# Patient Record
Sex: Female | Born: 1975 | Race: Black or African American | Hispanic: No | Marital: Single | Smoking: Former smoker
Health system: Southern US, Community
[De-identification: ages and names within clinical notes are randomized; demographics above are authoritative.]

## PROBLEM LIST (undated history)

## (undated) DIAGNOSIS — E785 Hyperlipidemia, unspecified: Secondary | ICD-10-CM

## (undated) DIAGNOSIS — E119 Type 2 diabetes mellitus without complications: Secondary | ICD-10-CM

## (undated) HISTORY — DX: Type 2 diabetes mellitus without complications: E11.9

## (undated) HISTORY — DX: Hyperlipidemia, unspecified: E78.5

## (undated) HISTORY — PX: KNEE ARTHROCENTESIS: SHX991

---

## 2001-07-25 ENCOUNTER — Other Ambulatory Visit: Admission: RE | Admit: 2001-07-25 | Discharge: 2001-07-25 | Payer: Self-pay | Admitting: Obstetrics & Gynecology

## 2002-08-18 ENCOUNTER — Other Ambulatory Visit: Admission: RE | Admit: 2002-08-18 | Discharge: 2002-08-18 | Payer: Self-pay | Admitting: Obstetrics & Gynecology

## 2002-08-25 ENCOUNTER — Ambulatory Visit (HOSPITAL_COMMUNITY): Admission: RE | Admit: 2002-08-25 | Discharge: 2002-08-25 | Payer: Self-pay | Admitting: Obstetrics & Gynecology

## 2002-08-25 ENCOUNTER — Encounter (INDEPENDENT_AMBULATORY_CARE_PROVIDER_SITE_OTHER): Payer: Self-pay | Admitting: *Deleted

## 2002-10-18 ENCOUNTER — Other Ambulatory Visit: Admission: RE | Admit: 2002-10-18 | Discharge: 2002-10-18 | Payer: Self-pay | Admitting: Obstetrics & Gynecology

## 2003-12-17 ENCOUNTER — Ambulatory Visit: Payer: Self-pay | Admitting: Obstetrics and Gynecology

## 2003-12-17 ENCOUNTER — Inpatient Hospital Stay (HOSPITAL_COMMUNITY): Admission: AD | Admit: 2003-12-17 | Discharge: 2003-12-20 | Payer: Self-pay | Admitting: Obstetrics and Gynecology

## 2004-01-03 ENCOUNTER — Ambulatory Visit: Payer: Self-pay | Admitting: Obstetrics and Gynecology

## 2005-06-14 ENCOUNTER — Emergency Department (HOSPITAL_COMMUNITY): Admission: EM | Admit: 2005-06-14 | Discharge: 2005-06-14 | Payer: Self-pay | Admitting: Emergency Medicine

## 2005-06-17 ENCOUNTER — Encounter: Admission: RE | Admit: 2005-06-17 | Discharge: 2005-06-17 | Payer: Self-pay | Admitting: Sports Medicine

## 2005-10-14 ENCOUNTER — Emergency Department (HOSPITAL_COMMUNITY): Admission: EM | Admit: 2005-10-14 | Discharge: 2005-10-14 | Payer: Self-pay | Admitting: Emergency Medicine

## 2008-03-14 ENCOUNTER — Inpatient Hospital Stay (HOSPITAL_COMMUNITY): Admission: AD | Admit: 2008-03-14 | Discharge: 2008-03-16 | Payer: Self-pay | Admitting: Obstetrics and Gynecology

## 2008-03-23 ENCOUNTER — Ambulatory Visit: Payer: Self-pay | Admitting: Obstetrics and Gynecology

## 2008-03-28 ENCOUNTER — Ambulatory Visit (HOSPITAL_COMMUNITY): Admission: RE | Admit: 2008-03-28 | Discharge: 2008-03-28 | Payer: Self-pay | Admitting: Obstetrics & Gynecology

## 2008-04-02 ENCOUNTER — Inpatient Hospital Stay (HOSPITAL_COMMUNITY): Admission: RE | Admit: 2008-04-02 | Discharge: 2008-04-09 | Payer: Self-pay | Admitting: Obstetrics

## 2008-04-06 ENCOUNTER — Encounter (INDEPENDENT_AMBULATORY_CARE_PROVIDER_SITE_OTHER): Payer: Self-pay | Admitting: Obstetrics & Gynecology

## 2010-03-10 ENCOUNTER — Ambulatory Visit: Payer: Self-pay | Admitting: Licensed Clinical Social Worker

## 2010-03-18 ENCOUNTER — Ambulatory Visit: Payer: Self-pay | Admitting: Licensed Clinical Social Worker

## 2010-03-25 ENCOUNTER — Ambulatory Visit: Payer: Self-pay | Admitting: Licensed Clinical Social Worker

## 2010-04-01 ENCOUNTER — Ambulatory Visit: Payer: Self-pay | Admitting: Licensed Clinical Social Worker

## 2010-04-08 ENCOUNTER — Ambulatory Visit: Payer: Self-pay | Admitting: Licensed Clinical Social Worker

## 2010-04-15 ENCOUNTER — Ambulatory Visit
Admission: RE | Admit: 2010-04-15 | Discharge: 2010-04-15 | Payer: Self-pay | Source: Home / Self Care | Attending: Licensed Clinical Social Worker | Admitting: Licensed Clinical Social Worker

## 2010-04-22 ENCOUNTER — Ambulatory Visit
Admission: RE | Admit: 2010-04-22 | Discharge: 2010-04-22 | Payer: Self-pay | Source: Home / Self Care | Attending: Licensed Clinical Social Worker | Admitting: Licensed Clinical Social Worker

## 2010-04-30 ENCOUNTER — Ambulatory Visit
Admission: RE | Admit: 2010-04-30 | Discharge: 2010-04-30 | Payer: Self-pay | Source: Home / Self Care | Attending: Licensed Clinical Social Worker | Admitting: Licensed Clinical Social Worker

## 2010-05-07 ENCOUNTER — Ambulatory Visit
Admission: RE | Admit: 2010-05-07 | Discharge: 2010-05-07 | Payer: Self-pay | Source: Home / Self Care | Attending: Licensed Clinical Social Worker | Admitting: Licensed Clinical Social Worker

## 2010-05-20 ENCOUNTER — Ambulatory Visit: Admit: 2010-05-20 | Payer: Self-pay | Admitting: Licensed Clinical Social Worker

## 2010-05-28 ENCOUNTER — Ambulatory Visit (INDEPENDENT_AMBULATORY_CARE_PROVIDER_SITE_OTHER): Payer: 59 | Admitting: Licensed Clinical Social Worker

## 2010-05-28 DIAGNOSIS — F411 Generalized anxiety disorder: Secondary | ICD-10-CM

## 2010-05-28 DIAGNOSIS — F331 Major depressive disorder, recurrent, moderate: Secondary | ICD-10-CM

## 2010-06-03 ENCOUNTER — Ambulatory Visit: Payer: 59 | Admitting: Licensed Clinical Social Worker

## 2010-06-03 DIAGNOSIS — F331 Major depressive disorder, recurrent, moderate: Secondary | ICD-10-CM

## 2010-06-03 DIAGNOSIS — F411 Generalized anxiety disorder: Secondary | ICD-10-CM

## 2010-06-11 ENCOUNTER — Ambulatory Visit (INDEPENDENT_AMBULATORY_CARE_PROVIDER_SITE_OTHER): Payer: 59 | Admitting: Licensed Clinical Social Worker

## 2010-06-11 DIAGNOSIS — F411 Generalized anxiety disorder: Secondary | ICD-10-CM

## 2010-06-11 DIAGNOSIS — F331 Major depressive disorder, recurrent, moderate: Secondary | ICD-10-CM

## 2010-06-24 ENCOUNTER — Ambulatory Visit (INDEPENDENT_AMBULATORY_CARE_PROVIDER_SITE_OTHER): Payer: 59 | Admitting: Licensed Clinical Social Worker

## 2010-06-24 DIAGNOSIS — F331 Major depressive disorder, recurrent, moderate: Secondary | ICD-10-CM

## 2010-06-24 DIAGNOSIS — F411 Generalized anxiety disorder: Secondary | ICD-10-CM

## 2010-07-08 ENCOUNTER — Ambulatory Visit (INDEPENDENT_AMBULATORY_CARE_PROVIDER_SITE_OTHER): Payer: 59 | Admitting: Licensed Clinical Social Worker

## 2010-07-08 DIAGNOSIS — F411 Generalized anxiety disorder: Secondary | ICD-10-CM

## 2010-07-08 DIAGNOSIS — F331 Major depressive disorder, recurrent, moderate: Secondary | ICD-10-CM

## 2010-07-15 ENCOUNTER — Other Ambulatory Visit: Payer: Self-pay | Admitting: Obstetrics and Gynecology

## 2010-09-02 NOTE — Op Note (Signed)
Angelica Berg, Angelica Berg              ACCOUNT NO.:  [id number]   MEDICAL RECORD NO.:  [id number]          PATIENT TYPE:  INP   LOCATION:  9319                          FACILITY:  [facility]   PHYSICIAN:  [name], M.D.DATE OF BIRTH:  1975/10/06   DATE OF PROCEDURE:  DATE OF DISCHARGE:                               OPERATIVE REPORT   PREOPERATIVE DIAGNOSES:  A 31 weeks and 6 days gestation twin,  diamniotic, dichorionic preterm rupture of membranes, preterm labor,  intrauterine growth restriction baby B.  Presentation breech and  transverse.   POSTOPERATIVE DIAGNOSES:  A 31 weeks and 6 days gestation twin,  diamniotic, dichorionic preterm rupture of membranes, preterm labor,  intrauterine growth restriction baby B.  Presentation breech and  transverse.   PROCEDURE:  Urgent primary low transverse cesarean section.   SURGEON:  [name], MD   ASSISTANT:  Eliberto Ivory. Rosalio Macadamia, MD   ANESTHESIOLOGIST:  Angelica Pou, MD   DESCRIPTION OF PROCEDURE:  Under spinal anesthesia, the patient is in 15  degree left decubitus position.  She is prepped with Betadine on the  abdominal, suprapubic, vulvar and vaginal areas.  The Foley is put in  place in the bladder and the patient is draped as usual.  The level of  anesthesia is verified and is adequate.  We infiltrate the subcutaneous  tissue with Marcaine one-quarter plain 10 mL at the site of the  Pfannenstiel.  We make a Pfannenstiel with the scalpel.  We opened the  adipose tissue with the electrocautery, cutting mode.  We assured  hemostasis with the electrocautery coag mode.  We opened the aponeurosis  transversely with the electrocautery and complete on each side with Mayo  scissors.  We separate the recti muscles from the aponeurosis on the  midline superiorly and inferiorly.  We opened the parietal peritoneum  bluntly with the finger.  We then put the bladder retractor in place.  We opened the visceral peritoneum with  Metzenbaum scissors.  We reclined  the bladder downward, repositioned the bladder retractor.  We have a  good lower uterine segment, therefore we make a low-transverse  hysterotomy with the scalpel, extension on each side with dressing  scissors.  Birth of baby A in breech presentation, it is a baby girl.  The cord is clamped and cut, the baby is suctioned and given to the  neonatal team.  Apgars are 7 and 9.  Birth time 9:36, pH is pending.  The second twin is in transverse presentation.  We feel for the feet and  once they are grasped, the membranes are ruptured.  Birth of baby B in  breech presentation, it is a baby girl born at 9:38.  The cord is  clamped and cut, the baby is suctioned and given to the neonatal team.  Apgars are 5 and 9 and the pH is pending.  We then extract the placenta  manually after taking the cord blood.  We sent the placenta to  Pathology.  Uterine revision is done.  Pitocin is started and the IV  fluids and the uterus contracts well.  It is  normal in size and  appearance.  Both tubes and both ovaries are normal in appearance.  We  closed the hysterotomy in a first locked running suture of Vicryl 0 and  a second layer in a mattress fashion is done with Vicryl 0.  A figure-of-  eight on the right angle is added to complete hemostasis.  Hemostasis is  adequate at all levels on the uterus.  We complete hemostasis with the  electrocautery coag mode on the recti muscles and around the bladder  area.  We irrigated and suctioned the abdominopelvic cavities.  We then  closed the aponeurosis in two half running sutures of Vicryl zero.  We  complete hemostasis on the adipose tissue with the electrocautery.  We  do separate stitches of plain 3-0 on the adipose tissue  and reapproximate the skin with staples.  The counts of sponges and  instrument was complete x2.  A compressive dressing is put on the  incision.  The estimated blood loss was 600 mL.  No complications   occurred and the patient was brought to recovery room in good stable  status.  Note that a dose of Flagyl IV was given after cord clamping.      [name], M.D.  Electronically Signed     ML/MEDQ  D:  04/06/2008  T:  04/07/2008  Job:  [id number]

## 2010-09-05 NOTE — Op Note (Signed)
   NAME:  Angelica Berg, Angelica Berg                        ACCOUNT NO.:  [id number]   MEDICAL RECORD NO.:  [id number]                   PATIENT TYPE:  SPE   LOCATION:  DFTL                                 FACILITY:  [facility]   PHYSICIAN:  [name], M.D.             DATE OF BIRTH:  05/20/1975   DATE OF PROCEDURE:  08/25/2002  DATE OF DISCHARGE:  08/18/2002                                 OPERATIVE REPORT   PREOPERATIVE DIAGNOSES:  1. Intrauterine pregnancy at 6+ weeks.  2. Incomplete spontaneous abortion.   POSTOPERATIVE DIAGNOSES:  1. Intrauterine pregnancy at 6+ weeks.  2. Incomplete spontaneous abortion.   PROCEDURE:  Dilatation and evacuation.   SURGEON:  [name], M.D.   ANESTHESIOLOGIST:  Raul Del, M.D.   DESCRIPTION OF PROCEDURE:  Under general anesthesia with O2 mask, the  patient was in the supine position.  She was prepped with Betadine in the  suprapubic, vulvar and vaginal areas and draped as usual.  The vaginal exam  revealed a retroverted uterus about 7 cm, no adnexal mass.  The cervix is  fingertip and long.  No vaginal bleeding currently.  The speculum was  inserted.  The anterior lip of the cervix was grasped with a tenaculum.  Dilatation was done with Hegar dilators up to #33 easily.  A #9 curet was  used for suction.  Products of conception corresponding to about 7 weeks'  gestation are present and sent to pathology.  The uterus contracts well.  The intrauterine cavity was then curetted with the sharp curet over all  surfaces, this one particularly.  Hemostasis is adequate.  The suction curet  is used to remove any blood clot.  The instruments are all removed.  No  complications occurred, and the patient was brought back to the recovery  room in good status.  Her blood group is B positive.                                               [name], M.D.    ML/MEDQ  D:  08/25/2002  T:  08/27/2002  Job:  [id number]

## 2010-09-05 NOTE — Discharge Summary (Signed)
NAME:  Angelica Berg, Angelica Berg                        ACCOUNT NO.:  [id number]   MEDICAL RECORD NO.:  [id number]                   PATIENT TYPE:  INP   LOCATION:  9309                                 FACILITY:  [facility]   PHYSICIAN:  Phil D. Okey Dupre, M.D.                  DATE OF BIRTH:  [birth year]   DATE OF ADMISSION:  12/17/2003  DATE OF DISCHARGE:  12/20/2003                                 DISCHARGE SUMMARY   ADMISSION DIAGNOSES:  1.  Fever.  2.  Pelvic tenderness.   DISCHARGE DIAGNOSES:  1.  Questionable pelvic inflammatory disease with positive chlamydia and      free fluid on ultrasound.  2.  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   DISCHARGE MEDICATIONS:  1.  Doxycycline 100 mg b.i.d. x10 days.  2.  Acyclovir 400 mg t.i.d. x10 days.  3.  Tylenol 650 mg q.4-6h. p.r.n.   ADMISSION HISTORY:  [name] presented to maternity admissions after being  seen in STD clinic where she was concerned about sexually-transmitted  diseases.  On exam during the STD clinic she was noted to have some  tenderness and was sent to maternity admissions.  On presenting to maternity  admissions she had a fever to 102 with some pelvic tenderness.  Her cervix  was noted to be friable.  Gonorrhea, chlamydia, and HSV cultures were done.  Chlamydia and HSV were both noted to be positive.  The patient remained  febrile on Zosyn for 24 hours.  She was changed from Zosyn to ampicillin,  gentamycin, and clindamycin.  She still continued to spike fevers to 101 for  the first 12 hours.  Valtrex was then added to that regimen after her HSV  became positive and she defervesced.  The last 24 hours prior to discharge  she had one low-grade temperature of 100.7 and remained afebrile following  that.   On day of discharge she had no tenderness and was tolerating p.o. well.   CONDITION ON DISCHARGE:  The patient was discharged to home in stable  condition.   INSTRUCTIONS GIVEN TO PATIENT:  The  patient was told of her above medical  regimen.  She will be set up with the GYN clinic for an appointment prior to  her discharge.   Of note, her white blood cell count was 3.4 on the day prior to discharge.  An HIV was checked which was negative.  She also had hepatitis panels which  are pending on day of discharge and these need to be followed up in the GYN  clinic.     Jon Gills, M.D.                     Phil D. Okey Dupre, M.D.   LC/MEDQ  D:  12/20/2003  T:  12/21/2003  Job:  [id number]   cc:   [facility] Dr. Okey Dupre at [facility]

## 2010-09-05 NOTE — Discharge Summary (Signed)
NAMEELIOT, BENCIVENGA              ACCOUNT NO.:  [id number]   MEDICAL RECORD NO.:  [id number]           PATIENT TYPE:   LOCATION:                                 FACILITY:   PHYSICIAN:  [name], M.D.DATE OF BIRTH:  11-06-1975   DATE OF ADMISSION:  03/27/2008  DATE OF DISCHARGE:  04/09/2008                               DISCHARGE SUMMARY   ADMISSION DIAGNOSES:  A 31 weeks and 6 days twin, diamniotic-dichorionic  with preterm rupture of membranes, preterm labor, and intrauterine  growth restriction on baby B.   DISCHARGE DIAGNOSES:  A 31 weeks and 6 days twin, diamniotic-dichorionic  with preterm rupture of membranes, preterm labor, and intrauterine  growth restriction on baby B.  Presentation was breech and transverse.   PROCEDURE:  Urgent primary low transverse cesarean section.   HOSPITAL COURSE:  The patient had 2 girls.  Apgars were 7 and 9 and 5  and 9.  Estimated blood loss was 600 mL.  No complications occurred  during the surgery.  The patient had a good postop evaluation.  She was  discharged in stable status on April 09, 2008.  On postop day #3, she  was prescribed Tylox p.r.n.  Postop advice were given.  She will follow  up at [facility] in 6 weeks.  Her postop hemoglobin was 11.6.      [name], M.D.  Electronically Signed     ML/MEDQ  D:  05/31/2008  T:  06/01/2008  Job:  [id number]

## 2011-01-19 ENCOUNTER — Emergency Department (HOSPITAL_COMMUNITY): Payer: Self-pay

## 2011-01-19 ENCOUNTER — Emergency Department (HOSPITAL_COMMUNITY)
Admission: EM | Admit: 2011-01-19 | Discharge: 2011-01-19 | Disposition: A | Discharge status: Home / Self Care | Payer: Self-pay | Attending: Emergency Medicine | Admitting: Emergency Medicine

## 2011-01-19 DIAGNOSIS — R229 Localized swelling, mass and lump, unspecified: Secondary | ICD-10-CM | POA: Insufficient documentation

## 2011-01-19 DIAGNOSIS — M25579 Pain in unspecified ankle and joints of unspecified foot: Secondary | ICD-10-CM | POA: Insufficient documentation

## 2011-01-19 DIAGNOSIS — W108XXA Fall (on) (from) other stairs and steps, initial encounter: Secondary | ICD-10-CM | POA: Insufficient documentation

## 2011-01-20 LAB — CREATININE CLEARANCE, URINE, 24 HOUR
Collection Interval-CRCL: 24
Creatinine Clearance: 223 — ABNORMAL HIGH
Creatinine, 24H Ur: 1509

## 2011-01-20 LAB — CBC
HCT: 36.4
HCT: 36.9
Hemoglobin: 12.4
Hemoglobin: 12.6
MCHC: 34.2
MCV: 97
MCV: 97.3
Platelets: 223
RBC: 3.74 — ABNORMAL LOW
RBC: 3.81 — ABNORMAL LOW
RDW: 13.9
WBC: 9.9

## 2011-01-20 LAB — COMPREHENSIVE METABOLIC PANEL
BUN: 3 — ABNORMAL LOW
CO2: 24
Calcium: 9.1
Creatinine, Ser: 0.47
GFR calc non Af Amer: >60
Glucose, Bld: 108 — ABNORMAL HIGH
Total Protein: 6.1

## 2011-01-20 LAB — LACTATE DEHYDROGENASE: LDH: 151

## 2011-01-20 LAB — PROTEIN, URINE, 24 HOUR
Protein, 24H Urine: 195 — ABNORMAL HIGH
Protein, Urine: 15
Urine Total Volume-UPROT: 1300

## 2011-01-20 LAB — GLUCOSE, 1 HOUR GESTATIONAL: Glucose Tolerance, 1 hour: 166

## 2011-01-20 LAB — GLUCOSE, 2 HOUR GESTATIONAL: Glucose Tolerance, 2 hour: 159

## 2011-01-20 LAB — URIC ACID: Uric Acid, Serum: 4.5

## 2011-01-22 LAB — TYPE AND SCREEN
ABO/RH(D): B POS
Antibody Screen: NEGATIVE

## 2011-01-22 LAB — COMPREHENSIVE METABOLIC PANEL
ALT: 16 U/L (ref 0–35)
Albumin: 2.7 g/dL — ABNORMAL LOW (ref 3.5–5.2)
Alkaline Phosphatase: 80 U/L (ref 39–117)
BUN: 3 mg/dL — ABNORMAL LOW (ref 6–23)
CO2: 27 mEq/L (ref 19–32)
Calcium: 8.4 mg/dL (ref 8.4–10.5)
Calcium: 8.8 mg/dL (ref 8.4–10.5)
GFR calc non Af Amer: >60 mL/min (ref >60)
Glucose, Bld: 109 mg/dL — ABNORMAL HIGH (ref 70–99)
Glucose, Bld: 119 mg/dL — ABNORMAL HIGH (ref 70–99)
Potassium: 3.3 mEq/L — ABNORMAL LOW (ref 3.5–5.1)
Potassium: 3.6 mEq/L (ref 3.5–5.1)
Sodium: 131 mEq/L — ABNORMAL LOW (ref 135–145)
Sodium: 133 mEq/L — ABNORMAL LOW (ref 135–145)
Total Protein: 6.3 g/dL (ref 6.0–8.3)

## 2011-01-22 LAB — DIFFERENTIAL
Basophils Relative: 0 % (ref 0–1)
Lymphocytes Relative: 33 % (ref 12–46)
Monocytes Relative: 8 % (ref 3–12)
Neutro Abs: 5.2 10*3/uL (ref 1.7–7.7)
Neutrophils Relative %: 58 % (ref 43–77)

## 2011-01-22 LAB — PROTEIN, URINE, 24 HOUR
Collection Interval-UPROT: 24 hours
Protein, 24H Urine: 193 mg/d — ABNORMAL HIGH (ref 50–100)
Protein, Urine: 14 mg/dL

## 2011-01-22 LAB — CBC
HCT: 37.6 % (ref 36.0–46.0)
Hemoglobin: 11.6 g/dL — ABNORMAL LOW (ref 12.0–15.0)
Hemoglobin: 13 g/dL (ref 12.0–15.0)
MCHC: 34.1 g/dL (ref 30.0–36.0)
MCHC: 34.2 g/dL (ref 30.0–36.0)
MCHC: 34.5 g/dL (ref 30.0–36.0)
Platelets: 192 10*3/uL (ref 150–400)
RBC: 3.47 MIL/uL — ABNORMAL LOW (ref 3.87–5.11)
RBC: 4.05 MIL/uL (ref 3.87–5.11)
RDW: 13.7 % (ref 11.5–15.5)
WBC: 8.9 10*3/uL (ref 4.0–10.5)

## 2011-01-22 LAB — ABO/RH: ABO/RH(D): B POS

## 2011-01-22 LAB — CREATININE CLEARANCE, URINE, 24 HOUR
Creatinine, 24H Ur: 1561 mg/d (ref 700–1800)
Urine Total Volume-CRCL: 1375 mL

## 2011-01-22 LAB — URIC ACID: Uric Acid, Serum: 5.4 mg/dL (ref 2.4–7.0)

## 2011-10-12 ENCOUNTER — Emergency Department (INDEPENDENT_AMBULATORY_CARE_PROVIDER_SITE_OTHER)
Admission: EM | Admit: 2011-10-12 | Discharge: 2011-10-12 | Disposition: A | Discharge status: Home / Self Care | Payer: Self-pay | Source: Home / Self Care | Attending: Emergency Medicine | Admitting: Emergency Medicine

## 2011-10-12 ENCOUNTER — Encounter (HOSPITAL_COMMUNITY): Payer: Self-pay

## 2011-10-12 DIAGNOSIS — N760 Acute vaginitis: Secondary | ICD-10-CM

## 2011-10-12 DIAGNOSIS — R5383 Other fatigue: Secondary | ICD-10-CM

## 2011-10-12 DIAGNOSIS — N730 Acute parametritis and pelvic cellulitis: Secondary | ICD-10-CM

## 2011-10-12 DIAGNOSIS — B9689 Other specified bacterial agents as the cause of diseases classified elsewhere: Secondary | ICD-10-CM

## 2011-10-12 DIAGNOSIS — M778 Other enthesopathies, not elsewhere classified: Secondary | ICD-10-CM

## 2011-10-12 DIAGNOSIS — A499 Bacterial infection, unspecified: Secondary | ICD-10-CM

## 2011-10-12 LAB — POCT I-STAT, CHEM 8
Calcium, Ion: 1.2 mmol/L (ref 1.12–1.32)
Glucose, Bld: 111 mg/dL — ABNORMAL HIGH (ref 70–99)
HCT: 47 % — ABNORMAL HIGH (ref 36.0–46.0)
Hemoglobin: 16 g/dL — ABNORMAL HIGH (ref 12.0–15.0)

## 2011-10-12 LAB — POCT URINALYSIS DIP (DEVICE)
Leukocytes, UA: NEGATIVE
Nitrite: NEGATIVE
Protein, ur: NEGATIVE mg/dL
pH: 7 (ref 5.0–8.0)

## 2011-10-12 MED ORDER — AZITHROMYCIN 250 MG PO TABS
1000.0000 mg | ORAL_TABLET | Freq: Once | ORAL | Status: AC
  Administered 2011-10-12: 1000 mg via ORAL

## 2011-10-12 MED ORDER — LIDOCAINE HCL (PF) 1 % IJ SOLN
INTRAMUSCULAR | Status: AC
  Filled 2011-10-12: qty 5

## 2011-10-12 MED ORDER — CEFTRIAXONE SODIUM 250 MG IJ SOLR
INTRAMUSCULAR | Status: AC
  Filled 2011-10-12: qty 250

## 2011-10-12 MED ORDER — MELOXICAM 15 MG PO TABS: 15.0000 mg | ORAL_TABLET | Freq: Every day | ORAL | Status: AC

## 2011-10-12 MED ORDER — AZITHROMYCIN 250 MG PO TABS
ORAL_TABLET | ORAL | Status: AC
  Filled 2011-10-12: qty 4

## 2011-10-12 MED ORDER — METRONIDAZOLE 500 MG PO TABS: 500.0000 mg | ORAL_TABLET | Freq: Three times a day (TID) | ORAL | Status: AC

## 2011-10-12 MED ORDER — CEFTRIAXONE SODIUM 250 MG IJ SOLR
250.0000 mg | Freq: Once | INTRAMUSCULAR | Status: AC
  Administered 2011-10-12: 250 mg via INTRAMUSCULAR

## 2011-10-12 NOTE — ED Notes (Signed)
States she had intercourse after ending menses, then menses started again; c/o she feels bad, having lower abdominal area pain; febrile; c/o pain in right shoulder

## 2011-10-12 NOTE — ED Provider Notes (Signed)
Chief Complaint  Patient presents with  . Abdominal Pain    History of Present Illness:    The patient is a 36 year old female with a two-week history of bilateral lower abdominal pain. This would come and go and was rated 5/10 in intensity. Nothing particular made it worse it was relieved by Aleve. The patient notes anorexia and nausea but no vomiting, fever, or chills. She has had some discharge the past couple days which is clear to yellow little bit of blood. She has some odor. She has a history of bacterial vaginosis and herpes simplex virus. She notes her menses have been irregular. Her last menstrual period began Saturday a week ago and lasted 5 days. It was heavy with clotting and cramping she is sexually active and uses condoms occasionally but not consistently. The past 2 weeks she's felt tired and rundown and hasn't had much energy. She also has noted a knot on her right shoulder with radiation of pain into her upper back and into her right breast.  Review of Systems:  Other than noted above, the patient denies any of the following symptoms: Constitutional:  No fever, chills, fatigue, weight loss or anorexia. Lungs:  No cough or shortness of breath. Heart:  No chest pain, palpitations, syncope or edema.  No cardiac history. Abdomen:  No nausea, vomiting, hematememesis, melena, diarrhea, or hematochezia. GU:  No dysuria, frequency, urgency, or hematuria. Gyn:  No vaginal discharge, itching, abnormal bleeding, dyspareunia, or pelvic pain. Skin:  No rash or itching.  PMFSH:  Past medical history, family history, social history, meds, and allergies were reviewed along with nurse's notes.  No prior abdominal surgeries, past history of GI problems, STDs or GYN problems.  No history of aspirin or NSAID use.  No excessive alcohol intake.  Physical Exam:   Vital signs:  BP 135/85  Pulse 78  Temp 99.2 F (37.3 C) (Oral)  Resp 16  SpO2 98% Gen:  Alert, oriented, in no distress. Lungs:   Breath sounds clear and equal bilaterally.  No wheezes, rales or rhonchi. Heart:  Regular rhythm.  No gallops or murmers.   Abdomen:  Soft, flat, nontender and nondistended. She has mild tenderness to palpation in both lower quadrants without guarding or rebound. No organomegaly or mass. Bowel sounds are normally active. Pelvic:  Normal external genitalia, vaginal and cervical mucosa are normal. There is a small amount of whitish discharge. She has moderate cervical motion tenderness. Uterus is midposition and normal in size and shape. She has moderate uterine tenderness. She is moderate bilateral adnexal tenderness without any masses. Skin:  Clear, warm and dry.  No rash.  Labs:   Results for orders placed during the hospital encounter of 10/12/11  WET PREP, GENITAL      Component Value Range   Yeast Wet Prep HPF POC NONE SEEN  NONE SEEN   Trich, Wet Prep NONE SEEN  NONE SEEN   Clue Cells Wet Prep HPF POC MODERATE (*) NONE SEEN   WBC, Wet Prep HPF POC FEW (*) NONE SEEN  POCT URINALYSIS DIP (DEVICE)      Component Value Range   Glucose, UA NEGATIVE  NEGATIVE mg/dL   Bilirubin Urine SMALL (*) NEGATIVE   Ketones, ur NEGATIVE  NEGATIVE mg/dL   Specific Gravity, Urine 1.020  1.005 - 1.030   Hgb urine dipstick NEGATIVE  NEGATIVE   pH 7.0  5.0 - 8.0   Protein, ur NEGATIVE  NEGATIVE mg/dL   Urobilinogen, UA 0.2  0.0 -  1.0 mg/dL   Nitrite NEGATIVE  NEGATIVE   Leukocytes, UA NEGATIVE  NEGATIVE  POCT PREGNANCY, URINE      Component Value Range   Preg Test, Ur NEGATIVE  NEGATIVE  POCT I-STAT, CHEM 8      Component Value Range   Sodium 142  135 - 145 mEq/L   Potassium 3.8  3.5 - 5.1 mEq/L   Chloride 100  96 - 112 mEq/L   BUN 5 (*) 6 - 23 mg/dL   Creatinine, Ser 8.11  0.50 - 1.10 mg/dL   Glucose, Bld 914 (*) 70 - 99 mg/dL   Calcium, Ion 7.82  1.12 - 1.32 mmol/L   TCO2 28  0 - 100 mmol/L   Hemoglobin 16.0 (*) 12.0 - 15.0 g/dL   HCT 95.6 (*) 21.3 - 08.6 %    Other Labs Obtained at Urgent  Care Center:  GC and Chlamydia DNA probes were obtained.  Results are pending at this time and we will call about any positive results.  Course in Urgent Care Center:   She was given Rocephin 250 mg IM and azithromycin 1000 mg by mouth and tolerated these well without any immediate side effects.  Assessment:  The primary encounter diagnosis was PID (acute pelvic inflammatory disease). Diagnoses of Fatigue, Bacterial vaginosis, and Tendonitis of shoulder were also pertinent to this visit.  Plan:   1.  The following meds were prescribed:   New Prescriptions   MELOXICAM (MOBIC) 15 MG TABLET    Take 1 tablet (15 mg total) by mouth daily.   METRONIDAZOLE (FLAGYL) 500 MG TABLET    Take 1 tablet (500 mg total) by mouth 3 (three) times daily.   2.  The patient was instructed in symptomatic care and handouts were given. 3.  The patient was told to return if becoming worse in any way, if no better in 3 or 4 days, and given some red flag symptoms that would indicate earlier return.  Follow up:  The patient was told to follow up with Dr.Varnado in one week.     Reuben Likes, MD 10/12/11 [phone]

## 2011-10-12 NOTE — Discharge Instructions (Signed)
Pelvic Inflammatory Disease  Pelvic Inflammatory Disease (PID) is an infection in some or all of your female organs. This includes the womb (uterus), ovaries, fallopian tubes and tissues in the pelvis. PID is a common cause of sudden onset (acute) lower abdominal (pelvic) pain. PID can be treated, but it is a serious infection. It may take weeks before you are completely well. In some cases, hospitalization is needed for surgery or to administer medications to kill germs (antibiotics) through your veins (intravenously).  CAUSES    It may be caused by germs that are spread during sexual contact.   PID can also occur following:   The birth of a baby.   A miscarriage.   An abortion.   Major surgery of the pelvis.   Use of an IUD.   Sexual assault.  SYMPTOMS    Abdominal or pelvic pain.   Fever.   Chills.   Abnormal vaginal discharge.  DIAGNOSIS   Your caregiver will choose some of these methods to make a diagnosis:   A physical exam and history.   Blood tests.   Cultures of the vagina and cervix.   X-rays or ultrasound.   A procedure to look inside the pelvis (laparoscopy).  TREATMENT    Use of antibiotics by mouth or intravenously.   Treatment of sexual partners when the infection is an sexually transmitted disease (STD).   Hospitalization and surgery may be needed.  RISKS AND COMPLICATIONS    PID can cause women to become unable to have children (sterile) if left untreated or if partially treated. That is why it is important to finish all medications given to you.   Sterility or future tubal (ectopic) pregnancies can occur in fully treated individuals. This is why it is so important to follow your prescribed treatment.   It can cause longstanding (chronic) pelvic pain after frequent infections.   Painful intercourse.   Pelvic abscesses.   In rare cases, surgery or a hysterectomy may be needed.   If this is a sexually transmitted infection (STI), you are also at risk for any other STD  including AIDSor human papillomavirus (HPV).  HOME CARE INSTRUCTIONS    Finish all medication as prescribed. Incomplete treatment will put you at risk for sterility and tubal pregnancy.   Only take over-the-counter or prescription medicines for pain, discomfort, or fever as directed by your caregiver.   Do not have sex until treatment is completed or as directed by your caregiver. If PID is confirmed, your recent sexual contacts will need treatment.   Keep your follow-up appointments.  SEEK MEDICAL CARE IF:    You have increased or abnormal vaginal discharge.   You need prescription medication for your pain.   Your partner has an STD.   You are vomiting.   You cannot take your medications.  SEEK IMMEDIATE MEDICAL CARE IF:    You have a fever.   You develop increased abdominal or pelvic pain.   You develop chills.   You have pain when you urinate.   You are not better after 72 hours following treatment.  Document Released: 04/06/2005 Document Revised: 03/26/2011 Document Reviewed: 12/18/2006  [employer] Patient Information 2012 [employer].

## 2011-10-13 LAB — URINE CULTURE
Colony Count: 65000
Culture  Setup Time: 201306241333

## 2011-10-13 LAB — GC/CHLAMYDIA PROBE AMP, GENITAL
Chlamydia, DNA Probe: NEGATIVE
GC Probe Amp, Genital: NEGATIVE

## 2011-10-13 NOTE — ED Notes (Signed)
GC/Chlamydia neg., Wet prep: Mod. clue cells, few WBC's, Urine culture: 65,000 colonies multiple bacterial types none predominant.   Pt. adequately treated with Flagyl. Vassie Moselle 10/13/2011

## 2018-08-09 ENCOUNTER — Other Ambulatory Visit: Payer: Self-pay

## 2018-08-11 ENCOUNTER — Encounter: Payer: Self-pay | Admitting: Obstetrics & Gynecology

## 2018-08-11 ENCOUNTER — Other Ambulatory Visit: Payer: Self-pay

## 2018-08-11 ENCOUNTER — Ambulatory Visit (INDEPENDENT_AMBULATORY_CARE_PROVIDER_SITE_OTHER): Payer: PRIVATE HEALTH INSURANCE | Admitting: Obstetrics & Gynecology

## 2018-08-11 VITALS — BP 140/90 | Ht 69.5 in | Wt 256.0 lb

## 2018-08-11 DIAGNOSIS — N611 Abscess of the breast and nipple: Secondary | ICD-10-CM

## 2018-08-11 DIAGNOSIS — Z01419 Encounter for gynecological examination (general) (routine) without abnormal findings: Secondary | ICD-10-CM

## 2018-08-11 DIAGNOSIS — E6609 Other obesity due to excess calories: Secondary | ICD-10-CM | POA: Diagnosis not present

## 2018-08-11 DIAGNOSIS — L292 Pruritus vulvae: Secondary | ICD-10-CM

## 2018-08-11 DIAGNOSIS — Z1151 Encounter for screening for human papillomavirus (HPV): Secondary | ICD-10-CM | POA: Diagnosis not present

## 2018-08-11 DIAGNOSIS — N911 Secondary amenorrhea: Secondary | ICD-10-CM | POA: Diagnosis not present

## 2018-08-11 DIAGNOSIS — Z6837 Body mass index (BMI) 37.0-37.9, adult: Secondary | ICD-10-CM

## 2018-08-11 MED ORDER — FLUCONAZOLE 150 MG PO TABS: 150.0000 mg | ORAL_TABLET | Freq: Every day | ORAL | 3 refills | Status: AC

## 2018-08-11 MED ORDER — CEPHALEXIN 500 MG PO CAPS: 500.0000 mg | ORAL_CAPSULE | Freq: Three times a day (TID) | ORAL | 0 refills | Status: AC

## 2018-08-11 NOTE — Progress Notes (Signed)
Angelica Berg November 09, 1975 [id number]   History:    43 y.o. G2P1A1L2  Single.  Twin girls 43 yo.  RP:  New (>3 yrs) patient presenting for annual gyn exam   HPI: Amenorrhea x 8 months with hot flushes.  No pelvic pain.  Abstinent x >1 year.  Urine/BMs normal.  Left breast sore lump with redness x a few weeks.  Vulvar itching.  BMI 37.26.  Not exercising regularly.  Starting a low Calorie/Carb diet.  Fasting health labs here today.  Past medical history,surgical history, family history and social history were all reviewed and documented in the EPIC chart.  Gynecologic History No LMP recorded. (Menstrual status: Irregular Periods). Contraception: abstinence Last Pap: 06/2010 Negative Last mammogram: Never Bone Density: Never Colonoscopy: Never  Obstetric History OB History  Gravida Para Term Preterm AB Living  _0 SAB TAB Ectopic Multiple Live Births  1            # Outcome Date GA Lbr Len/2nd Weight Sex Delivery Anes PTL Lv  2 SAB           1 Para              ROS: A ROS was performed and pertinent positives and negatives are included in the history.  GENERAL: No fevers or chills. HEENT: No change in vision, no earache, sore throat or sinus congestion. NECK: No pain or stiffness. CARDIOVASCULAR: No chest pain or pressure. No palpitations. PULMONARY: No shortness of breath, cough or wheeze. GASTROINTESTINAL: No abdominal pain, nausea, vomiting or diarrhea, melena or bright red blood per rectum. GENITOURINARY: No urinary frequency, urgency, hesitancy or dysuria. MUSCULOSKELETAL: No joint or muscle pain, no back pain, no recent trauma. DERMATOLOGIC: No rash, no itching, no lesions. ENDOCRINE: No polyuria, polydipsia, no heat or cold intolerance. No recent change in weight. HEMATOLOGICAL: No anemia or easy bruising or bleeding. NEUROLOGIC: No headache, seizures, numbness, tingling or weakness. PSYCHIATRIC: No depression, no loss of interest in normal activity or change in  sleep pattern.     Exam:   BP 140/90    Ht 5' 9.5" (1.765 m)    Wt 256 lb (116.1 kg)    BMI 37.26 kg/m   Body mass index is 37.26 kg/m.  General appearance : Well developed well nourished female. No acute distress HEENT: Eyes: no retinal hemorrhage or exudates,  Neck supple, trachea midline, no carotid bruits, no thyroidmegaly Lungs: Clear to auscultation, no rhonchi or wheezes, or rib retractions  Heart: Regular rate and rhythm, no murmurs or gallops Breast:Examined in sitting and supine position.  Right breast with no palpable masses or tenderness,  no skin retraction, no nipple inversion, no nipple discharge, no skin discoloration, no axillary or supraclavicular lymphadenopathy.  Left breast: Large mass/abscess measuring about 4 x 5 cm at 7:00 with fluctuation and erythema of the skin.  After verbal consent, Betadine prep with local anesthesia using lidocaine 2%.  A 20 gauge needle with a syringe was used to partially drain the abscess.  After evacuating fluid and pus with the syringe, pressure was applied to expel more fluid and pus, but given that it was pretty tender, we stopped.  A dressing was applied. Abdomen: no palpable masses or tenderness, no rebound or guarding Extremities: no edema or skin discoloration or tenderness  Pelvic: Vulva: Normal             Vagina: No gross lesions or discharge  Cervix: No  gross lesions or discharge.  Pap/HPV HR done  Uterus  AV, normal size, shape and consistency, non-tender and mobile  Adnexa  Without masses or tenderness  Anus: Normal   Assessment/Plan:  43 y.o. female for annual exam   1. Encounter for routine gynecological examination with Papanicolaou smear of cervix Gynecologic exam with left breast abscess and yeast vaginitis/vulvitis.  Pap test with high-risk HPV done.  Breast exam with large left breast abscess.  Fasting health labs here today. - CBC - Comp Met (CMET) - TSH - VITAMIN D 25 Hydroxy (Vit-D Deficiency, Fractures) -  Lipid panel  2. Secondary amenorrhea Amenorrhea for 8 months associated with hot flushes.  Probable menopause.  Saline done today.  Even if menopause is diagnosed, patient does not want to start on hormone replacement therapy after counseling.  Recommended vitamin D supplements and calcium intake of 1200 to 1500 mg daily, as well as regular weight bearing physical activities.  If FSH is low and patient is not in menopause, will need to follow-up with a pelvic ultrasound to assess the endometrial lining.  May give a progestin for withdrawal bleeding prior to the pelvic ultrasound. - FSH  3. Left breast abscess Large left breast abscess at 7:00.  Drainage through a large needle done after Betadine prep and local anesthesia.  Dressing applied.  Patient started on Keflex 500 mg 3 times a day for 10 days.  We will proceed with a left diagnostic mammogram and ultrasound followed with drainage again as needed at that time.  Referred to general surgeon per evolution and findings.  4. Vulvar itching Clinical yeast vaginitis and vulvitis.  Will treat with fluconazole 150 mg/tab. 1 tablet daily for 3 days.  Usage reviewed and prescription sent to pharmacy.  Refills prescribed.  5. Class 2 obesity due to excess calories without serious comorbidity with body mass index (BMI) of 37.0 to 37.9 in adult Recommend a lower calorie/carb diet such as [employer].  Intermittent fasting discussed.  Aerobic physical activities 5 times a week and weightlifting every 2 days recommended.  Other orders - fluconazole (DIFLUCAN) 150 MG tablet; Take 1 tablet (150 mg total) by mouth daily for 3 days. - cephALEXin (KEFLEX) 500 MG capsule; Take 1 capsule (500 mg total) by mouth 3 (three) times daily for 10 days.  Counseling on above issues and coordination of care more than 50% for 20 minutes.  Princess Bruins MD, 9:56 AM 08/11/2018

## 2018-08-11 NOTE — Patient Instructions (Signed)
1. Encounter for routine gynecological examination with Papanicolaou smear of cervix Gynecologic exam with left breast abscess and yeast vaginitis/vulvitis.  Pap test with high-risk HPV done.  Breast exam with large left breast abscess.  Fasting health labs here today. - CBC - Comp Met (CMET) - TSH - VITAMIN D 25 Hydroxy (Vit-D Deficiency, Fractures) - Lipid panel  2. Secondary amenorrhea Amenorrhea for 8 months associated with hot flushes.  Probable menopause.  Lyle done today.  Even if menopause is diagnosed, patient does not want to start on hormone replacement therapy after counseling.  Recommended vitamin D supplements and calcium intake of 1200 to 1500 mg daily, as well as regular weight bearing physical activities.  If FSH is low and patient is not in menopause, will need to follow-up with a pelvic ultrasound to assess the endometrial lining.  May give a progestin for withdrawal bleeding prior to the pelvic ultrasound. - FSH  3. Left breast abscess Large left breast abscess at 7:00.  Drainage through a large needle done after Betadine prep and local anesthesia.  Dressing applied.  Patient started on Keflex 500 mg 3 times a day for 10 days.  We will proceed with a left diagnostic mammogram and ultrasound followed with drainage again as needed at that time.  Referred to general surgeon per evolution and findings.  4. Vulvar itching Clinical yeast vaginitis and vulvitis.  Will treat with fluconazole 150 mg/tab. 1 tablet daily for 3 days.  Usage reviewed and prescription sent to pharmacy.  Refills prescribed.  5. Class 2 obesity due to excess calories without serious comorbidity with body mass index (BMI) of 37.0 to 37.9 in adult Recommend a lower calorie/carb diet such as [employer].  Intermittent fasting discussed.  Aerobic physical activities 5 times a week and weightlifting every 2 days recommended.  Other orders - fluconazole (DIFLUCAN) 150 MG tablet; Take 1 tablet (150 mg total)  by mouth daily for 3 days. - cephALEXin (KEFLEX) 500 MG capsule; Take 1 capsule (500 mg total) by mouth 3 (three) times daily for 10 days.  Regla, it was a pleasure seeing you today!  I will inform you of your results as soon as they are available.

## 2018-08-12 ENCOUNTER — Telehealth: Payer: Self-pay | Admitting: *Deleted

## 2018-08-12 DIAGNOSIS — E559 Vitamin D deficiency, unspecified: Secondary | ICD-10-CM

## 2018-08-12 DIAGNOSIS — R7309 Other abnormal glucose: Secondary | ICD-10-CM

## 2018-08-12 DIAGNOSIS — N611 Abscess of the breast and nipple: Secondary | ICD-10-CM

## 2018-08-12 DIAGNOSIS — E782 Mixed hyperlipidemia: Secondary | ICD-10-CM

## 2018-08-12 LAB — PAP, TP IMAGING W/ HPV RNA, RFLX HPV TYPE 16,18/45: HPV DNA High Risk: NOT DETECTED

## 2018-08-12 MED ORDER — VITAMIN D (ERGOCALCIFEROL) 1.25 MG (50000 UNIT) PO CAPS: ORAL_CAPSULE | ORAL | 0 refills | Status: DC

## 2018-08-12 NOTE — Telephone Encounter (Signed)
Patient informed with the below note, both referral placed at [facility] and endo. They will call to schedule.

## 2018-08-12 NOTE — Telephone Encounter (Signed)
-----   Message from [name], MD sent at 08/11/2018 10:32 AM EDT ----- Regarding: Left Dx mammo/US and Right Screening Mammo Left breast abscess at 7 O'Clock.  Partially drained.

## 2018-08-12 NOTE — Telephone Encounter (Signed)
Patient scheduled at the breast center on 09/06/18 @ 8:40am patient aware,

## 2018-08-12 NOTE — Telephone Encounter (Signed)
-----   Message from [name], MD sent at 08/12/2018  8:53 AM EDT ----- Glucose very high. Add HBA1C.  Urgent referral to Endocrinology.  Low sugar diet.  Lipid profile very abnormal, high risk of cardiovascular disease.  Urgent referral to Internal medicine to treat.  Low Cholesterol diet.  Mild increase in AST, ALT, repeat CMP with Internal medicine.  Vit D very low, Vit D protocole 50 000 IU weekly x 12.  FSH in menopausal range.  CBC no significant abnormality, TSH normal.

## 2018-08-15 LAB — CBC
HCT: 45.1 % — ABNORMAL HIGH (ref 35.0–45.0)
Hemoglobin: 15.4 g/dL (ref 11.7–15.5)
MCH: 30.1 pg (ref 27.0–33.0)
MCHC: 34.1 g/dL (ref 32.0–36.0)
MCV: 88.3 fL (ref 80.0–100.0)
MPV: 11.8 fL (ref 7.5–12.5)
Platelets: 263 10*3/uL (ref 140–400)
RBC: 5.11 10*6/uL — ABNORMAL HIGH (ref 3.80–5.10)
RDW: 11.9 % (ref 11.0–15.0)
WBC: 5.4 10*3/uL (ref 3.8–10.8)

## 2018-08-15 LAB — COMPREHENSIVE METABOLIC PANEL
AG Ratio: 1.1 (calc) (ref 1.0–2.5)
ALT: 40 U/L — ABNORMAL HIGH (ref 6–29)
AST: 33 U/L — ABNORMAL HIGH (ref 10–30)
Albumin: 4.1 g/dL (ref 3.6–5.1)
Alkaline phosphatase (APISO): 77 U/L (ref 31–125)
BUN: 10 mg/dL (ref 7–25)
CO2: 28 mmol/L (ref 20–32)
Calcium: 9.4 mg/dL (ref 8.6–10.2)
Chloride: 99 mmol/L (ref 98–110)
Creat: 0.69 mg/dL (ref 0.50–1.10)
Globulin: 3.9 g/dL (calc) — ABNORMAL HIGH (ref 1.9–3.7)
Glucose, Bld: 327 mg/dL — ABNORMAL HIGH (ref 65–99)
Potassium: 4.2 mmol/L (ref 3.5–5.3)
Sodium: 137 mmol/L (ref 135–146)
Total Bilirubin: 0.9 mg/dL (ref 0.2–1.2)
Total Protein: 8 g/dL (ref 6.1–8.1)

## 2018-08-15 LAB — FOLLICLE STIMULATING HORMONE: FSH: 34.9 m[IU]/mL

## 2018-08-15 LAB — TEST AUTHORIZATION

## 2018-08-15 LAB — LIPID PANEL
Cholesterol: 268 mg/dL — ABNORMAL HIGH (ref <200)
HDL: 52 mg/dL (ref >=50)
LDL Cholesterol (Calc): 181 mg/dL (calc) — ABNORMAL HIGH
Non-HDL Cholesterol (Calc): 216 mg/dL (calc) — ABNORMAL HIGH (ref <130)
Total CHOL/HDL Ratio: 5.2 (calc) — ABNORMAL HIGH (ref <5.0)
Triglycerides: 195 mg/dL — ABNORMAL HIGH (ref <150)

## 2018-08-15 LAB — TSH: TSH: 1.15 mIU/L

## 2018-08-15 LAB — VITAMIN D 25 HYDROXY (VIT D DEFICIENCY, FRACTURES): Vit D, 25-Hydroxy: 14 ng/mL — ABNORMAL LOW (ref 30–100)

## 2018-08-15 LAB — HEMOGLOBIN A1C W/OUT EAG: Hgb A1c MFr Bld: 12.2 % of total Hgb — ABNORMAL HIGH (ref <5.7)

## 2018-08-17 ENCOUNTER — Ambulatory Visit: Payer: Self-pay | Admitting: Internal Medicine

## 2018-08-17 ENCOUNTER — Other Ambulatory Visit: Payer: Self-pay

## 2018-08-18 NOTE — Telephone Encounter (Signed)
Patient seen PCP on 08/17/18 Dr. Ardyth Harps  I called [facility] and they have referral, they are working on referral states they will call her to schedule.

## 2018-08-24 ENCOUNTER — Other Ambulatory Visit: Payer: Self-pay

## 2018-08-25 ENCOUNTER — Encounter: Payer: Self-pay | Admitting: Obstetrics & Gynecology

## 2018-08-25 ENCOUNTER — Ambulatory Visit (INDEPENDENT_AMBULATORY_CARE_PROVIDER_SITE_OTHER): Payer: PRIVATE HEALTH INSURANCE | Admitting: Obstetrics & Gynecology

## 2018-08-25 VITALS — BP 124/78

## 2018-08-25 DIAGNOSIS — N611 Abscess of the breast and nipple: Secondary | ICD-10-CM

## 2018-08-25 DIAGNOSIS — B372 Candidiasis of skin and nail: Secondary | ICD-10-CM | POA: Diagnosis not present

## 2018-08-25 DIAGNOSIS — R7989 Other specified abnormal findings of blood chemistry: Secondary | ICD-10-CM | POA: Diagnosis not present

## 2018-08-25 DIAGNOSIS — E119 Type 2 diabetes mellitus without complications: Secondary | ICD-10-CM | POA: Diagnosis not present

## 2018-08-25 MED ORDER — NYSTATIN 100000 UNIT/GM EX POWD: Freq: Two times a day (BID) | CUTANEOUS | 3 refills | Status: DC

## 2018-08-25 MED ORDER — FLUCONAZOLE 150 MG PO TABS: 150.0000 mg | ORAL_TABLET | Freq: Every day | ORAL | 2 refills | Status: AC

## 2018-08-25 NOTE — Progress Notes (Signed)
    Angelica Berg [birth year] [id number]        43 y.o.  G2P0011   RP: F/U breast abscess drained on 08/11/2018 and yeast of skin  HPI: Left breast abscess finished draining x I+D on 08/11/2018.  No breast pain.  No redness.  No fever.  Improved redness and itching at panniculus and vulva, but not completely resolved.  Annual gyn exam 08/11/2018:  HBA1C 12.2, Glucose 327.  Vit D 14.   OB History  Gravida Para Term Preterm AB Living  2 1     1 1   SAB TAB Ectopic Multiple Live Births  1            # Outcome Date GA Lbr Len/2nd Weight Sex Delivery Anes PTL Lv  2 SAB           1 Para             Past medical history,surgical history, problem list, medications, allergies, family history and social history were all reviewed and documented in the EPIC chart.   Directed ROS with pertinent positives and negatives documented in the history of present illness/assessment and plan.  Exam:  Vitals:   08/25/18 1240  BP: 124/78   General appearance:  Normal  Breasts:  Resolved left breast abscess.  Skin in process of closing, no sign of infection.  Abdomen: Improved erythema at panniculus, but still present  Gynecologic exam: Vulva:  Mild erythema   Annual gyn exam 08/11/2018:  HBA1C 12.2, Glucose 327.  Vit D 14.    Assessment/Plan:  43 y.o. G2P0011   1. Left breast abscess Left breast abscess resolved after I&D on August 11, 2018.  Skin closing without sign of infection.  Wound care reviewed with patient.  Left diagnostic mammogram with ultrasound scheduled, as well as right screening mammogram.  2. Yeast infection of the skin Nystatin powder and fluconazole tablet prescribed.  Usage reviewed and prescriptions sent to pharmacy.  3. New onset type 2 diabetes mellitus (HCC) Urgent referral for management of new onset type 2 diabetes mellitus.  Hemoglobin A1c 12.2 and glucose 327 on August 11, 2018.  Patient reinformed of the results and the importance of being evaluated by a family  physician/endocrinologist urgently was stressed.  Diabetes mellitus diet to be started immediately.  4. Low vitamin D level Recommend vitamin D protocol with 50,000 international unit weekly for 12 weeks.  Repeat vitamin D level after treatment.  If patient prefers not to receive the protocol as above, recommend a supplement of vitamin D at least 2000 international units daily.  Other orders - nystatin (MYCOSTATIN/NYSTOP) powder; Apply topically 2 (two) times daily. - fluconazole (DIFLUCAN) 150 MG tablet; Take 1 tablet (150 mg total) by mouth daily for 3 days.  Counseling on above issues and coordination of care more than 50% for 25 minutes.  [name] MD, 12:43 PM 08/25/2018

## 2018-08-26 ENCOUNTER — Encounter: Payer: Self-pay | Admitting: Obstetrics & Gynecology

## 2018-08-26 NOTE — Patient Instructions (Addendum)
1. Left breast abscess Left breast abscess resolved after I&D on August 11, 2018.  Skin closing without sign of infection.  Wound care reviewed with patient.  Left diagnostic mammogram with ultrasound scheduled, as well as right screening mammogram.  2. Yeast infection of the skin Nystatin powder and fluconazole tablet prescribed.  Usage reviewed and prescriptions sent to pharmacy.  3. New onset type 2 diabetes mellitus (HCC) Urgent referral for management of new onset type 2 diabetes mellitus.  Hemoglobin A1c 12.2 and glucose 327 on August 11, 2018.  Patient reinformed of the results and the importance of being evaluated by a family physician/endocrinologist urgently was stressed.  Diabetes mellitus diet to be started immediately.  4. Low vitamin D level Recommend vitamin D protocol with 50,000 international unit weekly for 12 weeks.  Repeat vitamin D level after treatment.  If patient prefers not to receive the protocol as above, recommend a supplement of vitamin D at least 2000 international units daily.  Other orders - nystatin (MYCOSTATIN/NYSTOP) powder; Apply topically 2 (two) times daily. - fluconazole (DIFLUCAN) 150 MG tablet; Take 1 tablet (150 mg total) by mouth daily for 3 days.  Angelica Berg, it was a pleasure seeing you today!

## 2018-08-30 ENCOUNTER — Other Ambulatory Visit: Payer: Self-pay

## 2018-08-30 ENCOUNTER — Ambulatory Visit (INDEPENDENT_AMBULATORY_CARE_PROVIDER_SITE_OTHER): Payer: Self-pay | Admitting: Internal Medicine

## 2018-08-30 DIAGNOSIS — E1169 Type 2 diabetes mellitus with other specified complication: Secondary | ICD-10-CM | POA: Insufficient documentation

## 2018-08-30 DIAGNOSIS — E119 Type 2 diabetes mellitus without complications: Secondary | ICD-10-CM | POA: Insufficient documentation

## 2018-08-30 DIAGNOSIS — E1121 Type 2 diabetes mellitus with diabetic nephropathy: Secondary | ICD-10-CM | POA: Insufficient documentation

## 2018-08-30 DIAGNOSIS — E785 Hyperlipidemia, unspecified: Secondary | ICD-10-CM

## 2018-08-30 DIAGNOSIS — R7401 Elevation of levels of liver transaminase levels: Secondary | ICD-10-CM | POA: Insufficient documentation

## 2018-08-30 DIAGNOSIS — R740 Nonspecific elevation of levels of transaminase and lactic acid dehydrogenase [LDH]: Secondary | ICD-10-CM

## 2018-08-30 DIAGNOSIS — E559 Vitamin D deficiency, unspecified: Secondary | ICD-10-CM

## 2018-08-30 MED ORDER — ATORVASTATIN CALCIUM 80 MG PO TABS: 80.0000 mg | ORAL_TABLET | Freq: Every day | ORAL | 1 refills | Status: DC

## 2018-08-30 MED ORDER — METFORMIN HCL 1000 MG PO TABS: 1000.0000 mg | ORAL_TABLET | Freq: Two times a day (BID) | ORAL | 1 refills | Status: DC

## 2018-08-30 MED ORDER — GLIPIZIDE 5 MG PO TABS: 5.0000 mg | ORAL_TABLET | Freq: Two times a day (BID) | ORAL | 1 refills | Status: DC

## 2018-08-30 NOTE — Telephone Encounter (Signed)
Patient scheduled on 09/01/18 with [name], Konrad Dolores, MD

## 2018-08-30 NOTE — Progress Notes (Signed)
Virtual Visit via Video Note  I connected with Angelica Berg on 08/30/18 at 11:00 AM EDT by a video enabled telemedicine application and verified that I am speaking with the correct person using two identifiers.  Location patient: home Location provider: work office Persons participating in the virtual visit: patient, provider  I discussed the limitations of evaluation and management by telemedicine and the availability of in person appointments. The patient expressed understanding and agreed to proceed.   HPI: She has been referred by her GYN due to new-onset diabetes. In April she was seen for her annual GYN visit and had multiple abnormalities on labs. She has not seen an MD in years. She has no acute complaints.  A1C was 12.2 with a CBG of 356, LDL 181, AST 33, ALT 40, Vit D 14, normal TSH   ROS: Constitutional: Denies fever, chills, diaphoresis, appetite change and fatigue.  HEENT: Denies photophobia, eye pain, redness, hearing loss, ear pain, congestion, sore throat, rhinorrhea, sneezing, mouth sores, trouble swallowing, neck pain, neck stiffness and tinnitus.   Respiratory: Denies SOB, DOE, cough, chest tightness,  and wheezing.   Cardiovascular: Denies chest pain, palpitations and leg swelling.  Gastrointestinal: Denies nausea, vomiting, abdominal pain, diarrhea, constipation, blood in stool and abdominal distention.  Genitourinary: Denies dysuria, urgency, frequency, hematuria, flank pain and difficulty urinating.  Endocrine: Denies: hot or cold intolerance, sweats, changes in hair or nails, polyuria, polydipsia. Musculoskeletal: Denies myalgias, back pain, joint swelling, arthralgias and gait problem.  Skin: Denies pallor, rash and wound.  Neurological: Denies dizziness, seizures, syncope, weakness, light-headedness, numbness and headaches.  Hematological: Denies adenopathy. Easy bruising, personal or family bleeding history  Psychiatric/Behavioral: Denies suicidal  ideation, mood changes, confusion, nervousness, sleep disturbance and agitation   No past medical history on file.  Past Surgical History:  Procedure Laterality Date  . CESAREAN SECTION    . KNEE ARTHROCENTESIS      Family History  Problem Relation Age of Onset  . Diabetes Mother   . Hypertension Mother   . Cancer Maternal Grandmother        throat  . Heart disease Maternal Grandmother     SOCIAL HX:   reports that she quit smoking about 2 months ago. She has never used smokeless tobacco. She reports previous alcohol use. She reports current drug use. Frequency: 7.00 times per week. Drug: Marijuana.   Current Outpatient Medications:  .  atorvastatin (LIPITOR) 80 MG tablet, Take 1 tablet (80 mg total) by mouth daily., Disp: 90 tablet, Rfl: 1 .  glipiZIDE (GLUCOTROL) 5 MG tablet, Take 1 tablet (5 mg total) by mouth 2 (two) times daily before a meal., Disp: 180 tablet, Rfl: 1 .  metFORMIN (GLUCOPHAGE) 1000 MG tablet, Take 1 tablet (1,000 mg total) by mouth 2 (two) times daily with a meal., Disp: 180 tablet, Rfl: 1 .  nystatin (MYCOSTATIN/NYSTOP) powder, Apply topically 2 (two) times daily., Disp: 15 g, Rfl: 3 .  Vitamin D, Ergocalciferol, (DRISDOL) 1.25 MG (50000 UT) CAPS capsule, Take one tablet by mouth once a week for 12 weeks then recheck vitamin d level at office. (Patient not taking: Reported on 08/25/2018), Disp: 12 capsule, Rfl: 0  EXAM:   VITALS per patient if applicable: none reported  GENERAL: alert, oriented, appears well and in no acute distress  HEENT: atraumatic, conjunttiva clear, no obvious abnormalities on inspection of external nose and ears  NECK: normal movements of the head and neck  LUNGS: on inspection no signs of respiratory  distress, breathing rate appears normal, no obvious gross increased work of breathing, gasping or wheezing  CV: no obvious cyanosis  MS: moves all visible extremities without noticeable abnormality  PSYCH/NEURO: pleasant and  cooperative, no obvious depression or anxiety, speech and thought processing grossly intact  ASSESSMENT AND PLAN:   New onset type 2 diabetes mellitus (HCC)  -Will refer for new diabetic education and nutrition classes. -She has been instructed to check CBGs at least twice a day. -Will start on metformin 1000 mg BID and glipizde 5 mg BID, may need escalation in therapy. May need injectables. -In office visit in 2 months for follow up. She will bring CBG log in to that visit.  Morbid obesity (HCC) -discussed healthy lifestyle and need for weight reduction.  Vitamin D deficiency -Rx for vit D 50000 IU weekly for 12 weeks sent by GYN, she has not yet started.  Hyperlipidemia associated with type 2 diabetes mellitus (HCC)  -Needs high intensity statin for goal LDL reduction >50%. -LDL 181, goal <70. -Start lipitor 80 mg daily. -Recheck cholesterol in 3 months.  Transaminitis -Likely fatty liver. -No ETOH use. -Recheck in 3 months after starting statin and DM meds to follow.     I discussed the assessment and treatment plan with the patient. The patient was provided an opportunity to ask questions and all were answered. The patient agreed with the plan and demonstrated an understanding of the instructions.   The patient was advised to call back or seek an in-person evaluation if the symptoms worsen or if the condition fails to improve as anticipated.    Chaya JanEstela Hernandez Acosta, MD  [facility]

## 2018-08-31 ENCOUNTER — Encounter: Payer: Self-pay | Admitting: Internal Medicine

## 2018-08-31 LAB — HM DIABETES EYE EXAM

## 2018-09-01 ENCOUNTER — Other Ambulatory Visit: Payer: Self-pay

## 2018-09-01 ENCOUNTER — Encounter: Payer: Self-pay | Admitting: Internal Medicine

## 2018-09-01 ENCOUNTER — Ambulatory Visit (INDEPENDENT_AMBULATORY_CARE_PROVIDER_SITE_OTHER): Payer: Self-pay | Admitting: Internal Medicine

## 2018-09-01 DIAGNOSIS — E119 Type 2 diabetes mellitus without complications: Secondary | ICD-10-CM

## 2018-09-01 DIAGNOSIS — E785 Hyperlipidemia, unspecified: Secondary | ICD-10-CM

## 2018-09-01 MED ORDER — GLIPIZIDE 5 MG PO TABS: 5.0000 mg | ORAL_TABLET | Freq: Two times a day (BID) | ORAL | 1 refills | Status: DC

## 2018-09-01 MED ORDER — GLUCOSE BLOOD VI STRP: ORAL_STRIP | 12 refills | Status: DC

## 2018-09-01 MED ORDER — METFORMIN HCL 1000 MG PO TABS: 1000.0000 mg | ORAL_TABLET | Freq: Two times a day (BID) | ORAL | 1 refills | Status: DC

## 2018-09-01 MED ORDER — ONETOUCH VERIO W/DEVICE KIT: 1.0000 | PACK | 0 refills | Status: AC

## 2018-09-01 NOTE — Progress Notes (Signed)
Virtual Visit via Video Note  I connected with Angelica Berg on 09/01/18 at  9:10 AM EDT by a video enabled telemedicine application and verified that I am speaking with the correct person using two identifiers.   I discussed the limitations of evaluation and management by telemedicine and the availability of in person appointments. The patient expressed understanding and agreed to proceed.  -Location of the patient : Home  -Location of the provider : office  -The names of all persons participating in the telemedicine service : Pt and myself     Name: Angelica Berg  MRN/ DOB: [id number], 1976-03-06   Age/ Sex: 43 y.o., female    PCP: Angelica Berg   Reason for Endocrinology Evaluation: Type 2 Diabetes Mellitus     Date of Initial Endocrinology Visit: 09/01/2018     PATIENT IDENTIFIER: Angelica Berg is a 43 y.o. female with unremarkable  past medical history. The patient presented for initial endocrinology clinic visit on 09/01/2018 for consultative assistance with her diabetes management.    HPI: Angelica Berg was    Diagnosed with T2DM in 07/2018 during routine work up Prior Medications tried/Intolerance: Started on Metformin and Glipizide  Currently checking blood sugars 0 x / day Hypoglycemia episodes : no              Hemoglobin A1c 12.2 % , no prior records Patient required assistance for hypoglycemia: no Patient has required hospitalization within the last 1 year from hyper or hypoglycemia: no  In terms of diet, the patient drinks sugar-sweetened beverages   HOME DIABETES REGIMEN: Metformin - not taking  Glipizide - not taking    Statin: yes ACE-I/ARB: no Prior Diabetic Education: Pending referral through PCP   GLUCOSE LOG: n/a   DIABETIC COMPLICATIONS: Microvascular complications:    Denies: retinopathy, CKD, neuropathy   Last eye exam: Completed 08/2018   Macrovascular complications:    Denies: CAD, PVD, CVA   PAST  HISTORY:  Past Medical History: History reviewed. No pertinent past medical history. Past Surgical History:  Past Surgical History:  Procedure Laterality Date  . CESAREAN SECTION    . KNEE ARTHROCENTESIS        Social History:  reports that she quit smoking about 2 months ago. She has never used smokeless tobacco. She reports previous alcohol use. She reports current drug use. Frequency: 7.00 times per week. Drug: Marijuana. Family History:  Family History  Problem Relation Age of Onset  . Diabetes Mother   . Hypertension Mother   . Cancer Maternal Grandmother        throat  . Heart disease Maternal Grandmother      HOME MEDICATIONS: Allergies as of 09/01/2018   No Known Allergies     Medication List       Accurate as of Sep 01, 2018  9:39 AM. If you have any questions, ask your nurse or doctor.        atorvastatin 80 MG tablet Commonly known as:  LIPITOR Take 1 tablet (80 mg total) by mouth daily.   glipiZIDE 5 MG tablet Commonly known as:  GLUCOTROL Take 1 tablet (5 mg total) by mouth 2 (two) times daily before a meal.   glucose blood test strip Commonly known as:  OneTouch Verio 2x daily Started by:  Angelica Sciara, Berg   metFORMIN 1000 MG tablet Commonly known as:  GLUCOPHAGE Take 1 tablet (1,000 mg total) by mouth 2 (two) times daily with a meal.  nystatin powder Commonly known as:  MYCOSTATIN/NYSTOP Apply topically 2 (two) times daily.   OneTouch Verio w/Device Kit 1 Device by Does not apply route as directed. Started by:  Angelica Sciara, Berg   Vitamin D (Ergocalciferol) 1.25 MG (50000 UT) Caps capsule Commonly known as:  DRISDOL Take one tablet by mouth once a week for 12 weeks then recheck vitamin d level at office.        ALLERGIES: No Known Allergies   REVIEW OF SYSTEMS: A comprehensive ROS was conducted with the patient and is negative except as per HPI and below:  Review of Systems  Constitutional: Negative for fever.   HENT: Negative for congestion and sore throat.   Respiratory: Negative for cough and shortness of breath.   Cardiovascular: Positive for palpitations. Negative for chest pain.  Gastrointestinal: Positive for nausea. Negative for diarrhea.  Genitourinary: Positive for frequency.  Skin: Negative.   Neurological: Negative for tingling and tremors.  Endo/Heme/Allergies: Positive for polydipsia.  Psychiatric/Behavioral: Negative for depression. The patient is not nervous/anxious.         DATA REVIEWED:  Lab Results  Component Value Date   HGBA1C 12.2 (H) 08/11/2018   Lab Results  Component Value Date   LDLCALC 181 (H) 08/11/2018   CREATININE 0.69 08/11/2018    Lab Results  Component Value Date   CHOL 268 (H) 08/11/2018   HDL 52 08/11/2018   LDLCALC 181 (H) 08/11/2018   TRIG 195 (H) 08/11/2018   CHOLHDL 5.2 (H) 08/11/2018        ASSESSMENT / PLAN / RECOMMENDATIONS:   1) Type 2 Diabetes Mellitus, Newly Diagnosed, Without complications - Most recent A1c of 12.2 %. Goal A1c < 7.0 %.    Plan: GENERAL: I have discussed with the patient the pathophysiology of diabetes. We went over the natural progression of the disease. We talked about both insulin resistance and insulin deficiency. We stressed the importance of lifestyle changes including diet and exercise. I explained the complications associated with diabetes including retinopathy, nephropathy, neuropathy as well as increased risk of cardiovascular disease. We went over the benefit seen with glycemic control.    I explained to the patient that diabetic patients are at higher than normal risk for amputations. The patient was informed that diabetes is the number one cause of non-traumatic amputations in [place].  Discussed the importance of avoiding sugar-sweetened beverages and snacks, discussed low carb snack options.   Encouraged glucose checks at home and the importance of these to me, to make proper decisions.   She has  not started any of her meds yet, somehow the metformin was ~ $80 , so she is transferring her prescription to [facility]  Her sister is a Marine scientist in [place] and has been helping her, she is asking about ER metformin, we did disucss the extended release is more tolerable, but she will try the regular release, discussed starting with 1 tab daily with a meal and gradually titrating to BID after 1-2 weeks   Discussed GI side effects with metformin as well as risk of hypoglycemia and weight gain with glipizide.   Encouraged exercise in the form of brisk walking      MEDICATIONS:  Metformin 1000 mg BID with meals   Glipizide 5 mg BID with meals  EDUCATION / INSTRUCTIONS:  BG monitoring instructions: Patient is instructed to check her blood sugars 2 times a day, fasting and bedtime.  Call [facility] if: BG persistently < 70 or > 300. Marland Kitchen  I reviewed the Rule of 15 for the treatment of hypoglycemia in detail with the patient. Literature supplied.   2) Diabetic complications:   Eye: Does not have known diabetic retinopathy.   Neuro/ Feet: Does not have known diabetic peripheral neuropathy.  Renal: Patient does not have known baseline CKD. She is not on an ACEI/ARB at present. Check urine albumin/creatinine ratio yearly starting at time of diagnosis. If albuminuria is positive, treatment is geared toward better glucose, blood pressure control and use of ACE inhibitors or ARBs. Monitor electrolytes and creatinine once to twice yearly.   3) Lipids: Patient has not started Lipitor yet. Her LDL was 181 mg/dL. We discussed cardiovascular benefits with statins     I discussed the assessment and treatment plan with the patient. The patient was provided an opportunity to ask questions and all were answered. The patient agreed with the plan and demonstrated an understanding of the instructions.   The patient was advised to call back or seek an in-person evaluation if the symptoms  worsen or if the condition fails to improve as anticipated.  F/u in 6 weeks    Signed electronically by: Mack Guise, Berg  [facility]  [facility] [address] [place], [place] [address] Phone: [phone] FAX: [phone]   CC: Angelica Berg [place] [address] Phone: [phone] Fax: [phone]   Return to Endocrinology clinic as below: Future Appointments  Date Time Provider [place]  09/06/2018  9:00 AM [facility] DIAG [place] 1 [facility]  09/06/2018  9:10 AM [facility] [place] 1 [facility]

## 2018-09-06 ENCOUNTER — Other Ambulatory Visit: Payer: Self-pay

## 2018-09-06 ENCOUNTER — Ambulatory Visit
Admission: RE | Admit: 2018-09-06 | Discharge: 2018-09-06 | Disposition: A | Discharge status: Home / Self Care | Payer: Self-pay | Source: Ambulatory Visit | Attending: Obstetrics & Gynecology | Admitting: Obstetrics & Gynecology

## 2018-09-06 DIAGNOSIS — N611 Abscess of the breast and nipple: Secondary | ICD-10-CM

## 2018-09-22 ENCOUNTER — Encounter: Payer: Self-pay | Admitting: Internal Medicine

## 2018-10-13 ENCOUNTER — Encounter: Payer: Self-pay | Admitting: Internal Medicine

## 2018-10-13 ENCOUNTER — Ambulatory Visit (INDEPENDENT_AMBULATORY_CARE_PROVIDER_SITE_OTHER): Payer: Self-pay | Admitting: Internal Medicine

## 2018-10-13 ENCOUNTER — Other Ambulatory Visit: Payer: Self-pay

## 2018-10-13 DIAGNOSIS — E119 Type 2 diabetes mellitus without complications: Secondary | ICD-10-CM

## 2018-10-13 NOTE — Progress Notes (Signed)
Virtual Visit via Video Note  I connected with [name] on 10/13/18 at 8:50 AM  by a video enabled telemedicine application and verified that I am speaking with the correct person using two identifiers.   I discussed the limitations of evaluation and management by telemedicine and the availability of in person appointments. The patient expressed understanding and agreed to proceed.   -Location of the patient :Home  -Location of the provider : Office -The names of all persons participating in the telemedicine service : Pt and myself      Name: Angelica Berg  Age/ Sex: 43 y.o., female   MRN/ DOB: [id number], Sep 17, 1975     PCP: [name], [name], MD   Reason for Endocrinology Evaluation: Type 2 Diabetes Mellitus  Initial Endocrine Consultative Visit: 09/01/2018    PATIENT IDENTIFIER: Angelica Berg is a 43 y.o. female with a past medical history of T2DM . The patient has followed with Endocrinology clinic since 09/01/2018 for consultative assistance with management of her diabetes.  DIABETIC HISTORY:  Angelica Berg was diagnosed with T2DM in 07/2018 during routine work up, She was started on Metformin and glipizide. Her hemoglobin A1c at diagnosis was 12.2%   SUBJECTIVE:   During the last visit (09/01/2018): A1c 12.2%. She was not taking metformin and glipizide and was advised to start.  Today (10/13/2018): Angelica Berg is here for a 6- week follow up on diabetes management.  She checks her blood sugars 2 times daily, preprandial to breakfast and bedtime. The patient has not had hypoglycemic episodes since the last clinic visit. Otherwise, the patient has not required any recent emergency interventions for hypoglycemia and has not had recent hospitalizations secondary to hyper or hypoglycemic episodes.    ROS: As per HPI and as detailed below: Review of Systems  Constitutional: Negative for chills and fever.  HENT: Negative for congestion and sore throat.    Respiratory: Negative for cough and shortness of breath.   Cardiovascular: Negative for chest pain and palpitations.  Gastrointestinal: Negative for diarrhea and nausea.      HOME DIABETES REGIMEN:  Metformin 1000 mg , half in the morning and 1 table with supper  Glipizide 5 mg BID     METER DOWNLOAD SUMMARY:                   Fasting      Lunch       Bedtime   10/12/18       166                              185 6/23                                91             148 6/22                                                  122 6/20              164   HISTORY:   Past Medical History: No past medical history on file. Past Surgical History:  Past Surgical History:  Procedure Laterality Date  .  CESAREAN SECTION    . KNEE ARTHROCENTESIS       Social History:  reports that she quit smoking about 4 months ago. She has never used smokeless tobacco. She reports previous alcohol use. She reports current drug use. Frequency: 7.00 times per week. Drug: Marijuana. Family History:  Family History  Problem Relation Age of Onset  . Diabetes Mother   . Hypertension Mother   . Cancer Maternal Grandmother        throat  . Heart disease Maternal Grandmother       HOME MEDICATIONS: Allergies as of 10/13/2018   No Known Allergies     Medication List       Accurate as of October 13, 2018  7:57 AM. If you have any questions, ask your nurse or doctor.        atorvastatin 80 MG tablet Commonly known as: LIPITOR Take 1 tablet (80 mg total) by mouth daily.   glipiZIDE 5 MG tablet Commonly known as: GLUCOTROL Take 1 tablet (5 mg total) by mouth 2 (two) times daily before a meal.   glucose blood test strip Commonly known as: OneTouch Verio 2x daily   metFORMIN 1000 MG tablet Commonly known as: GLUCOPHAGE Take 1 tablet (1,000 mg total) by mouth 2 (two) times daily with a meal.   nystatin powder Commonly known as: MYCOSTATIN/NYSTOP Apply topically 2 (two) times daily.   OneTouch  Verio w/Device Kit 1 Device by Does not apply route as directed.   Vitamin D (Ergocalciferol) 1.25 MG (50000 UT) Caps capsule Commonly known as: DRISDOL Take one tablet by mouth once a week for 12 weeks then recheck vitamin d level at office.                DATA REVIEWED:  Lab Results  Component Value Date   HGBA1C 12.2 (H) 08/11/2018   Lab Results  Component Value Date   LDLCALC 181 (H) 08/11/2018   CREATININE 0.69 08/11/2018     Lab Results  Component Value Date   CHOL 268 (H) 08/11/2018   HDL 52 08/11/2018   LDLCALC 181 (H) 08/11/2018   TRIG 195 (H) 08/11/2018   CHOLHDL 5.2 (H) 08/11/2018         ASSESSMENT / PLAN / RECOMMENDATIONS:   1) Type 2 Diabetes Mellitus, Newly diagnosed , Without complications - Most recent A1c of 12.2 %. Goal A1c < 7.0 %.    Plan:   - Her Bg's have been in the acceptable range, she is not having any side effects - She has made a lot of changes to her lifestyle which I have praised her for.  - She is scheduled to see our CDE soon  - I have advised her to increase metformin dose as below     MEDICATIONS:  Increase Metformin to 1000 mg BID   Continue Glipizide 5 mg BID with meals   EDUCATION / INSTRUCTIONS:  BG monitoring instructions: Patient is instructed to check her blood sugars 2 times a day, fasting and bedtime.  Call [facility] if: BG persistently < 70 or > 300. . I reviewed the Rule of 15 for the treatment of hypoglycemia in detail with the patient. Literature supplied.    F/U in 3 month    Signed electronically by: Mack Guise, MD  [facility] End[facility]  [facility] [address] [place], [place] [address] Phone: [phone] FAX: [phone]   CC: [name], [name], MD [place]  [place] [address] Phone: [phone]  Fax: [phone]  Return to Endocrinology clinic as below: Future Appointments  Date Time  Provider [place]  10/13/2018  8:50 AM Alton Tremblay, [name], MD [facility] None  10/20/2018 10:00 AM Christella Hartigan, RD [place]  11/08/2018  9:00 AM [name], [name], MD [facility]

## 2018-10-20 ENCOUNTER — Encounter: Payer: PRIVATE HEALTH INSURANCE | Attending: Internal Medicine | Admitting: Registered"

## 2018-10-20 ENCOUNTER — Encounter: Payer: Self-pay | Admitting: Registered"

## 2018-10-20 ENCOUNTER — Other Ambulatory Visit: Payer: Self-pay

## 2018-10-20 ENCOUNTER — Telehealth: Payer: Self-pay | Admitting: Internal Medicine

## 2018-10-20 DIAGNOSIS — E119 Type 2 diabetes mellitus without complications: Secondary | ICD-10-CM | POA: Insufficient documentation

## 2018-10-20 NOTE — Progress Notes (Signed)
Diabetes Self-Management Education  Visit Type: First/Initial  Appt. Start Time: 1020 Appt. End Time: 1130  10/20/2018  Angelica Berg, identified by name and date of birth, is a 43 y.o. female with a diagnosis of Diabetes: Type 2.   ASSESSMENT  Height 5\' 10"  (1.778 m), weight 259 lb 14.4 oz (117.9 kg). Body mass index is 37.29 kg/m.   Pt states her weight has been stable, but recently people around her tell her that she's losing weight and she states her shirts are starting to feel looser. Pt states she has been putting in a lot of effort to eat healthy and eats a lot of vegetables, cut back on breads and potatoes. Pt states she has cut back on soda but still likes to drink ginger ale with dinner (not every night) to help keep her stomach settled.  Eating patterns: Has irregular sleep patterns so meal timing varies from day to day. Pt reports she does not like to eat breakfast but does so to take medication. Pt states if she eats a late breakfast may not eat lunch. Pt states she tries to stop eating after 10 pm, but never really stops. Tries to eat something light such as fruit after 10.  Pt has difficult time sleeping. Last night went to bed at 3 am and woke up several times because she knew she had this appoint this morning. Pt states she has lifetime history of poor sleep. Pt states she has frequent headaches.  SMBG: Pt states she checks am/pm. FBG ranges from 146-244, night ranges 136-170 mg/dL.   Medication: Pt was resistant to taking medication, but her sister who is RN also has T2DM and was able to get her BG under control with weight loss weight, medication and lifestyle changes. Pt states her sister coached her into how to titrate metformin dose to address GI symptoms and patient started breaking pill in half, 500 mg am and 1,000 mg at night was tolerable. When patient increased to the prescribed amount of 1000 2x day diarrhea returned as well as nausea.  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 She recently tried the product Thermafit for weight loss, but stopped when started having GI symptoms.  Physical activity: Pt states she has tried heat challenge that is a 6 week program, found on Facebook that includes a meal plan and exercise routine, 3rd time trying it. Pt states she stopped exercising 3 weeks ago d/t  back pain (treating with 800 mg ibuprofen). Pt states she sometimes tries to push through pain, but has lost her motivation to do so.  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 Sister keeping her twins for a couple of weeks, she does this each summer.  Last lab work LDL 181; Vit D 14. Pt states she had not started the Rx vit D and RD explained why it is important.  Diabetes Self-Management Education - 10/20/18 1040      Visit Information   Visit Type  First/Initial      Initial Visit   Diabetes Type  Type 2    Are you currently following a meal plan?  Yes    What type of meal plan do you follow?  trying to eat right wants to get off medication.    Are you taking your medications as prescribed?  No   glipizide, yes, but reduced metformin to 1500 d/t diarrhea.   Date Diagnosed  09/2018      Health Coping   How would you rate your overall health?  Fair      Psychosocial Assessment   Patient Belief/Attitude about Diabetes  Motivated to manage diabetes    How often do you need to have someone help you when you read instructions, pamphlets, or other written materials from your doctor or pharmacy?  1 - Never    What is the last grade level you completed in school?  some college      Complications   Last HgB A1C per patient/outside source  12.2 %    How often do you check your blood sugar?  1-2  times/day    Fasting Blood glucose range (mg/dL)  454-098;119-147;>829130-179;180-200;>200    Have you had a dilated eye exam in the past 12 months?  Yes    Have you had a dental exam in the past 12 months?  No    Are you checking your feet?  No      Dietary Intake   Breakfast  little cup of oatmeal (doesn't like to eat breakfast) OR [place]urkey bacon, eggs, sometimes on toast    Snack (morning)  trail mix with seasame sticks OR special K crispy    Lunch  none OR grilled chicken, roasted broccoli;  1-2 week fish doesn't like smell    Snack (afternoon)  none OR apple juice    Dinner  same as lunch    Snack (evening)  fruit    Beverage(s)  water, 12 oz gingerale with dinner, apple juice, coffee      Exercise   Exercise Type  ADL's    How many days per week to you exercise?  0    How many minutes per day do you exercise?  0    Total minutes per week of exercise  0      Patient Education   Previous Diabetes Education  No    Nutrition management   Role of diet in the treatment of diabetes and the relationship between the three main macronutrients and blood glucose level    Monitoring  Identified appropriate SMBG and/or A1C goals.;Purpose and frequency of SMBG.      Individualized Goals (developed by patient)   Nutrition  General guidelines for healthy choices and portions discussed    Medications  take my medication as prescribed    Monitoring   test my blood glucose as discussed      Outcomes   Expected Outcomes  Demonstrated interest in learning. Expect positive outcomes    Future DMSE  4-6 wks    Program Status  Not Completed       Individualized Plan for Diabetes Self-Management Training:   Learning Objective:  Patient will have a greater understanding of diabetes self-management. Patient education plan is to attend individual and/or group sessions per assessed needs and concerns.  Patient Instructions  Consider taking your vitamin D prescription and after you are done with that start an over the  counter Vitamin d 2,000 u. Consider talking to endocrinologist about adjusting or changing metformin due to GI issues. Consider talking to doctor about back pain. Consider looking into yoga stretches. Aim to eat balanced meals and snacks Consider continuing to cut back on ginger ale  Remember to eat  protein with fruit.   Expected Outcomes:  Demonstrated interest in learning. Expect positive outcomes  Education material provided: ADA - How to Thrive: A Guide for Your Journey with Diabetes, A1C conversion sheet and Carbohydrate counting sheet, Sleep Hygiene, Stress Mgmt tips  If problems or questions, patient to contact team via:  Phone  Future DSME appointment: 4-6 wks

## 2018-10-20 NOTE — Telephone Encounter (Signed)
Called pt and lft vm to return call to discuss medication, metformin

## 2018-10-20 NOTE — Telephone Encounter (Signed)
Patient has question about her Metformin. Stopped by after her nutrition appointment and would like a call back at [phone]

## 2018-10-20 NOTE — Patient Instructions (Addendum)
Consider taking your vitamin D prescription and after you are done with that start an over the counter Vitamin d 2,000 u. Consider talking to endocrinologist about adjusting or changing metformin due to GI issues. Consider talking to doctor about back pain. Consider looking into yoga stretches. Aim to eat balanced meals and snacks Consider continuing to cut back on ginger ale  Remember to eat protein with fruit.

## 2018-11-08 ENCOUNTER — Other Ambulatory Visit: Payer: Self-pay

## 2018-11-08 ENCOUNTER — Ambulatory Visit (INDEPENDENT_AMBULATORY_CARE_PROVIDER_SITE_OTHER): Payer: PRIVATE HEALTH INSURANCE | Admitting: Internal Medicine

## 2018-11-08 DIAGNOSIS — E119 Type 2 diabetes mellitus without complications: Secondary | ICD-10-CM

## 2018-11-08 DIAGNOSIS — R740 Nonspecific elevation of levels of transaminase and lactic acid dehydrogenase [LDH]: Secondary | ICD-10-CM

## 2018-11-08 DIAGNOSIS — R7401 Elevation of levels of liver transaminase levels: Secondary | ICD-10-CM

## 2018-11-08 DIAGNOSIS — E559 Vitamin D deficiency, unspecified: Secondary | ICD-10-CM

## 2018-11-08 DIAGNOSIS — E785 Hyperlipidemia, unspecified: Secondary | ICD-10-CM

## 2018-11-08 DIAGNOSIS — E1169 Type 2 diabetes mellitus with other specified complication: Secondary | ICD-10-CM

## 2018-11-08 NOTE — Progress Notes (Signed)
Virtual Visit via Video Note  I connected with Angelica Berg on 11/08/18 at  9:00 AM EDT by a video enabled telemedicine application and verified that I am speaking with the correct person using two identifiers.  Location patient: home Location provider: work office Persons participating in the virtual visit: patient, provider  I discussed the limitations of evaluation and management by telemedicine and the availability of in person appointments. The patient expressed understanding and agreed to proceed.   HPI: This is a scheduled visit for follow up chronic conditions.  She was recently diagnosed with DM 2 that has not been well controlled. She is supposed to be on metformin 1000 BID but has been doing only half that dose due to stomach upset. CBGs between 129-173 last 3 weeks. A1c was 12.2 in April upon diagnosis.  She also has HLD, recently started on Lipitor 80 mg with goal <70.  Transaminitis thought due to fatty liver.  Vit D def started on high dose Vit D by GYN.  She has no acute complaints today.   ROS: Constitutional: Denies fever, chills, diaphoresis, appetite change and fatigue.  HEENT: Denies photophobia, eye pain, redness, hearing loss, ear pain, congestion, sore throat, rhinorrhea, sneezing, mouth sores, trouble swallowing, neck pain, neck stiffness and tinnitus.   Respiratory: Denies SOB, DOE, cough, chest tightness,  and wheezing.   Cardiovascular: Denies chest pain, palpitations and leg swelling.  Gastrointestinal: Denies nausea, vomiting, abdominal pain, diarrhea, constipation, blood in stool and abdominal distention.  Genitourinary: Denies dysuria, urgency, frequency, hematuria, flank pain and difficulty urinating.  Endocrine: Denies: hot or cold intolerance, sweats, changes in hair or nails, polyuria, polydipsia. Musculoskeletal: Denies myalgias, back pain, joint swelling, arthralgias and gait problem.  Skin: Denies pallor, rash and wound.   Neurological: Denies dizziness, seizures, syncope, weakness, light-headedness, numbness and headaches.  Hematological: Denies adenopathy. Easy bruising, personal or family bleeding history  Psychiatric/Behavioral: Denies suicidal ideation, mood changes, confusion, nervousness, sleep disturbance and agitation   Past Medical History:  Diagnosis Date  . Diabetes mellitus ([place])   . Dyslipidemia     Past Surgical History:  Procedure Laterality Date  . CESAREAN SECTION    . KNEE ARTHROCENTESIS      Family History  Problem Relation Age of Onset  . Diabetes Mother   . Hypertension Mother   . Cancer Maternal Grandmother        throat  . Heart disease Maternal Grandmother     SOCIAL HX:   reports that she quit smoking about 4 months ago. She has never used smokeless tobacco. She reports previous alcohol use. She reports current drug use. Frequency: 7.00 times per week. Drug: Marijuana.   Current Outpatient Medications:  .  atorvastatin (LIPITOR) 80 MG tablet, Take 1 tablet (80 mg total) by mouth daily., Disp: 90 tablet, Rfl: 1 .  Blood Glucose Monitoring Suppl (ONETOUCH VERIO) w/Device KIT, 1 Device by Does not apply route as directed., Disp: 1 kit, Rfl: 0 .  ergocalciferol (VITAMIN D2) 1.25 MG (50000 UT) capsule, Take 50,000 Units by mouth once a week., Disp: , Rfl:  .  glipiZIDE (GLUCOTROL) 5 MG tablet, Take 1 tablet (5 mg total) by mouth 2 (two) times daily before a meal., Disp: 180 tablet, Rfl: 1 .  glucose blood (ONETOUCH VERIO) test strip, 2x daily, Disp: 100 each, Rfl: 12 .  metFORMIN (GLUCOPHAGE) 1000 MG tablet, Take 1 tablet (1,000 mg total) by mouth 2 (two) times daily with a meal. (Patient taking differently: Take  1,000 mg by mouth 2 (two) times daily with a meal. 1/2 in a.m. and 1 tab at night), Disp: 180 tablet, Rfl: 1  EXAM:   VITALS per patient if applicable: none reported  GENERAL: alert, oriented, appears well and in no acute distress  HEENT: atraumatic,  conjunttiva clear, no obvious abnormalities on inspection of external nose and ears, wears corrective lenses  NECK: normal movements of the head and neck  LUNGS: on inspection no signs of respiratory distress, breathing rate appears normal, no obvious gross increased work of breathing, gasping or wheezing  CV: no obvious cyanosis  MS: moves all visible extremities without noticeable abnormality  PSYCH/NEURO: pleasant and cooperative, no obvious depression or anxiety, speech and thought processing grossly intact  ASSESSMENT AND PLAN:   New onset type 2 diabetes mellitus ([place]) -Suspect A1c will be improved given CBGs. -Advised to try an increase metformin to 1000 BID. -Continue glipizide 5 BID. -Next f/u in 3 months in office to check A1c.  Morbid obesity ([place]) -Discussed healthy lifestyle, including increased physical activity and better food choices to promote weight loss.  Vitamin D deficiency -Check levels again once completes 12 weeks of treatment.  Transaminitis -Suspect due to fatty liver. -Recheck a follow up visit.  Hyperlipidemia associated with type 2 diabetes mellitus ([place]) -Goal LDL <70; was 181 at diagnosis in April. -Started on high-intensity statin (Lipitor 80). -Recheck lipids next visit.     I discussed the assessment and treatment plan with the patient. The patient was provided an opportunity to ask questions and all were answered. The patient agreed with the plan and demonstrated an understanding of the instructions.   The patient was advised to call back or seek an in-person evaluation if the symptoms worsen or if the condition fails to improve as anticipated.    Lelon Frohlich, MD  [facility] at [facility]

## 2018-11-24 ENCOUNTER — Ambulatory Visit: Payer: PRIVATE HEALTH INSURANCE | Admitting: Registered"

## 2019-02-15 ENCOUNTER — Ambulatory Visit (INDEPENDENT_AMBULATORY_CARE_PROVIDER_SITE_OTHER): Payer: PRIVATE HEALTH INSURANCE | Admitting: Internal Medicine

## 2019-02-15 ENCOUNTER — Encounter: Payer: Self-pay | Admitting: Internal Medicine

## 2019-02-15 ENCOUNTER — Other Ambulatory Visit: Payer: Self-pay

## 2019-02-15 VITALS — BP 130/80 | HR 81 | Temp 98.0°F | Ht 70.0 in | Wt 257.8 lb

## 2019-02-15 DIAGNOSIS — E119 Type 2 diabetes mellitus without complications: Secondary | ICD-10-CM | POA: Diagnosis not present

## 2019-02-15 DIAGNOSIS — R7401 Elevation of levels of liver transaminase levels: Secondary | ICD-10-CM

## 2019-02-15 DIAGNOSIS — E1169 Type 2 diabetes mellitus with other specified complication: Secondary | ICD-10-CM

## 2019-02-15 DIAGNOSIS — E559 Vitamin D deficiency, unspecified: Secondary | ICD-10-CM | POA: Diagnosis not present

## 2019-02-15 DIAGNOSIS — E785 Hyperlipidemia, unspecified: Secondary | ICD-10-CM

## 2019-02-15 LAB — POCT GLYCOSYLATED HEMOGLOBIN (HGB A1C): Hemoglobin A1C: 6 % — AB (ref 4.0–5.6)

## 2019-02-15 NOTE — Patient Instructions (Signed)
-  Nice seeing you today!!  -Great job in getting your A1c under control.  -Consider receiving your flu vaccine.  -Schedule follow up in 3 months for your physical. Please come in fasting that day.

## 2019-02-15 NOTE — Progress Notes (Signed)
Established Patient Office Visit     CC/Reason for Visit: DM follow up  HPI: Angelica Berg is a 43 y.o. female who is coming in today for the above mentioned reasons. Past Medical History is significant for: Type 2 diabetes diagnosed in April that has not been well controlled.  Her A1c was 12.2 upon diagnosis, also history of hyperlipidemia, transaminitis and vitamin D deficiency.  She has no complaints today, she has worked really hard on diet and exercise, has seen a dietitian in regards to diabetic diet.  She is here for diabetes follow-up.   Past Medical/Surgical History: Past Medical History:  Diagnosis Date  . Diabetes mellitus ([place])   . Dyslipidemia     Past Surgical History:  Procedure Laterality Date  . CESAREAN SECTION    . KNEE ARTHROCENTESIS      Social History:  reports that she quit smoking about 8 months ago. She has never used smokeless tobacco. She reports previous alcohol use. She reports current drug use. Frequency: 7.00 times per week. Drug: Marijuana.  Allergies: No Known Allergies  Family History:  Family History  Problem Relation Age of Onset  . Diabetes Mother   . Hypertension Mother   . Cancer Maternal Grandmother        throat  . Heart disease Maternal Grandmother      Current Outpatient Medications:  .  atorvastatin (LIPITOR) 80 MG tablet, Take 1 tablet (80 mg total) by mouth daily., Disp: 90 tablet, Rfl: 1 .  Blood Glucose Monitoring Suppl (ONETOUCH VERIO) w/Device KIT, 1 Device by Does not apply route as directed., Disp: 1 kit, Rfl: 0 .  glipiZIDE (GLUCOTROL) 5 MG tablet, Take 1 tablet (5 mg total) by mouth 2 (two) times daily before a meal., Disp: 180 tablet, Rfl: 1 .  glucose blood (ONETOUCH VERIO) test strip, 2x daily, Disp: 100 each, Rfl: 12 .  metFORMIN (GLUCOPHAGE) 1000 MG tablet, Take 1 tablet (1,000 mg total) by mouth 2 (two) times daily with a meal. (Patient taking differently: Take 1,000 mg by mouth 2 (two) times daily  with a meal. 1/2 in a.m. and 1 tab at night), Disp: 180 tablet, Rfl: 1 .  ergocalciferol (VITAMIN D2) 1.25 MG (50000 UT) capsule, Take 50,000 Units by mouth once a week., Disp: , Rfl:   Review of Systems:  Constitutional: Denies fever, chills, diaphoresis, appetite change and fatigue.  HEENT: Denies photophobia, eye pain, redness, hearing loss, ear pain, congestion, sore throat, rhinorrhea, sneezing, mouth sores, trouble swallowing, neck pain, neck stiffness and tinnitus.   Respiratory: Denies SOB, DOE, cough, chest tightness,  and wheezing.   Cardiovascular: Denies chest pain, palpitations and leg swelling.  Gastrointestinal: Denies nausea, vomiting, abdominal pain, diarrhea, constipation, blood in stool and abdominal distention.  Genitourinary: Denies dysuria, urgency, frequency, hematuria, flank pain and difficulty urinating.  Endocrine: Denies: hot or cold intolerance, sweats, changes in hair or nails, polyuria, polydipsia. Musculoskeletal: Denies myalgias, back pain, joint swelling, arthralgias and gait problem.  Skin: Denies pallor, rash and wound.  Neurological: Denies dizziness, seizures, syncope, weakness, light-headedness, numbness and headaches.  Hematological: Denies adenopathy. Easy bruising, personal or family bleeding history  Psychiatric/Behavioral: Denies suicidal ideation, mood changes, confusion, nervousness, sleep disturbance and agitation    Physical Exam: Vitals:   02/15/19 1019  BP: 130/80  Pulse: 81  Temp: 98 F (36.7 C)  TempSrc: Temporal  SpO2: 97%  Weight: 257 lb 12.8 oz (116.9 kg)  Height: '5\' 10"'  (1.778 m)  Body mass index is 36.99 kg/m.   Constitutional: NAD, calm, comfortable Eyes: PERRL, lids and conjunctivae  Respiratory: clear to auscultation bilaterally, no wheezing, no crackles. Normal respiratory effort. No accessory muscle use.  Cardiovascular: Regular rate and rhythm, no murmurs / rubs / gallops. No extremity edema. 2+ pedal pulses.    Abdomen: no tenderness, no masses palpated. No hepatosplenomegaly. Bowel sounds positive.  Musculoskeletal: no clubbing / cyanosis. No joint deformity upper and lower extremities. Good ROM, no contractures. Normal muscle tone.  Skin: no rashes, lesions, ulcers. No induration Neurologic: Grossly intact and nonfocal Psychiatric: Normal judgment and insight. Alert and oriented x 3. Normal mood.    Impression and Plan:  New onset type 2 diabetes mellitus ([place])  -She has been congratulated on reducing her A1c from 12.2-6.0. -Continue metformin 1000 mg twice daily and glipizide 5 mg twice daily. -She will again follow-up in 3 months.  Morbid obesity ([place]) -Discussed healthy lifestyle, including increased physical activity and better food choices to promote weight loss.  Vitamin D deficiency -Recheck vitamin D levels when she returns for physical in 3 months.  Transaminitis -Recheck LFTs with physical in 3 months.  Hyperlipidemia associated with type 2 diabetes mellitus ([facility]) -Continue Lipitor, recheck LFTs when she returns for physical.    Patient Instructions  -Nice seeing you today!!  -Great job in getting your A1c under control.  -Consider receiving your flu vaccine.  -Schedule follow up in 3 months for your physical. Please come in fasting that day.     Lelon Frohlich, MD [facility] at [facility]

## 2019-03-26 ENCOUNTER — Other Ambulatory Visit: Payer: Self-pay | Admitting: Internal Medicine

## 2019-03-26 DIAGNOSIS — E1169 Type 2 diabetes mellitus with other specified complication: Secondary | ICD-10-CM

## 2019-04-04 ENCOUNTER — Other Ambulatory Visit: Payer: Self-pay

## 2019-04-04 MED ORDER — ACCU-CHEK GUIDE VI STRP: ORAL_STRIP | 12 refills | Status: DC

## 2019-04-04 MED ORDER — ACCU-CHEK GUIDE W/DEVICE KIT: 1.0000 | PACK | Freq: Every day | 0 refills | Status: AC

## 2019-05-18 ENCOUNTER — Ambulatory Visit (INDEPENDENT_AMBULATORY_CARE_PROVIDER_SITE_OTHER): Payer: Medicaid Other | Admitting: Internal Medicine

## 2019-05-18 ENCOUNTER — Other Ambulatory Visit: Payer: Self-pay

## 2019-05-18 ENCOUNTER — Encounter: Payer: Self-pay | Admitting: Internal Medicine

## 2019-05-18 VITALS — BP 120/88 | HR 86 | Temp 97.8°F | Wt 250.6 lb

## 2019-05-18 DIAGNOSIS — E119 Type 2 diabetes mellitus without complications: Secondary | ICD-10-CM

## 2019-05-18 DIAGNOSIS — E785 Hyperlipidemia, unspecified: Secondary | ICD-10-CM

## 2019-05-18 DIAGNOSIS — E1169 Type 2 diabetes mellitus with other specified complication: Secondary | ICD-10-CM | POA: Diagnosis not present

## 2019-05-18 DIAGNOSIS — R7401 Elevation of levels of liver transaminase levels: Secondary | ICD-10-CM | POA: Diagnosis not present

## 2019-05-18 DIAGNOSIS — M25562 Pain in left knee: Secondary | ICD-10-CM | POA: Diagnosis not present

## 2019-05-18 LAB — POCT GLYCOSYLATED HEMOGLOBIN (HGB A1C): Hemoglobin A1C: 6.3 % — AB (ref 4.0–5.6)

## 2019-05-18 NOTE — Progress Notes (Signed)
Established Patient Office Visit     This visit occurred during the SARS-CoV-2 public health emergency.  Safety protocols were in place, including screening questions prior to the visit, additional usage of staff PPE, and extensive cleaning of exam room while observing appropriate contact time as indicated for disinfecting solutions.    CC/Reason for Visit: 32-monthfollow-up chronic conditions  HPI: Angelica SHADERis a 44y.o. female who is coming in today for the above mentioned reasons. Past Medical History is significant for: Type 2 diabetes diagnosed last April that is now well controlled, hyperlipidemia, transaminitis and obesity.  She had her left knee ACL repair years ago and still in pain and is requesting a referral back to orthopedics.  She would also like a referral to medical weight management clinic as she has been having difficulty losing weight, (our records show that she has lost 8 pounds since her last visit in October) other than that she has no complaints.   Past Medical/Surgical History: Past Medical History:  Diagnosis Date  . Diabetes mellitus ([place]   . Dyslipidemia     Past Surgical History:  Procedure Laterality Date  . CESAREAN SECTION    . KNEE ARTHROCENTESIS      Social History:  reports that she quit smoking about 11 months ago. She has never used smokeless tobacco. She reports previous alcohol use. She reports current drug use. Frequency: 7.00 times per week. Drug: Marijuana.  Allergies: No Known Allergies  Family History:  Family History  Problem Relation Age of Onset  . Diabetes Mother   . Hypertension Mother   . Cancer Maternal Grandmother        throat  . Heart disease Maternal Grandmother      Current Outpatient Medications:  .  atorvastatin (LIPITOR) 80 MG tablet, TAKE ONE TABLET BY MOUTH DAILY, Disp: 90 tablet, Rfl: 0 .  Blood Glucose Monitoring Suppl (ACCU-CHEK GUIDE) w/Device KIT, 1 Device by Does not apply route daily.  E11.9, Disp: 1 kit, Rfl: 0 .  Blood Glucose Monitoring Suppl (ONETOUCH VERIO) w/Device KIT, 1 Device by Does not apply route as directed., Disp: 1 kit, Rfl: 0 .  ergocalciferol (VITAMIN D2) 1.25 MG (50000 UT) capsule, Take 50,000 Units by mouth once a week., Disp: , Rfl:  .  glipiZIDE (GLUCOTROL) 5 MG tablet, Take 1 tablet (5 mg total) by mouth 2 (two) times daily before a meal., Disp: 180 tablet, Rfl: 1 .  glucose blood (ACCU-CHEK GUIDE) test strip, Use as instructed to test blood sugar 2 times daily E11.9, Disp: 100 each, Rfl: 12 .  metFORMIN (GLUCOPHAGE) 1000 MG tablet, Take 1 tablet (1,000 mg total) by mouth 2 (two) times daily with a meal. (Patient taking differently: Take 1,000 mg by mouth 2 (two) times daily with a meal. 1/2 in a.m. and 1 tab at night), Disp: 180 tablet, Rfl: 1  Review of Systems:  Constitutional: Denies fever, chills, diaphoresis, appetite change and fatigue.  HEENT: Denies photophobia, eye pain, redness, hearing loss, ear pain, congestion, sore throat, rhinorrhea, sneezing, mouth sores, trouble swallowing, neck pain, neck stiffness and tinnitus.   Respiratory: Denies SOB, DOE, cough, chest tightness,  and wheezing.   Cardiovascular: Denies chest pain, palpitations and leg swelling.  Gastrointestinal: Denies nausea, vomiting, abdominal pain, diarrhea, constipation, blood in stool and abdominal distention.  Genitourinary: Denies dysuria, urgency, frequency, hematuria, flank pain and difficulty urinating.  Endocrine: Denies: hot or cold intolerance, sweats, changes in hair or nails, polyuria, polydipsia. Musculoskeletal:  Denies myalgias, back pain, joint swelling, arthralgias and gait problem.  Skin: Denies pallor, rash and wound.  Neurological: Denies dizziness, seizures, syncope, weakness, light-headedness, numbness and headaches.  Hematological: Denies adenopathy. Easy bruising, personal or family bleeding history  Psychiatric/Behavioral: Denies suicidal ideation, mood  changes, confusion, nervousness, sleep disturbance and agitation    Physical Exam: Vitals:   05/18/19 1000  BP: 120/88  Pulse: 86  Temp: 97.8 F (36.6 C)  TempSrc: Temporal  SpO2: 97%  Weight: 250 lb 9.6 oz (113.7 kg)    Body mass index is 35.96 kg/m.   Constitutional: NAD, calm, comfortable Eyes: PERRL, lids and conjunctivae normal, wears corrective lenses ENMT: Mucous membranes are moist.  Respiratory: clear to auscultation bilaterally, no wheezing, no crackles. Normal respiratory effort. No accessory muscle use.  Cardiovascular: Regular rate and rhythm, no murmurs / rubs / gallops. No extremity edema.  Neurologic: Grossly intact and nonfocal Psychiatric: Normal judgment and insight. Alert and oriented x 3. Normal mood.    Impression and Plan:  New onset type 2 diabetes mellitus ([place])  -A1c continues to show good control at 6.3. -She decided to decrease Metformin dose to 500 in the morning plus one thousand in the evening, I am okay with this as long as her A1c remains below 7.  Left knee pain, unspecified chronicity  - Plan: AMB referral to orthopedics  Morbid obesity ([place]) -Discussed healthy lifestyle, including increased physical activity and better food choices to promote weight loss.  - Plan: Amb Ref to Medical Weight Management  Hyperlipidemia associated with type 2 diabetes mellitus ([place]) -On statin therapy.  Transaminitis -Check LFTs when she returns for CPE in 3 months.   Patient Instructions  -Nice seeing you today!!  -See you back in 3 months for your physical.       Lelon Frohlich, MD [facility] at [facility]

## 2019-05-18 NOTE — Patient Instructions (Signed)
-  Nice seeing you today!!  -See you back in 3 months for your physical. 

## 2019-05-23 ENCOUNTER — Ambulatory Visit (INDEPENDENT_AMBULATORY_CARE_PROVIDER_SITE_OTHER): Payer: Medicaid Other

## 2019-05-23 ENCOUNTER — Encounter: Payer: Self-pay | Admitting: Orthopaedic Surgery

## 2019-05-23 ENCOUNTER — Other Ambulatory Visit: Payer: Self-pay

## 2019-05-23 ENCOUNTER — Ambulatory Visit (INDEPENDENT_AMBULATORY_CARE_PROVIDER_SITE_OTHER): Payer: Medicaid Other | Admitting: Orthopaedic Surgery

## 2019-05-23 VITALS — Ht 70.5 in | Wt 250.0 lb

## 2019-05-23 DIAGNOSIS — M25562 Pain in left knee: Secondary | ICD-10-CM | POA: Diagnosis not present

## 2019-05-23 DIAGNOSIS — G8929 Other chronic pain: Secondary | ICD-10-CM

## 2019-05-23 NOTE — Progress Notes (Signed)
Office Visit Note   Patient: Angelica Berg           Date of Birth: 1976-04-15           MRN: [id number] Visit Date: 05/23/2019              Requested by: [name], [name], MD [place],  [place] [address] PCP: [name], [name], MD   Assessment & Plan: Visit Diagnoses:  1. Chronic pain of left knee     Plan: Chondromalacia patella left knee.  Prior ACL reconstruction.  By exam and history I think the ACL is intact.  Probably has some early arthritis in the medial compartment, however, I believe her symptoms are related to the patella.  Long discussion regarding diagnosis and treatment options.  She has recently been diagnosed with diabetes with an initial hemoglobin A1c of 12 and she is worked hard to get it to 6.  She like to avoid cortisone.  Has been taking ibuprofen which seems to help.  Also wearing a brace.  Works on her feet during the day.  Would like to try a course of physical therapy and then plan to see her back over the next 4 to 6 weeks if still having a problem.  All questions were answered  Follow-Up Instructions: Return if symptoms worsen or fail to improve.   Orders:  Orders Placed This Encounter  Procedures  . XR KNEE 3 VIEW LEFT   No orders of the defined types were placed in this encounter.     Procedures: No procedures performed   Clinical Data: No additional findings.   Subjective: Chief Complaint  Patient presents with  . Left Knee - Pain  Patient presents today for left knee pain. She has a history of ACL repair years ago. She said that she has pain in her knee that occurs since her ACL injury, but usually just occurs with bad weather. She also has swelling. Now the pain has progressed to the point it hurts every morning. She takes Aleve for pain. She said it occasionally will give way.  Pain seems to be localized along the anterior aspect of her knee.  No recent injury or trauma.  Has difficulty with deep  knee bends and squats  HPI  Review of Systems   Objective: Vital Signs: Ht 5' 10.5" (1.791 m)   Wt 250 lb (113.4 kg)   BMI 35.36 kg/m   Physical Exam Constitutional:      Appearance: She is well-developed.  Eyes:     Pupils: Pupils are equal, round, and reactive to light.  Pulmonary:     Effort: Pulmonary effort is normal.  Skin:    General: Skin is warm and dry.  Neurological:     Mental Status: She is alert and oriented to person, place, and time.  Psychiatric:        Behavior: Behavior normal.     Ortho Exam awake alert and oriented x3.  Comfortable sitting.  Left knee was not hot red warm or swollen.  No effusion.  No medial or lateral joint pain.  No opening with a varus or valgus stress.  Negative anterior drawer sign and negative Lachman's test.  Well-healed incisions from prior ACL surgery.  Does have patellar crepitation and some pain with patella compression  Specialty Comments:  No specialty comments available.  Imaging: XR KNEE 3 VIEW LEFT  Result Date: 05/23/2019 Films of the left knee were obtained in 3  projections standing.  There are metallic interference screws from a prior ACL reconstruction in good position.  A little bit of irregularity along the joint surfaces medial compartment with possibly very slight narrowing compared to the right knee consistent with some early arthritis.  There is a lateral patella tilt and narrowing of the lateral patellofemoral joint.  No ectopic calcification.    PMFS History: Patient Active Problem List   Diagnosis Date Noted  . Pain in left knee 05/23/2019  . New onset type 2 diabetes mellitus ([facility]) 08/30/2018  . Morbid obesity ([facility]) 08/30/2018  . Vitamin D deficiency 08/30/2018  . Hyperlipidemia associated with type 2 diabetes mellitus ([facility]) 08/30/2018  . Transaminitis 08/30/2018   Past Medical History:  Diagnosis Date  . Diabetes mellitus ([facility])   . Dyslipidemia     Family History  Problem Relation Age of Onset   . Diabetes Mother   . Hypertension Mother   . Cancer Maternal Grandmother        throat  . Heart disease Maternal Grandmother     Past Surgical History:  Procedure Laterality Date  . CESAREAN SECTION    . KNEE ARTHROCENTESIS     Social History   Occupational History  . Not on file  Tobacco Use  . Smoking status: Former Smoker    Quit date: 06/12/2018    Years since quitting: 0.9  . Smokeless tobacco: Never Used  Substance and Sexual Activity  . Alcohol use: Not Currently  . Drug use: Yes    Frequency: 7.0 times per week    Types: Marijuana  . Sexual activity: Not Currently

## 2019-05-23 NOTE — Addendum Note (Signed)
Addended by: Wendi Maya on: 05/23/2019 09:44 AM   Modules accepted: Orders

## 2019-06-15 ENCOUNTER — Encounter (HOSPITAL_COMMUNITY): Payer: Self-pay

## 2019-06-15 ENCOUNTER — Other Ambulatory Visit: Payer: Self-pay

## 2019-06-15 ENCOUNTER — Emergency Department (HOSPITAL_COMMUNITY): Payer: No Typology Code available for payment source

## 2019-06-15 ENCOUNTER — Emergency Department (HOSPITAL_COMMUNITY)
Admission: EM | Admit: 2019-06-15 | Discharge: 2019-06-15 | Disposition: A | Discharge status: Home / Self Care | Payer: No Typology Code available for payment source | Attending: Emergency Medicine | Admitting: Emergency Medicine

## 2019-06-15 DIAGNOSIS — E119 Type 2 diabetes mellitus without complications: Secondary | ICD-10-CM | POA: Insufficient documentation

## 2019-06-15 DIAGNOSIS — S46912A Strain of unspecified muscle, fascia and tendon at shoulder and upper arm level, left arm, initial encounter: Secondary | ICD-10-CM | POA: Insufficient documentation

## 2019-06-15 DIAGNOSIS — Y92410 Unspecified street and highway as the place of occurrence of the external cause: Secondary | ICD-10-CM | POA: Insufficient documentation

## 2019-06-15 DIAGNOSIS — Z7984 Long term (current) use of oral hypoglycemic drugs: Secondary | ICD-10-CM | POA: Diagnosis not present

## 2019-06-15 DIAGNOSIS — Y999 Unspecified external cause status: Secondary | ICD-10-CM | POA: Insufficient documentation

## 2019-06-15 DIAGNOSIS — Z87891 Personal history of nicotine dependence: Secondary | ICD-10-CM | POA: Insufficient documentation

## 2019-06-15 DIAGNOSIS — Z79899 Other long term (current) drug therapy: Secondary | ICD-10-CM | POA: Diagnosis not present

## 2019-06-15 DIAGNOSIS — Y93I9 Activity, other involving external motion: Secondary | ICD-10-CM | POA: Insufficient documentation

## 2019-06-15 DIAGNOSIS — M25512 Pain in left shoulder: Secondary | ICD-10-CM | POA: Diagnosis not present

## 2019-06-15 DIAGNOSIS — M1612 Unilateral primary osteoarthritis, left hip: Secondary | ICD-10-CM | POA: Diagnosis not present

## 2019-06-15 DIAGNOSIS — S4992XA Unspecified injury of left shoulder and upper arm, initial encounter: Secondary | ICD-10-CM | POA: Diagnosis present

## 2019-06-15 MED ORDER — ACETAMINOPHEN 325 MG PO TABS: 650.0000 mg | ORAL_TABLET | Freq: Once | ORAL | Status: DC

## 2019-06-15 MED ORDER — IBUPROFEN 200 MG PO TABS
600.0000 mg | ORAL_TABLET | Freq: Once | ORAL | Status: AC
  Administered 2019-06-15: 600 mg via ORAL
  Filled 2019-06-15: qty 3
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
## 2019-06-15 NOTE — Discharge Instructions (Addendum)
Recommend Tylenol, Motrin as needed for pain control.  Recommend follow-up with primary doctor as needed.  If you develop any chest pain, difficulty breathing, neck pain, other new concerning symptom recommend return to ER for further recheck.

## 2019-06-15 NOTE — ED Notes (Signed)
Pt is requesting that Tylenol be changed to Ibuprofen. Dr. Stevie Kern made aware.

## 2019-06-16 ENCOUNTER — Encounter: Payer: Self-pay | Admitting: Adult Health

## 2019-06-16 ENCOUNTER — Telehealth (INDEPENDENT_AMBULATORY_CARE_PROVIDER_SITE_OTHER): Payer: Medicaid Other | Admitting: Adult Health

## 2019-06-16 DIAGNOSIS — Z041 Encounter for examination and observation following transport accident: Secondary | ICD-10-CM | POA: Diagnosis not present

## 2019-06-16 MED ORDER — IBUPROFEN 800 MG PO TABS: 800.0000 mg | ORAL_TABLET | Freq: Three times a day (TID) | ORAL | 0 refills | Status: AC | PRN

## 2019-06-16 MED ORDER — TIZANIDINE HCL 2 MG PO TABS: 2.0000 mg | ORAL_TABLET | Freq: Three times a day (TID) | ORAL | 0 refills | Status: AC | PRN
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
## 2019-07-25 ENCOUNTER — Ambulatory Visit: Payer: PRIVATE HEALTH INSURANCE | Admitting: Registered"

## 2019-08-15 ENCOUNTER — Other Ambulatory Visit: Payer: Self-pay

## 2019-08-16 ENCOUNTER — Ambulatory Visit (INDEPENDENT_AMBULATORY_CARE_PROVIDER_SITE_OTHER): Payer: Medicaid Other | Admitting: Internal Medicine

## 2019-08-16 ENCOUNTER — Other Ambulatory Visit: Payer: Self-pay | Admitting: Internal Medicine

## 2019-08-16 ENCOUNTER — Encounter: Payer: Self-pay | Admitting: Internal Medicine

## 2019-08-16 VITALS — BP 130/90 | HR 80 | Temp 97.5°F | Ht 69.0 in | Wt 248.1 lb

## 2019-08-16 DIAGNOSIS — Z0001 Encounter for general adult medical examination with abnormal findings: Secondary | ICD-10-CM

## 2019-08-16 DIAGNOSIS — E559 Vitamin D deficiency, unspecified: Secondary | ICD-10-CM

## 2019-08-16 DIAGNOSIS — E785 Hyperlipidemia, unspecified: Secondary | ICD-10-CM

## 2019-08-16 DIAGNOSIS — H6122 Impacted cerumen, left ear: Secondary | ICD-10-CM

## 2019-08-16 DIAGNOSIS — R221 Localized swelling, mass and lump, neck: Secondary | ICD-10-CM

## 2019-08-16 DIAGNOSIS — R7401 Elevation of levels of liver transaminase levels: Secondary | ICD-10-CM

## 2019-08-16 DIAGNOSIS — E1169 Type 2 diabetes mellitus with other specified complication: Secondary | ICD-10-CM

## 2019-08-16 DIAGNOSIS — E119 Type 2 diabetes mellitus without complications: Secondary | ICD-10-CM

## 2019-08-16 DIAGNOSIS — Z0000 Encounter for general adult medical examination without abnormal findings: Secondary | ICD-10-CM

## 2019-08-16 LAB — COMPREHENSIVE METABOLIC PANEL
ALT: 18 U/L (ref 0–35)
AST: 16 U/L (ref 0–37)
Albumin: 4.3 g/dL (ref 3.5–5.2)
Alkaline Phosphatase: 58 U/L (ref 39–117)
BUN: 10 mg/dL (ref 6–23)
CO2: 32 mEq/L (ref 19–32)
Calcium: 9.3 mg/dL (ref 8.4–10.5)
Chloride: 100 mEq/L (ref 96–112)
Creatinine, Ser: 0.68 mg/dL (ref 0.40–1.20)
GFR: 114.76 mL/min (ref >60.00)
Glucose, Bld: 200 mg/dL — ABNORMAL HIGH (ref 70–99)
Potassium: 4.1 mEq/L (ref 3.5–5.1)
Sodium: 138 mEq/L (ref 135–145)
Total Bilirubin: 1 mg/dL (ref 0.2–1.2)
Total Protein: 7.4 g/dL (ref 6.0–8.3)

## 2019-08-16 LAB — LIPID PANEL
Cholesterol: 283 mg/dL — ABNORMAL HIGH (ref 0–200)
HDL: 55.4 mg/dL (ref >39.00)
LDL Cholesterol: 188 mg/dL — ABNORMAL HIGH (ref 0–99)
NonHDL: 227.35
Total CHOL/HDL Ratio: 5
Triglycerides: 196 mg/dL — ABNORMAL HIGH (ref 0.0–149.0)
VLDL: 39.2 mg/dL (ref 0.0–40.0)

## 2019-08-16 LAB — CBC WITH DIFFERENTIAL/PLATELET
Basophils Absolute: 0 10*3/uL (ref 0.0–0.1)
Basophils Relative: 0.5 % (ref 0.0–3.0)
Eosinophils Absolute: 0.1 10*3/uL (ref 0.0–0.7)
Eosinophils Relative: 2.1 % (ref 0.0–5.0)
HCT: 40.3 % (ref 36.0–46.0)
Hemoglobin: 13.8 g/dL (ref 12.0–15.0)
Lymphocytes Relative: 55.3 % — ABNORMAL HIGH (ref 12.0–46.0)
Lymphs Abs: 3.6 10*3/uL (ref 0.7–4.0)
MCHC: 34.2 g/dL (ref 30.0–36.0)
MCV: 91.7 fl (ref 78.0–100.0)
Monocytes Absolute: 0.4 10*3/uL (ref 0.1–1.0)
Monocytes Relative: 5.5 % (ref 3.0–12.0)
Neutro Abs: 2.4 10*3/uL (ref 1.4–7.7)
Neutrophils Relative %: 36.6 % — ABNORMAL LOW (ref 43.0–77.0)
Platelets: 267 10*3/uL (ref 150.0–400.0)
RBC: 4.4 Mil/uL (ref 3.87–5.11)
RDW: 12.9 % (ref 11.5–15.5)
WBC: 6.5 10*3/uL (ref 4.0–10.5)

## 2019-08-16 LAB — HEMOGLOBIN A1C: Hgb A1c MFr Bld: 7.9 % — ABNORMAL HIGH (ref 4.6–6.5)

## 2019-08-16 LAB — T3, FREE: T3, Free: 3.3 pg/mL (ref 2.3–4.2)

## 2019-08-16 LAB — TSH: TSH: 1.29 u[IU]/mL (ref 0.35–4.50)

## 2019-08-16 LAB — T4, FREE: Free T4: 0.96 ng/dL (ref 0.60–1.60)

## 2019-08-16 LAB — VITAMIN B12: Vitamin B-12: 309 pg/mL (ref 211–911)

## 2019-08-16 LAB — VITAMIN D 25 HYDROXY (VIT D DEFICIENCY, FRACTURES): VITD: 11.48 ng/mL — ABNORMAL LOW (ref 30.00–100.00)

## 2019-08-16 MED ORDER — ERGOCALCIFEROL 1.25 MG (50000 UT) PO CAPS: 50000.0000 [IU] | ORAL_CAPSULE | ORAL | 0 refills | Status: DC

## 2019-08-16 NOTE — Addendum Note (Signed)
Addended by: Bonnye Fava on: 08/16/2019 09:37 AM   Modules accepted: Orders

## 2019-08-16 NOTE — Patient Instructions (Signed)
-Nice seeing you today!!  -Lab work today; will notify you once results are available.  -Make sure you start back on your medications.  -I would recommend you get your COVID vaccines.  -Schedule a 3 month follow up.   Preventive Care 12-42 Years Old, Female Preventive care refers to visits with your health care provider and lifestyle choices that can promote health and wellness. This includes:  A yearly physical exam. This may also be called an annual well check.  Regular dental visits and eye exams.  Immunizations.  Screening for certain conditions.  Healthy lifestyle choices, such as eating a healthy diet, getting regular exercise, not using drugs or products that contain nicotine and tobacco, and limiting alcohol use. What can I expect for my preventive care visit? Physical exam Your health care provider will check your:  Height and weight. This may be used to calculate body mass index (BMI), which tells if you are at a healthy weight.  Heart rate and blood pressure.  Skin for abnormal spots. Counseling Your health care provider may ask you questions about your:  Alcohol, tobacco, and drug use.  Emotional well-being.  Home and relationship well-being.  Sexual activity.  Eating habits.  Work and work Statistician.  Method of birth control.  Menstrual cycle.  Pregnancy history. What immunizations do I need?  Influenza (flu) vaccine  This is recommended every year. Tetanus, diphtheria, and pertussis (Tdap) vaccine  You may need a Td booster every 10 years. Varicella (chickenpox) vaccine  You may need this if you have not been vaccinated. Zoster (shingles) vaccine  You may need this after age 72. Measles, mumps, and rubella (MMR) vaccine  You may need at least one dose of MMR if you were born in 1957 or later. You may also need a second dose. Pneumococcal conjugate (PCV13) vaccine  You may need this if you have certain conditions and were not  previously vaccinated. Pneumococcal polysaccharide (PPSV23) vaccine  You may need one or two doses if you smoke cigarettes or if you have certain conditions. Meningococcal conjugate (MenACWY) vaccine  You may need this if you have certain conditions. Hepatitis A vaccine  You may need this if you have certain conditions or if you travel or work in places where you may be exposed to hepatitis A. Hepatitis B vaccine  You may need this if you have certain conditions or if you travel or work in places where you may be exposed to hepatitis B. Haemophilus influenzae type b (Hib) vaccine  You may need this if you have certain conditions. Human papillomavirus (HPV) vaccine  If recommended by your health care provider, you may need three doses over 6 months. You may receive vaccines as individual doses or as more than one vaccine together in one shot (combination vaccines). Talk with your health care provider about the risks and benefits of combination vaccines. What tests do I need? Blood tests  Lipid and cholesterol levels. These may be checked every 5 years, or more frequently if you are over 21 years old.  Hepatitis C test.  Hepatitis B test. Screening  Lung cancer screening. You may have this screening every year starting at age 100 if you have a 30-pack-year history of smoking and currently smoke or have quit within the past 15 years.  Colorectal cancer screening. All adults should have this screening starting at age 79 and continuing until age 51. Your health care provider may recommend screening at age 19 if you are at increased risk.  You will have tests every 1-10 years, depending on your results and the type of screening test.  Diabetes screening. This is done by checking your blood sugar (glucose) after you have not eaten for a while (fasting). You may have this done every 1-3 years.  Mammogram. This may be done every 1-2 years. Talk with your health care provider about when you  should start having regular mammograms. This may depend on whether you have a family history of breast cancer.  BRCA-related cancer screening. This may be done if you have a family history of breast, ovarian, tubal, or peritoneal cancers.  Pelvic exam and Pap test. This may be done every 3 years starting at age 21. Starting at age 30, this may be done every 5 years if you have a Pap test in combination with an HPV test. Other tests  Sexually transmitted disease (STD) testing.  Bone density scan. This is done to screen for osteoporosis. You may have this scan if you are at high risk for osteoporosis. Follow these instructions at home: Eating and drinking  Eat a diet that includes fresh fruits and vegetables, whole grains, lean protein, and low-fat dairy.  Take vitamin and mineral supplements as recommended by your health care provider.  Do not drink alcohol if: ? Your health care provider tells you not to drink. ? You are pregnant, may be pregnant, or are planning to become pregnant.  If you drink alcohol: ? Limit how much you have to 0-1 drink a day. ? Be aware of how much alcohol is in your drink. In the [place] one drink equals one 12 oz bottle of beer (355 mL), one 5 oz glass of wine (148 mL), or one 1 oz glass of hard liquor (44 mL). Lifestyle  Take daily care of your teeth and gums.  Stay active. Exercise for at least 30 minutes on 5 or more days each week.  Do not use any products that contain nicotine or tobacco, such as cigarettes, e-cigarettes, and chewing tobacco. If you need help quitting, ask your health care provider.  If you are sexually active, practice safe sex. Use a condom or other form of birth control (contraception) in order to prevent pregnancy and STIs (sexually transmitted infections).  If told by your health care provider, take low-dose aspirin daily starting at age 50. What's next?  Visit your health care provider once a year for a well check  visit.  Ask your health care provider how often you should have your eyes and teeth checked.  Stay up to date on all vaccines. This information is not intended to replace advice given to you by your health care provider. Make sure you discuss any questions you have with your health care provider. Document Revised: 12/16/2017 Document Reviewed: 12/16/2017 Elsevier Patient Education  2020 [employer]  

## 2019-08-16 NOTE — Progress Notes (Signed)
Established Patient Office Visit     This visit occurred during the SARS-CoV-2 public health emergency.  Safety protocols were in place, including screening questions prior to the visit, additional usage of staff PPE, and extensive cleaning of exam room while observing appropriate contact time as indicated for disinfecting solutions.    CC/Reason for Visit: Annual preventive exam and 21-monthfollow-up chronic medical conditions  HPI: Angelica LAFAVEis a 44y.o. female who is coming in today for the above mentioned reasons. Past Medical History is significant for: Type 2 diabetes diagnosed in April 2020, hyperlipidemia, transaminitis, morbid obesity.  She has no complaints today.  She did decide to stop taking all her medications above month ago she felt overwhelmed, she is fasting today, she has routine eye and dental care.  She is getting her eye exam next month.  She has been having issues hearing out of her left ear.  She has lots of questions about the Covid vaccine today.  She had a mammogram in May 2020, she has routine GYN care.   Past Medical/Surgical History: Past Medical History:  Diagnosis Date  . [place]   . Dyslipidemia     Past Surgical History:  Procedure Laterality Date  . CESAREAN SECTION    . KNEE ARTHROCENTESIS      Social History:  reports that she quit smoking about 14 months ago. She has never used smokeless tobacco. She reports previous alcohol use. She reports current drug use. Frequency: 7.00 times per week. Drug: Marijuana.  Allergies: No Known Allergies  Family History:  Family History  Problem Relation Age of Onset  . Diabetes Mother   . Hypertension Mother   . Cancer Maternal Grandmother        throat  . Heart disease Maternal Grandmother      Current Outpatient Medications:  .  atorvastatin (LIPITOR) 80 MG tablet, TAKE ONE TABLET BY MOUTH DAILY (Patient not taking: Reported on 08/16/2019), Disp: 90 tablet, Rfl: 0 .   Blood Glucose Monitoring Suppl (ACCU-CHEK GUIDE) w/Device KIT, 1 Device by Does not apply route daily. E11.9 (Patient not taking: Reported on 08/16/2019), Disp: 1 kit, Rfl: 0 .  Blood Glucose Monitoring Suppl (ONETOUCH VERIO) w/Device KIT, 1 Device by Does not apply route as directed. (Patient not taking: Reported on 08/16/2019), Disp: 1 kit, Rfl: 0 .  ergocalciferol (VITAMIN D2) 1.25 MG (50000 UT) capsule, Take 50,000 Units by mouth once a week., Disp: , Rfl:  .  glipiZIDE (GLUCOTROL) 5 MG tablet, Take 1 tablet (5 mg total) by mouth 2 (two) times daily before a meal. (Patient not taking: Reported on 08/16/2019), Disp: 180 tablet, Rfl: 1 .  glucose blood (ACCU-CHEK GUIDE) test strip, Use as instructed to test blood sugar 2 times daily E11.9 (Patient not taking: Reported on 08/16/2019), Disp: 100 each, Rfl: 12 .  metFORMIN (GLUCOPHAGE) 1000 MG tablet, Take 1 tablet (1,000 mg total) by mouth 2 (two) times daily with a meal. (Patient not taking: Reported on 08/16/2019), Disp: 180 tablet, Rfl: 1  Review of Systems:  Constitutional: Denies fever, chills, diaphoresis, appetite change and fatigue.  HEENT: Denies photophobia, eye pain, redness, hearing loss, ear pain, congestion, sore throat, rhinorrhea, sneezing, mouth sores, trouble swallowing, neck pain, neck stiffness and tinnitus.   Respiratory: Denies SOB, DOE, cough, chest tightness,  and wheezing.   Cardiovascular: Denies chest pain, palpitations and leg swelling.  Gastrointestinal: Denies nausea, vomiting, abdominal pain, diarrhea, constipation, blood in stool and abdominal distention.  Genitourinary: Denies dysuria, urgency, frequency, hematuria, flank pain and difficulty urinating.  Endocrine: Denies: hot or cold intolerance, sweats, changes in hair or nails, polyuria, polydipsia. Musculoskeletal: Denies myalgias, back pain, joint swelling, arthralgias and gait problem.  Skin: Denies pallor, rash and wound.  Neurological: Denies dizziness, seizures,  syncope, weakness, light-headedness, numbness and headaches.  Hematological: Denies adenopathy. Easy bruising, personal or family bleeding history  Psychiatric/Behavioral: Denies suicidal ideation, mood changes, confusion, nervousness, sleep disturbance and agitation    Physical Exam: Vitals:   08/16/19 0854  BP: 130/90  Pulse: 80  Temp: (!) 97.5 F (36.4 C)  TempSrc: Temporal  SpO2: 96%  Weight: 248 lb 1.6 oz (112.5 kg)  Height: _0  (1.753 m)    Body mass index is 36.64 kg/m.   Constitutional: NAD, calm, comfortable, obese Eyes: PERRL, lids and conjunctivae normal, wears corrective lenses ENMT: Mucous membranes are moist.  Tympanic membrane is pearly white, no erythema or bulging on the right, left is obstructed by cerumen Neck: normal, supple, significant neck fullness and presumed thyromegaly Respiratory: clear to auscultation bilaterally, no wheezing, no crackles. Normal respiratory effort. No accessory muscle use.  Cardiovascular: Regular rate and rhythm, no murmurs / rubs / gallops. No extremity edema. 2+ pedal pulses.  Abdomen: no tenderness, no masses palpated. No hepatosplenomegaly. Bowel sounds positive.  Musculoskeletal: no clubbing / cyanosis. No joint deformity upper and lower extremities. Good ROM, no contractures. Normal muscle tone.  Skin: no rashes, lesions, ulcers. No induration Neurologic: CN 2-12 grossly intact. Sensation intact, DTR normal. Strength 5/5 in all 4.  Psychiatric: Normal judgment and insight. Alert and oriented x 3. Normal mood.    Impression and Plan:  Encounter for preventive health examination -I have advised routine eye and dental care. -She is due for Covid, flu and Tdap vaccines.  She will consider Covid vaccine. -Screening labs today. -Healthy lifestyle discussed in detail. -Commence routine colon cancer screening at age 11. -Her mammogram was normal in May 2020. -She sees GYN for cervical cancer screening.  New onset type 2  diabetes mellitus ([place]) -Suspect her A1c will be higher today as she is no longer taking medication. -She will resume use of glipizide and Metformin, check A1c today. -Return in 3 months for follow-up.  Hyperlipidemia associated with type 2 diabetes mellitus ([place])  - Plan: Lipid panel -She will resume atorvastatin.  Morbid obesity ([place]) -Discussed healthy lifestyle, including increased physical activity and better food choices to promote weight loss.  Vitamin D deficiency -Check levels today.  Transaminitis - -check levels today.  Left ear impacted cerumen -Cerumen Desimpaction  After patient consent was obtained, warm water was applied and gentle ear lavage performed on left ear. There were no complications and following the desimpaction the tympanic membranes were visible. Tympanic membranes are intact following the procedure. Auditory canals are normal. The patient reported relief of symptoms after removal of cerumen.   Neck fullness  - Plan: TSH, T3, free, T4, free   Patient Instructions  -Nice seeing you today!!  -Lab work today; will notify you once results are available.  -Make sure you start back on your medications.  -I would recommend you get your COVID vaccines.  -Schedule a 3 month follow up.   Preventive Care 21-53 Years Old, Female Preventive care refers to visits with your health care provider and lifestyle choices that can promote health and wellness. This includes:  A yearly physical exam. This may also be called an annual well check.  Regular dental visits and  eye exams.  Immunizations.  Screening for certain conditions.  Healthy lifestyle choices, such as eating a healthy diet, getting regular exercise, not using drugs or products that contain nicotine and tobacco, and limiting alcohol use. What can I expect for my preventive care visit? Physical exam Your health care provider will check your:  Height and weight. This may be used to calculate  body mass index (BMI), which tells if you are at a healthy weight.  Heart rate and blood pressure.  Skin for abnormal spots. Counseling Your health care provider may ask you questions about your:  Alcohol, tobacco, and drug use.  Emotional well-being.  Home and relationship well-being.  Sexual activity.  Eating habits.  Work and work Statistician.  Method of birth control.  Menstrual cycle.  Pregnancy history. What immunizations do I need?  Influenza (flu) vaccine  This is recommended every year. Tetanus, diphtheria, and pertussis (Tdap) vaccine  You may need a Td booster every 10 years. Varicella (chickenpox) vaccine  You may need this if you have not been vaccinated. Zoster (shingles) vaccine  You may need this after age 34. Measles, mumps, and rubella (MMR) vaccine  You may need at least one dose of MMR if you were born in 1957 or later. You may also need a second dose. Pneumococcal conjugate (PCV13) vaccine  You may need this if you have certain conditions and were not previously vaccinated. Pneumococcal polysaccharide (PPSV23) vaccine  You may need one or two doses if you smoke cigarettes or if you have certain conditions. Meningococcal conjugate (MenACWY) vaccine  You may need this if you have certain conditions. Hepatitis A vaccine  You may need this if you have certain conditions or if you travel or work in places where you may be exposed to hepatitis A. Hepatitis B vaccine  You may need this if you have certain conditions or if you travel or work in places where you may be exposed to hepatitis B. Haemophilus influenzae type b (Hib) vaccine  You may need this if you have certain conditions. Human papillomavirus (HPV) vaccine  If recommended by your health care provider, you may need three doses over 6 months. You may receive vaccines as individual doses or as more than one vaccine together in one shot (combination vaccines). Talk with your health  care provider about the risks and benefits of combination vaccines. What tests do I need? Blood tests  Lipid and cholesterol levels. These may be checked every 5 years, or more frequently if you are over 57 years old.  Hepatitis C test.  Hepatitis B test. Screening  Lung cancer screening. You may have this screening every year starting at age 24 if you have a 30-pack-year history of smoking and currently smoke or have quit within the past 15 years.  Colorectal cancer screening. All adults should have this screening starting at age 27 and continuing until age 48. Your health care provider may recommend screening at age 8 if you are at increased risk. You will have tests every 1-10 years, depending on your results and the type of screening test.  Diabetes screening. This is done by checking your blood sugar (glucose) after you have not eaten for a while (fasting). You may have this done every 1-3 years.  Mammogram. This may be done every 1-2 years. Talk with your health care provider about when you should start having regular mammograms. This may depend on whether you have a family history of breast cancer.  BRCA-related cancer screening. This  may be done if you have a family history of breast, ovarian, tubal, or peritoneal cancers.  Pelvic exam and Pap test. This may be done every 3 years starting at age 56. Starting at age 58, this may be done every 5 years if you have a Pap test in combination with an HPV test. Other tests  Sexually transmitted disease (STD) testing.  Bone density scan. This is done to screen for osteoporosis. You may have this scan if you are at high risk for osteoporosis. Follow these instructions at home: Eating and drinking  Eat a diet that includes fresh fruits and vegetables, whole grains, lean protein, and low-fat dairy.  Take vitamin and mineral supplements as recommended by your health care provider.  Do not drink alcohol if: ? Your health care provider  tells you not to drink. ? You are pregnant, may be pregnant, or are planning to become pregnant.  If you drink alcohol: ? Limit how much you have to 0-1 drink a day. ? Be aware of how much alcohol is in your drink. In the [place] one drink equals one 12 oz bottle of beer (355 mL), one 5 oz glass of wine (148 mL), or one 1 oz glass of hard liquor (44 mL). Lifestyle  Take daily care of your teeth and gums.  Stay active. Exercise for at least 30 minutes on 5 or more days each week.  Do not use any products that contain nicotine or tobacco, such as cigarettes, e-cigarettes, and chewing tobacco. If you need help quitting, ask your health care provider.  If you are sexually active, practice safe sex. Use a condom or other form of birth control (contraception) in order to prevent pregnancy and STIs (sexually transmitted infections).  If told by your health care provider, take low-dose aspirin daily starting at age 1. What's next?  Visit your health care provider once a year for a well check visit.  Ask your health care provider how often you should have your eyes and teeth checked.  Stay up to date on all vaccines. This information is not intended to replace advice given to you by your health care provider. Make sure you discuss any questions you have with your health care provider. Document Revised: 12/16/2017 Document Reviewed: 12/16/2017 Elsevier Patient Education  2020 [place], [place] [facility]

## 2019-08-25 ENCOUNTER — Telehealth: Payer: Self-pay | Admitting: Internal Medicine

## 2019-08-25 DIAGNOSIS — E119 Type 2 diabetes mellitus without complications: Secondary | ICD-10-CM

## 2019-08-25 NOTE — Telephone Encounter (Signed)
Philippa Chester is calling from [facility] needing a referral for the pt that has an appointment on Monday at 9:00, she was advised to let the pt know she may need to call her insurance company as well.

## 2019-08-25 NOTE — Telephone Encounter (Signed)
Referral placed.

## 2019-09-04 ENCOUNTER — Other Ambulatory Visit: Payer: Self-pay | Admitting: Internal Medicine

## 2019-09-04 DIAGNOSIS — E785 Hyperlipidemia, unspecified: Secondary | ICD-10-CM

## 2019-09-04 DIAGNOSIS — E1169 Type 2 diabetes mellitus with other specified complication: Secondary | ICD-10-CM

## 2019-09-04 DIAGNOSIS — E119 Type 2 diabetes mellitus without complications: Secondary | ICD-10-CM

## 2019-10-12 ENCOUNTER — Other Ambulatory Visit: Payer: Self-pay | Admitting: Internal Medicine

## 2019-10-12 ENCOUNTER — Telehealth: Payer: Self-pay | Admitting: Internal Medicine

## 2019-10-12 DIAGNOSIS — E119 Type 2 diabetes mellitus without complications: Secondary | ICD-10-CM

## 2019-10-12 MED ORDER — GLIPIZIDE 5 MG PO TABS: ORAL_TABLET | ORAL | 1 refills | Status: DC

## 2019-10-12 NOTE — Telephone Encounter (Signed)
Pt is calling wanting to know why her glipizide 5 MG was denied and would like to have a call back.

## 2019-10-25 LAB — HM DIABETES EYE EXAM

## 2019-11-07 ENCOUNTER — Encounter: Payer: Self-pay | Admitting: Internal Medicine

## 2019-11-15 ENCOUNTER — Ambulatory Visit (INDEPENDENT_AMBULATORY_CARE_PROVIDER_SITE_OTHER): Payer: Medicaid Other | Admitting: Internal Medicine

## 2019-11-15 ENCOUNTER — Other Ambulatory Visit: Payer: Self-pay

## 2019-11-15 ENCOUNTER — Encounter: Payer: Self-pay | Admitting: Internal Medicine

## 2019-11-15 VITALS — BP 120/84 | HR 88 | Temp 98.7°F | Wt 254.0 lb

## 2019-11-15 DIAGNOSIS — E119 Type 2 diabetes mellitus without complications: Secondary | ICD-10-CM

## 2019-11-15 DIAGNOSIS — E785 Hyperlipidemia, unspecified: Secondary | ICD-10-CM | POA: Diagnosis not present

## 2019-11-15 DIAGNOSIS — M5442 Lumbago with sciatica, left side: Secondary | ICD-10-CM

## 2019-11-15 DIAGNOSIS — E1169 Type 2 diabetes mellitus with other specified complication: Secondary | ICD-10-CM | POA: Diagnosis not present

## 2019-11-15 DIAGNOSIS — E559 Vitamin D deficiency, unspecified: Secondary | ICD-10-CM | POA: Diagnosis not present

## 2019-11-15 DIAGNOSIS — R635 Abnormal weight gain: Secondary | ICD-10-CM | POA: Diagnosis not present

## 2019-11-15 LAB — POCT GLYCOSYLATED HEMOGLOBIN (HGB A1C): Hemoglobin A1C: 6.2 % — AB (ref 4.0–5.6)

## 2019-11-15 LAB — POCT URINE PREGNANCY: Preg Test, Ur: NEGATIVE

## 2019-11-15 LAB — VITAMIN D 25 HYDROXY (VIT D DEFICIENCY, FRACTURES): Vit D, 25-Hydroxy: 18 ng/mL — ABNORMAL LOW (ref 30–100)

## 2019-11-15 MED ORDER — CYCLOBENZAPRINE HCL 5 MG PO TABS: 5.0000 mg | ORAL_TABLET | Freq: Two times a day (BID) | ORAL | 0 refills | Status: DC | PRN

## 2019-11-15 NOTE — Patient Instructions (Signed)
-Nice seeing you today!!  -Lab work today; will notify you once results are available.  -Referral to weight management clinic.  -for your back and hip: icing 2-3 times a day for 15 minutes, ibuprofen as needed and back stretches daily. Also try a muscle relaxer at bedtime. If no better in about 2 weeks, let me know n=and we can refer you to physical therapy. Stop walking for the next 2 weeks.  -Schedule follow up in 3 months.   Sciatica  Sciatica is pain, weakness, tingling, or loss of feeling (numbness) along the sciatic nerve. The sciatic nerve starts in the lower back and goes down the back of each leg. Sciatica usually goes away on its own or with treatment. Sometimes, sciatica may come back (recur). What are the causes? This condition happens when the sciatic nerve is pinched or has pressure put on it. This may be the result of:  A disk in between the bones of the spine bulging out too far (herniated disk).  Changes in the spinal disks that occur with aging.  A condition that affects a muscle in the butt.  Extra bone growth near the sciatic nerve.  A break (fracture) of the area between your hip bones (pelvis).  Pregnancy.  Tumor. This is rare. What increases the risk? You are more likely to develop this condition if you:  Play sports that put pressure or stress on the spine.  Have poor strength and ease of movement (flexibility).  Have had a back injury in the past.  Have had back surgery.  Sit for long periods of time.  Do activities that involve bending or lifting over and over again.  Are very overweight (obese). What are the signs or symptoms? Symptoms can vary from mild to very bad. They may include:  Any of these problems in the lower back, leg, hip, or butt: ? Mild tingling, loss of feeling, or dull aches. ? Burning sensations. ? Sharp pains.  Loss of feeling in the back of the calf or the sole of the foot.  Leg weakness.  Very bad back pain that  makes it hard to move. These symptoms may get worse when you cough, sneeze, or laugh. They may also get worse when you sit or stand for long periods of time. How is this treated? This condition often gets better without any treatment. However, treatment may include:  Changing or cutting back on physical activity when you have pain.  Doing exercises and stretching.  Putting ice or heat on the affected area.  Medicines that help: ? To relieve pain and swelling. ? To relax your muscles.  Shots (injections) of medicines that help to relieve pain, irritation, and swelling.  Surgery. Follow these instructions at home: Medicines  Take over-the-counter and prescription medicines only as told by your doctor.  Ask your doctor if the medicine prescribed to you: ? Requires you to avoid driving or using heavy machinery. ? Can cause trouble pooping (constipation). You may need to take these steps to prevent or treat trouble pooping:  Drink enough fluids to keep your pee (urine) pale yellow.  Take over-the-counter or prescription medicines.  Eat foods that are high in fiber. These include beans, whole grains, and fresh fruits and vegetables.  Limit foods that are high in fat and sugar. These include fried or sweet foods. Managing pain      If told, put ice on the affected area. ? Put ice in a plastic bag. ? Place a towel between  your skin and the bag. ? Leave the ice on for 20 minutes, 2-3 times a day.  If told, put heat on the affected area. Use the heat source that your doctor tells you to use, such as a moist heat pack or a heating pad. ? Place a towel between your skin and the heat source. ? Leave the heat on for 20-30 minutes. ? Remove the heat if your skin turns bright red. This is very important if you are unable to feel pain, heat, or cold. You may have a greater risk of getting burned. Activity   Return to your normal activities as told by your doctor. Ask your doctor  what activities are safe for you.  Avoid activities that make your symptoms worse.  Take short rests during the day. ? When you rest for a long time, do some physical activity or stretching between periods of rest. ? Avoid sitting for a long time without moving. Get up and move around at least one time each hour.  Exercise and stretch regularly, as told by your doctor.  Do not lift anything that is heavier than 10 lb (4.5 kg) while you have symptoms of sciatica. ? Avoid lifting heavy things even when you do not have symptoms. ? Avoid lifting heavy things over and over.  When you lift objects, always lift in a way that is safe for your body. To do this, you should: ? Bend your knees. ? Keep the object close to your body. ? Avoid twisting. General instructions  Stay at a healthy weight.  Wear comfortable shoes that support your feet. Avoid wearing high heels.  Avoid sleeping on a mattress that is too soft or too hard. You might have less pain if you sleep on a mattress that is firm enough to support your back.  Keep all follow-up visits as told by your doctor. This is important. Contact a doctor if:  You have pain that: ? Wakes you up when you are sleeping. ? Gets worse when you lie down. ? Is worse than the pain you have had in the past. ? Lasts longer than 4 weeks.  You lose weight without trying. Get help right away if:  You cannot control when you pee (urinate) or poop (have a bowel movement).  You have weakness in any of these areas and it gets worse: ? Lower back. ? The area between your hip bones. ? Butt. ? Legs.  You have redness or swelling of your back.  You have a burning feeling when you pee. Summary  Sciatica is pain, weakness, tingling, or loss of feeling (numbness) along the sciatic nerve.  This condition happens when the sciatic nerve is pinched or has pressure put on it.  Sciatica can cause pain, tingling, or loss of feeling (numbness) in the  lower back, legs, hips, and butt.  Treatment often includes rest, exercise, medicines, and putting ice or heat on the affected area. This information is not intended to replace advice given to you by your health care provider. Make sure you discuss any questions you have with your health care provider. Document Revised: 04/25/2018 Document Reviewed: 04/25/2018 Elsevier Patient Education  2020 [employer].

## 2019-11-15 NOTE — Progress Notes (Signed)
Established Patient Office Visit     This visit occurred during the SARS-CoV-2 public health emergency.  Safety protocols were in place, including screening questions prior to the visit, additional usage of staff PPE, and extensive cleaning of exam room while observing appropriate contact time as indicated for disinfecting solutions.    CC/Reason for Visit: Follow-up chronic medical conditions discussed left hip, back, thigh pain  HPI: Angelica Berg is a 44 y.o. female who is coming in today for the above mentioned reasons. Past Medical History is significant for: Type 2 diabetes diagnosed in April 2020, hyperlipidemia, transaminitis, morbid obesity, vitamin D deficiency.  She feels like she has been doing better in regards to type 2 diabetes but is concerned about continued weight gain and is interested in weight management referral today.  She is still not convinced about getting her Covid vaccines.  She would like to have her vitamin D levels rechecked today.  For the past couple weeks she has been having significant left lower back, hip, pain spreading down the back and side of her left thigh.  This is interfering with her gait and her work.   Past Medical/Surgical History: Past Medical History:  Diagnosis Date  . Diabetes mellitus ([place])   . Dyslipidemia     Past Surgical History:  Procedure Laterality Date  . CESAREAN SECTION    . KNEE ARTHROCENTESIS      Social History:  reports that she quit smoking about 17 months ago. She has never used smokeless tobacco. She reports previous alcohol use. She reports current drug use. Frequency: 7.00 times per week. Drug: Marijuana.  Allergies: No Known Allergies  Family History:  Family History  Problem Relation Age of Onset  . Diabetes Mother   . Hypertension Mother   . Cancer Maternal Grandmother        throat  . Heart disease Maternal Grandmother      Current Outpatient Medications:  .  atorvastatin (LIPITOR) 80 MG  tablet, TAKE ONE TABLET BY MOUTH DAILY, Disp: 90 tablet, Rfl: 0 .  Blood Glucose Monitoring Suppl (ACCU-CHEK GUIDE) w/Device KIT, 1 Device by Does not apply route daily. E11.9, Disp: 1 kit, Rfl: 0 .  Blood Glucose Monitoring Suppl (ONETOUCH VERIO) w/Device KIT, 1 Device by Does not apply route as directed., Disp: 1 kit, Rfl: 0 .  ergocalciferol (VITAMIN D2) 1.25 MG (50000 UT) capsule, Take 1 capsule (50,000 Units total) by mouth once a week., Disp: 12 capsule, Rfl: 0 .  glipiZIDE (GLUCOTROL) 5 MG tablet, TAKE ONE TABLET BY MOUTH TWICE A DAY BEFORE A MEAL, Disp: 180 tablet, Rfl: 1 .  glucose blood (ACCU-CHEK GUIDE) test strip, Use as instructed to test blood sugar 2 times daily E11.9, Disp: 100 each, Rfl: 12 .  metFORMIN (GLUCOPHAGE) 1000 MG tablet, TAKE ONE TABLET BY MOUTH TWICE A DAY WITH A MEAL, Disp: 60 tablet, Rfl: 0 .  cyclobenzaprine (FLEXERIL) 5 MG tablet, Take 1 tablet (5 mg total) by mouth 3 times/day as needed-between meals & bedtime for muscle spasms., Disp: 30 tablet, Rfl: 0  Review of Systems:  Constitutional: Denies fever, chills, diaphoresis, appetite change and fatigue.  HEENT: Denies photophobia, eye pain, redness, hearing loss, ear pain, congestion, sore throat, rhinorrhea, sneezing, mouth sores, trouble swallowing, neck pain, neck stiffness and tinnitus.   Respiratory: Denies SOB, DOE, cough, chest tightness,  and wheezing.   Cardiovascular: Denies chest pain, palpitations and leg swelling.  Gastrointestinal: Denies nausea, vomiting, abdominal pain, diarrhea, constipation, blood  in stool and abdominal distention.  Genitourinary: Denies dysuria, urgency, frequency, hematuria, flank pain and difficulty urinating.  Endocrine: Denies: hot or cold intolerance, sweats, changes in hair or nails, polyuria, polydipsia. Musculoskeletal: Denies  joint swelling, arthralgias. Skin: Denies pallor, rash and wound.  Neurological: Denies dizziness, seizures, syncope, weakness, light-headedness,  numbness and headaches.  Hematological: Denies adenopathy. Easy bruising, personal or family bleeding history  Psychiatric/Behavioral: Denies suicidal ideation, mood changes, confusion, nervousness, sleep disturbance and agitation    Physical Exam: Vitals:   11/15/19 0840  BP: 120/84  Pulse: 88  Temp: 98.7 F (37.1 C)  TempSrc: Oral  SpO2: 98%  Weight: (!) 254 lb (115.2 kg)    Body mass index is 37.51 kg/m.   Constitutional: NAD, calm, comfortable Eyes: PERRL, lids and conjunctivae normal, wears corrective lenses ENMT: Mucous membranes are moist.  Respiratory: clear to auscultation bilaterally, no wheezing, no crackles. Normal respiratory effort. No accessory muscle use.  Cardiovascular: Regular rate and rhythm, no murmurs / rubs / gallops. No extremity edema.  Neurologic: Grossly intact and nonfocal Psychiatric: Normal judgment and insight. Alert and oriented x 3. Normal mood.    Impression and Plan:  New onset type 2 diabetes mellitus ([place])  -Extremely well controlled with an A1c of 6.2 today, congratulated on this. We will continue to discuss lifestyle modifications. -Check microalbumin today. -She had an eye exam in July that was negative for diabetic retinopathy.  Hyperlipidemia associated with type 2 diabetes mellitus ([place]) -Last LDL was 188 April, she was started on atorvastatin then. -Recheck lipids when she returns fasting.  Morbid obesity ([place]) -Discussed healthy lifestyle, including increased physical activity and better food choices to promote weight loss. -Referral to medical weight management today.  Vitamin D deficiency  - Plan: VITAMIN D 25 Hydroxy (Vit-D Deficiency, Fractures)  Acute left-sided low back pain with left-sided sciatica  -The distribution of her pain corresponds to L5 radiculopathy/sciatica. -We have discussed icing, as needed NSAIDs, back stretches and eventually PT if no resolution. -We have decided to avoid steroids as she has just  gotten her diabetes under control. -I will give her a prescription for muscle relaxers; advised that these may cause drowsiness and to use at bedtime.   Patient Instructions  -Nice seeing you today!!  -Lab work today; will notify you once results are available.  -Referral to weight management clinic.  -for your back and hip: icing 2-3 times a day for 15 minutes, ibuprofen as needed and back stretches daily. Also try a muscle relaxer at bedtime. If no better in about 2 weeks, let me know n=and we can refer you to physical therapy. Stop walking for the next 2 weeks.  -Schedule follow up in 3 months.   Sciatica  Sciatica is pain, weakness, tingling, or loss of feeling (numbness) along the sciatic nerve. The sciatic nerve starts in the lower back and goes down the back of each leg. Sciatica usually goes away on its own or with treatment. Sometimes, sciatica may come back (recur). What are the causes? This condition happens when the sciatic nerve is pinched or has pressure put on it. This may be the result of:  A disk in between the bones of the spine bulging out too far (herniated disk).  Changes in the spinal disks that occur with aging.  A condition that affects a muscle in the butt.  Extra bone growth near the sciatic nerve.  A break (fracture) of the area between your hip bones (pelvis).  Pregnancy.  Tumor.  This is rare. What increases the risk? You are more likely to develop this condition if you:  Play sports that put pressure or stress on the spine.  Have poor strength and ease of movement (flexibility).  Have had a back injury in the past.  Have had back surgery.  Sit for long periods of time.  Do activities that involve bending or lifting over and over again.  Are very overweight (obese). What are the signs or symptoms? Symptoms can vary from mild to very bad. They may include:  Any of these problems in the lower back, leg, hip, or butt: ? Mild tingling,  loss of feeling, or dull aches. ? Burning sensations. ? Sharp pains.  Loss of feeling in the back of the calf or the sole of the foot.  Leg weakness.  Very bad back pain that makes it hard to move. These symptoms may get worse when you cough, sneeze, or laugh. They may also get worse when you sit or stand for long periods of time. How is this treated? This condition often gets better without any treatment. However, treatment may include:  Changing or cutting back on physical activity when you have pain.  Doing exercises and stretching.  Putting ice or heat on the affected area.  Medicines that help: ? To relieve pain and swelling. ? To relax your muscles.  Shots (injections) of medicines that help to relieve pain, irritation, and swelling.  Surgery. Follow these instructions at home: Medicines  Take over-the-counter and prescription medicines only as told by your doctor.  Ask your doctor if the medicine prescribed to you: ? Requires you to avoid driving or using heavy machinery. ? Can cause trouble pooping (constipation). You may need to take these steps to prevent or treat trouble pooping:  Drink enough fluids to keep your pee (urine) pale yellow.  Take over-the-counter or prescription medicines.  Eat foods that are high in fiber. These include beans, whole grains, and fresh fruits and vegetables.  Limit foods that are high in fat and sugar. These include fried or sweet foods. Managing pain      If told, put ice on the affected area. ? Put ice in a plastic bag. ? Place a towel between your skin and the bag. ? Leave the ice on for 20 minutes, 2-3 times a day.  If told, put heat on the affected area. Use the heat source that your doctor tells you to use, such as a moist heat pack or a heating pad. ? Place a towel between your skin and the heat source. ? Leave the heat on for 20-30 minutes. ? Remove the heat if your skin turns bright red. This is very important if  you are unable to feel pain, heat, or cold. You may have a greater risk of getting burned. Activity   Return to your normal activities as told by your doctor. Ask your doctor what activities are safe for you.  Avoid activities that make your symptoms worse.  Take short rests during the day. ? When you rest for a long time, do some physical activity or stretching between periods of rest. ? Avoid sitting for a long time without moving. Get up and move around at least one time each hour.  Exercise and stretch regularly, as told by your doctor.  Do not lift anything that is heavier than 10 lb (4.5 kg) while you have symptoms of sciatica. ? Avoid lifting heavy things even when you do not have symptoms. ?  Avoid lifting heavy things over and over.  When you lift objects, always lift in a way that is safe for your body. To do this, you should: ? Bend your knees. ? Keep the object close to your body. ? Avoid twisting. General instructions  Stay at a healthy weight.  Wear comfortable shoes that support your feet. Avoid wearing high heels.  Avoid sleeping on a mattress that is too soft or too hard. You might have less pain if you sleep on a mattress that is firm enough to support your back.  Keep all follow-up visits as told by your doctor. This is important. Contact a doctor if:  You have pain that: ? Wakes you up when you are sleeping. ? Gets worse when you lie down. ? Is worse than the pain you have had in the past. ? Lasts longer than 4 weeks.  You lose weight without trying. Get help right away if:  You cannot control when you pee (urinate) or poop (have a bowel movement).  You have weakness in any of these areas and it gets worse: ? Lower back. ? The area between your hip bones. ? Butt. ? Legs.  You have redness or swelling of your back.  You have a burning feeling when you pee. Summary  Sciatica is pain, weakness, tingling, or loss of feeling (numbness) along the  sciatic nerve.  This condition happens when the sciatic nerve is pinched or has pressure put on it.  Sciatica can cause pain, tingling, or loss of feeling (numbness) in the lower back, legs, hips, and butt.  Treatment often includes rest, exercise, medicines, and putting ice or heat on the affected area. This information is not intended to replace advice given to you by your health care provider. Make sure you discuss any questions you have with your health care provider. Document Revised: 04/25/2018 Document Reviewed: 04/25/2018 Elsevier Patient Education  2020 [place], [place] [facility]

## 2019-11-16 LAB — MICROALBUMIN / CREATININE URINE RATIO
Creatinine, Urine: 133 mg/dL (ref 20–275)
Microalb Creat Ratio: 53 mcg/mg creat — ABNORMAL HIGH (ref <30)
Microalb, Ur: 7 mg/dL

## 2019-11-17 ENCOUNTER — Other Ambulatory Visit: Payer: Self-pay | Admitting: Internal Medicine

## 2019-11-17 DIAGNOSIS — E559 Vitamin D deficiency, unspecified: Secondary | ICD-10-CM

## 2019-11-17 DIAGNOSIS — E119 Type 2 diabetes mellitus without complications: Secondary | ICD-10-CM

## 2019-11-17 MED ORDER — VITAMIN D (ERGOCALCIFEROL) 1.25 MG (50000 UNIT) PO CAPS: 50000.0000 [IU] | ORAL_CAPSULE | ORAL | 0 refills | Status: AC

## 2019-11-17 MED ORDER — LISINOPRIL 5 MG PO TABS: 5.0000 mg | ORAL_TABLET | Freq: Every day | ORAL | 1 refills | Status: DC

## 2019-11-20 ENCOUNTER — Encounter: Payer: PRIVATE HEALTH INSURANCE | Admitting: Obstetrics & Gynecology

## 2019-12-11 ENCOUNTER — Encounter: Payer: PRIVATE HEALTH INSURANCE | Admitting: Obstetrics & Gynecology

## 2019-12-19 ENCOUNTER — Telehealth: Payer: Self-pay | Admitting: *Deleted

## 2019-12-19 ENCOUNTER — Encounter: Payer: Self-pay | Admitting: Obstetrics & Gynecology

## 2019-12-19 ENCOUNTER — Ambulatory Visit (INDEPENDENT_AMBULATORY_CARE_PROVIDER_SITE_OTHER): Payer: Medicaid Other | Admitting: Obstetrics & Gynecology

## 2019-12-19 ENCOUNTER — Other Ambulatory Visit: Payer: Self-pay

## 2019-12-19 VITALS — BP 130/80 | Ht 69.5 in | Wt 254.0 lb

## 2019-12-19 DIAGNOSIS — Z01419 Encounter for gynecological examination (general) (routine) without abnormal findings: Secondary | ICD-10-CM

## 2019-12-19 DIAGNOSIS — Z6836 Body mass index (BMI) 36.0-36.9, adult: Secondary | ICD-10-CM | POA: Diagnosis not present

## 2019-12-19 DIAGNOSIS — N914 Secondary oligomenorrhea: Secondary | ICD-10-CM

## 2019-12-19 DIAGNOSIS — E119 Type 2 diabetes mellitus without complications: Secondary | ICD-10-CM

## 2019-12-19 NOTE — Progress Notes (Signed)
Angelica Berg December 30, 1975 [id number]   History:    44 y.o.  G2P1A1L2  Single.  Twin girls 42 yo.  RP:  Established patient presenting for annual gyn exam   HPI: Oligomenorrhea.  LMP 11/2019 normal flow.  No pelvic pain.  Abstinent x >2 years.  Urine/BMs normal.  Breasts normal, no recent recurrence of Breast abscess.  BMI 36.97.  Walking regularly. Would [facility].  Health labs with Fam MD.  DM type 2.  Microalbumin/Creat ratio increased at 53 on 11/15/2019.  Feels overwhelmed.  No major depressive Sxs.  Past medical history,surgical history, family history and social history were all reviewed and documented in the EPIC chart.  Gynecologic History No LMP recorded. (Menstrual status: Perimenopausal).  Obstetric History OB History  Gravida Para Term Preterm AB Living  2 1     1 1   SAB TAB Ectopic Multiple Live Births  1            # Outcome Date GA Lbr Len/2nd Weight Sex Delivery Anes PTL Lv  2 SAB           1 Para              ROS: A ROS was performed and pertinent positives and negatives are included in the history.  GENERAL: No fevers or chills. HEENT: No change in vision, no earache, sore throat or sinus congestion. NECK: No pain or stiffness. CARDIOVASCULAR: No chest pain or pressure. No palpitations. PULMONARY: No shortness of breath, cough or wheeze. GASTROINTESTINAL: No abdominal pain, nausea, vomiting or diarrhea, melena or bright red blood per rectum. GENITOURINARY: No urinary frequency, urgency, hesitancy or dysuria. MUSCULOSKELETAL: No joint or muscle pain, no back pain, no recent trauma. DERMATOLOGIC: No rash, no itching, no lesions. ENDOCRINE: No polyuria, polydipsia, no heat or cold intolerance. No recent change in weight. HEMATOLOGICAL: No anemia or easy bruising or bleeding. NEUROLOGIC: No headache, seizures, numbness, tingling or weakness. PSYCHIATRIC: No depression, no loss of interest in normal activity or change in sleep pattern.       Exam:   BP 130/80   Ht 5' 9.5" (1.765 m)   Wt 254 lb (115.2 kg)   BMI 36.97 kg/m   Body mass index is 36.97 kg/m.  General appearance : Well developed well nourished female. No acute distress HEENT: Eyes: no retinal hemorrhage or exudates,  Neck supple, trachea midline, no carotid bruits, no thyroidmegaly Lungs: Clear to auscultation, no rhonchi or wheezes, or rib retractions  Heart: Regular rate and rhythm, no murmurs or gallops Breast:Examined in sitting and supine position were symmetrical in appearance, no palpable masses or tenderness,  no skin retraction, no nipple inversion, no nipple discharge, no skin discoloration, no axillary or supraclavicular lymphadenopathy Abdomen: no palpable masses or tenderness, no rebound or guarding Extremities: no edema or skin discoloration or tenderness  Pelvic: Vulva: Normal             Vagina: No gross lesions or discharge  Cervix: No gross lesions or discharge  Uterus  AV, normal size, shape and consistency, non-tender and mobile  Adnexa  Without masses or tenderness  Anus: Normal   Assessment/Plan:  43 y.o. female for annual exam   1. Well female exam with routine gynecological exam Normal gynecologic exam.  Pap test April 2020 was negative with negative high-risk HPV and patient is abstinent for more than 2 years, therefore no indication to repeat a Pap test this year.  Breast exam  normal.  Will schedule a repeat screening mammogram now.  Health labs with family physician.  Follow-up on kidney function with family physician as patient's microalbumin over creatinine ratio was elevated at 53 July 2021.  Recommend low protein intake with plenty of hydration.  If no improvement at follow-up with her family physician, recommend a referral to a nephrologist.  Patient is followed for diabetes mellitus type 2 and hypercholesterolemia as well.  2. Secondary oligomenorrhea Longstanding secondary oligomenorrhea.  Last menstrual period August  2021 was normal in flow.  Will observe at this time, if develops heavy flow or prolonged bleeding, will control with a progestin treatment.  3. Class 2 severe obesity due to excess calories with serious comorbidity and body mass index (BMI) of 36.0 to 36.9 in adult [facility]) Decision to refer to [facility] for weight loss and nutritional advice in the context of a recent diagnosis of abnormal renal function, diabetes mellitus type 2 and hypercholesterolemia.  Encouraged to continue with fitness activities.  [name] MD, 8:30 AM 12/19/2019

## 2019-12-19 NOTE — Telephone Encounter (Signed)
-----   Message from [name], MD sent at 12/19/2019  8:54 AM EDT ----- Regarding: Refer to [facility] Weight loss with DM type 2 and recent decreased renal function under investigation.

## 2019-12-19 NOTE — Telephone Encounter (Signed)
Referral placed in epic at [facility] they will call to schedule. 

## 2019-12-20 DIAGNOSIS — H5213 Myopia, bilateral: Secondary | ICD-10-CM | POA: Diagnosis not present

## 2019-12-20 NOTE — Telephone Encounter (Signed)
Patient scheduled on 01/29/20

## 2020-01-01 ENCOUNTER — Ambulatory Visit: Payer: Medicaid Other | Admitting: Dietician

## 2020-01-23 ENCOUNTER — Ambulatory Visit (INDEPENDENT_AMBULATORY_CARE_PROVIDER_SITE_OTHER): Payer: Medicaid Other | Admitting: Internal Medicine

## 2020-01-23 ENCOUNTER — Encounter: Payer: Self-pay | Admitting: Internal Medicine

## 2020-01-23 ENCOUNTER — Other Ambulatory Visit: Payer: Self-pay

## 2020-01-23 VITALS — BP 150/100 | HR 93 | Temp 98.5°F | Wt 255.7 lb

## 2020-01-23 DIAGNOSIS — R030 Elevated blood-pressure reading, without diagnosis of hypertension: Secondary | ICD-10-CM | POA: Diagnosis not present

## 2020-01-23 DIAGNOSIS — E119 Type 2 diabetes mellitus without complications: Secondary | ICD-10-CM | POA: Diagnosis not present

## 2020-01-23 DIAGNOSIS — E785 Hyperlipidemia, unspecified: Secondary | ICD-10-CM | POA: Diagnosis not present

## 2020-01-23 DIAGNOSIS — R7401 Elevation of levels of liver transaminase levels: Secondary | ICD-10-CM | POA: Diagnosis not present

## 2020-01-23 DIAGNOSIS — E1129 Type 2 diabetes mellitus with other diabetic kidney complication: Secondary | ICD-10-CM | POA: Diagnosis not present

## 2020-01-23 DIAGNOSIS — R809 Proteinuria, unspecified: Secondary | ICD-10-CM | POA: Diagnosis not present

## 2020-01-23 DIAGNOSIS — E559 Vitamin D deficiency, unspecified: Secondary | ICD-10-CM | POA: Diagnosis not present

## 2020-01-23 DIAGNOSIS — E1169 Type 2 diabetes mellitus with other specified complication: Secondary | ICD-10-CM | POA: Diagnosis not present

## 2020-01-23 MED ORDER — METFORMIN HCL 1000 MG PO TABS: 1000.0000 mg | ORAL_TABLET | Freq: Two times a day (BID) | ORAL | 0 refills | Status: DC

## 2020-01-23 MED ORDER — GLIPIZIDE 5 MG PO TABS: ORAL_TABLET | ORAL | 1 refills | Status: DC

## 2020-01-23 MED ORDER — LISINOPRIL 5 MG PO TABS: 5.0000 mg | ORAL_TABLET | Freq: Every day | ORAL | 1 refills | Status: DC

## 2020-01-23 NOTE — Patient Instructions (Signed)
-  Nice seeing you today!!  -Go back on your metformin, glipizide, lisinopril and vit D.  -Schedule follow up in 3 months.

## 2020-01-23 NOTE — Progress Notes (Signed)
Established Patient Office Visit     This visit occurred during the SARS-CoV-2 public health emergency.  Safety protocols were in place, including screening questions prior to the visit, additional usage of staff PPE, and extensive cleaning of exam room while observing appropriate contact time as indicated for disinfecting solutions.    CC/Reason for Visit: 60-monthfollow-up chronic medical conditions  HPI: Angelica BAKULAis a 44y.o. female who is coming in today for the above mentioned reasons. Past Medical History is significant for: Type 2 diabetes, hyperlipidemia, transaminitis due to fatty liver, morbid obesity and vitamin D deficiency.  She has not been diagnosed with hypertension.  She was recently started on low-dose lisinopril due to microalbuminuria.  She decided to stop taking all her medications including Metformin, glipizide, vitamin D because she thought these were damaging her kidneys.  Ever since then she has not been feeling well, a little dizzy at times.  She thinks she might be going through menopause, she has a GYN.   Past Medical/Surgical History: Past Medical History:  Diagnosis Date  . Diabetes mellitus ([place]   . Dyslipidemia     Past Surgical History:  Procedure Laterality Date  . CESAREAN SECTION    . KNEE ARTHROCENTESIS      Social History:  reports that she quit smoking about 19 months ago. She has never used smokeless tobacco. She reports previous alcohol use. She reports current drug use. Frequency: 7.00 times per week. Drug: Marijuana.  Allergies: No Known Allergies  Family History:  Family History  Problem Relation Age of Onset  . Diabetes Mother   . Hypertension Mother   . Cancer Maternal Grandmother        throat  . Heart disease Maternal Grandmother      Current Outpatient Medications:  .  atorvastatin (LIPITOR) 80 MG tablet, TAKE ONE TABLET BY MOUTH DAILY, Disp: 90 tablet, Rfl: 0 .  Blood Glucose Monitoring Suppl (ACCU-CHEK  GUIDE) w/Device KIT, 1 Device by Does not apply route daily. E11.9, Disp: 1 kit, Rfl: 0 .  Blood Glucose Monitoring Suppl (ONETOUCH VERIO) w/Device KIT, 1 Device by Does not apply route as directed., Disp: 1 kit, Rfl: 0 .  cyclobenzaprine (FLEXERIL) 5 MG tablet, Take 1 tablet (5 mg total) by mouth 3 times/day as needed-between meals & bedtime for muscle spasms., Disp: 30 tablet, Rfl: 0 .  ergocalciferol (VITAMIN D2) 1.25 MG (50000 UT) capsule, Take 1 capsule (50,000 Units total) by mouth once a week., Disp: 12 capsule, Rfl: 0 .  glipiZIDE (GLUCOTROL) 5 MG tablet, TAKE ONE TABLET BY MOUTH TWICE A DAY BEFORE A MEAL, Disp: 180 tablet, Rfl: 1 .  glucose blood (ACCU-CHEK GUIDE) test strip, Use as instructed to test blood sugar 2 times daily E11.9, Disp: 100 each, Rfl: 12 .  lisinopril (ZESTRIL) 5 MG tablet, Take 1 tablet (5 mg total) by mouth daily., Disp: 90 tablet, Rfl: 1 .  metFORMIN (GLUCOPHAGE) 1000 MG tablet, Take 1 tablet (1,000 mg total) by mouth 2 (two) times daily with a meal., Disp: 60 tablet, Rfl: 0 .  Vitamin D, Ergocalciferol, (DRISDOL) 1.25 MG (50000 UNIT) CAPS capsule, Take 1 capsule (50,000 Units total) by mouth every 7 (seven) days for 12 doses., Disp: 12 capsule, Rfl: 0  Review of Systems:  Constitutional: Denies fever, chills, diaphoresis, appetite change and fatigue.  HEENT: Denies photophobia, eye pain, redness, hearing loss, ear pain, congestion, sore throat, rhinorrhea, sneezing, mouth sores, trouble swallowing, neck pain, neck stiffness and  tinnitus.   Respiratory: Denies SOB, DOE, cough, chest tightness,  and wheezing.   Cardiovascular: Denies chest pain, palpitations and leg swelling.  Gastrointestinal: Denies nausea, vomiting, abdominal pain, diarrhea, constipation, blood in stool and abdominal distention.  Genitourinary: Denies dysuria, urgency, frequency, hematuria, flank pain and difficulty urinating.  Endocrine: Denies: hot or cold intolerance, sweats, changes in hair or  nails, polyuria, polydipsia. Musculoskeletal: Denies myalgias, back pain, joint swelling, arthralgias and gait problem.  Skin: Denies pallor, rash and wound.  Neurological: Denies seizures, syncope, weakness,  numbness and headaches.  Hematological: Denies adenopathy. Easy bruising, personal or family bleeding history  Psychiatric/Behavioral: Denies suicidal ideation, mood changes, confusion, nervousness, sleep disturbance and agitation    Physical Exam: Vitals:   01/23/20 0904  BP: (!) 150/100  Pulse: 93  Temp: 98.5 F (36.9 C)  TempSrc: Oral  SpO2: 97%  Weight: 255 lb 11.2 oz (116 kg)    Body mass index is 37.22 kg/m.   Constitutional: NAD, calm, comfortable Eyes: PERRL, lids and conjunctivae normal, wears corrective lenses ENMT: Mucous membranes are moist.  Respiratory: clear to auscultation bilaterally, no wheezing, no crackles. Normal respiratory effort. No accessory muscle use.  Cardiovascular: Regular rate and rhythm, no murmurs / rubs / gallops. No extremity edema. Neurologic: Grossly intact and nonfocal Psychiatric: Normal judgment and insight. Alert and oriented x 3. Normal mood.    Impression and Plan:  Type 2 diabetes mellitus with microalbuminuria, without long-term current use of insulin (HCC) -Last A1c was well controlled at 6.2 in July. -She will resume Metformin and glipizide and return in 3 months for follow-up.  Vitamin D deficiency -She will resume taking vitamin D and return in 3 months for follow-up.  Morbid obesity ([place]) -Discussed healthy lifestyle, including increased physical activity and better food choices to promote weight loss. -Follow-up at the weight and wellness clinic has been arranged.  Hyperlipidemia associated with type 2 diabetes mellitus ([place]) -Last LDL was 188 in April, she is on atorvastatin 80 mg daily.  Transaminitis -Presumed due to fatty liver, lifestyle modifications are in order.  Elevated BP without diagnosis of  hypertension -She will do ambulatory monitoring and return in 3 months for follow-up.    Patient Instructions  -Nice seeing you today!!  -Go back on your metformin, glipizide, lisinopril and vit D.  -Schedule follow up in 3 months.     Lelon Frohlich, MD [facility]

## 2020-01-29 ENCOUNTER — Ambulatory Visit: Payer: Medicaid Other | Admitting: Skilled Nursing Facility1

## 2020-02-15 ENCOUNTER — Other Ambulatory Visit: Payer: Self-pay

## 2020-02-15 ENCOUNTER — Ambulatory Visit (INDEPENDENT_AMBULATORY_CARE_PROVIDER_SITE_OTHER): Payer: Medicaid Other | Admitting: Internal Medicine

## 2020-02-15 ENCOUNTER — Encounter: Payer: Self-pay | Admitting: Internal Medicine

## 2020-02-15 VITALS — BP 110/80 | HR 70 | Temp 99.1°F | Wt 258.6 lb

## 2020-02-15 DIAGNOSIS — E785 Hyperlipidemia, unspecified: Secondary | ICD-10-CM

## 2020-02-15 DIAGNOSIS — E1169 Type 2 diabetes mellitus with other specified complication: Secondary | ICD-10-CM | POA: Diagnosis not present

## 2020-02-15 DIAGNOSIS — R809 Proteinuria, unspecified: Secondary | ICD-10-CM | POA: Diagnosis not present

## 2020-02-15 DIAGNOSIS — E1129 Type 2 diabetes mellitus with other diabetic kidney complication: Secondary | ICD-10-CM | POA: Diagnosis not present

## 2020-02-15 DIAGNOSIS — R7401 Elevation of levels of liver transaminase levels: Secondary | ICD-10-CM

## 2020-02-15 DIAGNOSIS — E559 Vitamin D deficiency, unspecified: Secondary | ICD-10-CM

## 2020-02-15 DIAGNOSIS — E119 Type 2 diabetes mellitus without complications: Secondary | ICD-10-CM

## 2020-02-15 LAB — POCT GLYCOSYLATED HEMOGLOBIN (HGB A1C): Hemoglobin A1C: 7.9 % — AB (ref 4.0–5.6)

## 2020-02-15 LAB — BASIC METABOLIC PANEL
BUN: 9 mg/dL (ref 7–25)
CO2: 28 mmol/L (ref 20–32)
Calcium: 9.2 mg/dL (ref 8.6–10.2)
Chloride: 104 mmol/L (ref 98–110)
Creat: 0.66 mg/dL (ref 0.50–1.10)
Glucose, Bld: 149 mg/dL — ABNORMAL HIGH (ref 65–99)
Potassium: 4.2 mmol/L (ref 3.5–5.3)
Sodium: 139 mmol/L (ref 135–146)

## 2020-02-15 MED ORDER — METFORMIN HCL 1000 MG PO TABS: 1000.0000 mg | ORAL_TABLET | Freq: Two times a day (BID) | ORAL | 0 refills | Status: DC

## 2020-02-15 NOTE — Progress Notes (Signed)
Established Patient Office Visit     This visit occurred during the SARS-CoV-2 public health emergency.  Safety protocols were in place, including screening questions prior to the visit, additional usage of staff PPE, and extensive cleaning of exam room while observing appropriate contact time as indicated for disinfecting solutions.    CC/Reason for Visit: Follow-up chronic medical conditions  HPI: Angelica Berg is a 44 y.o. female who is coming in today for the above mentioned reasons. 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  At last visit beginning of the month, she had stopped taking all of her medications.  She has now been taking them routinely.  She is due to have renal function and electrolytes checked as she was recently started on lisinopril for her microalbuminuria.  Currently she has been feeling well.  She is declining flu vaccination today.   Past Medical/Surgical History: Past Medical History:  Diagnosis Date  . Diabetes mellitus ([place])   . Dyslipidemia     Past Surgical History:  Procedure Laterality Date  . CESAREAN SECTION    . KNEE ARTHROCENTESIS      Social History:  reports that she quit smoking about 20 months ago. She has never used smokeless tobacco. She reports previous alcohol use. She reports current drug use. Frequency: 7.00 times per week. Drug: Marijuana.  Allergies: No Known Allergies  Family History:  Family History  Problem Relation Age of Onset  . Diabetes Mother   . Hypertension Mother   . Cancer Maternal Grandmother        throat  . Heart disease Maternal Grandmother      Current Outpatient Medications:  .  atorvastatin (LIPITOR) 80 MG tablet, TAKE ONE TABLET BY MOUTH DAILY, Disp: 90 tablet, Rfl: 0 .  Blood Glucose Monitoring Suppl (ACCU-CHEK GUIDE) w/Device KIT, 1 Device by Does  not apply route daily. E11.9, Disp: 1 kit, Rfl: 0 .  Blood Glucose Monitoring Suppl (ONETOUCH VERIO) w/Device KIT, 1 Device by Does not apply route as directed., Disp: 1 kit, Rfl: 0 .  cyclobenzaprine (FLEXERIL) 5 MG tablet, Take 1 tablet (5 mg total) by mouth 3 times/day as needed-between meals & bedtime for muscle spasms., Disp: 30 tablet, Rfl: 0 .  ergocalciferol (VITAMIN D2) 1.25 MG (50000 UT) capsule, Take 1 capsule (50,000 Units total) by mouth once a week., Disp: 12 capsule, Rfl: 0 .  glipiZIDE (GLUCOTROL) 5 MG tablet, TAKE ONE TABLET BY MOUTH TWICE A DAY BEFORE A MEAL, Disp: 180 tablet, Rfl: 1 .  glucose blood (ACCU-CHEK GUIDE) test strip, Use as instructed to test blood sugar 2 times daily E11.9, Disp: 100 each, Rfl: 12 .  lisinopril (ZESTRIL) 5 MG tablet, Take 1 tablet (5 mg total) by mouth daily., Disp: 90 tablet, Rfl: 1 .  metFORMIN (GLUCOPHAGE) 1000 MG tablet, Take 1 tablet (1,000 mg total) by mouth 2 (two) times daily with a meal., Disp: 60 tablet, Rfl: 0  Review of Systems:  Constitutional: Denies fever, chills, diaphoresis, appetite change and fatigue.  HEENT: Denies photophobia, eye pain, redness, hearing loss, ear pain, congestion, sore throat, rhinorrhea, sneezing, mouth sores, trouble swallowing, neck pain, neck stiffness and tinnitus.   Respiratory: Denies SOB, DOE, cough, chest tightness,  and wheezing.   Cardiovascular: Denies chest pain, palpitations and leg swelling.  Gastrointestinal: Denies nausea, vomiting, abdominal pain, diarrhea, constipation, blood in stool and abdominal distention.  Genitourinary: Denies  dysuria, urgency, frequency, hematuria, flank pain and difficulty urinating.  Endocrine: Denies: hot or cold intolerance, sweats, changes in hair or nails, polyuria, polydipsia. Musculoskeletal: Denies myalgias, back pain, joint swelling, arthralgias and gait problem.  Skin: Denies pallor, rash and wound.  Neurological: Denies dizziness, seizures, syncope,  weakness, light-headedness, numbness and headaches.  Hematological: Denies adenopathy. Easy bruising, personal or family bleeding history  Psychiatric/Behavioral: Denies suicidal ideation, mood changes, confusion, nervousness, sleep disturbance and agitation    Physical Exam: Vitals:   02/15/20 0917  BP: 110/80  Pulse: 70  Temp: 99.1 F (37.3 C)  TempSrc: Oral  SpO2: 97%  Weight: 258 lb 9.6 oz (117.3 kg)    Body mass index is 37.64 kg/m.   Constitutional: NAD, calm, comfortable Eyes: PERRL, lids and conjunctivae normal, wears corrective lenses ENMT: Mucous membranes are moist.  Respiratory: clear to auscultation bilaterally, no wheezing, no crackles. Normal respiratory effort. No accessory muscle use.  Cardiovascular: Regular rate and rhythm, no murmurs / rubs / gallops. No extremity edema.   Neurologic: Grossly intact and nonfocal. Psychiatric: Normal judgment and insight. Alert and oriented x 3. Normal mood.    Impression and Plan:  Type 2 diabetes mellitus with microalbuminuria, without long-term current use of insulin ([facility])  -A1c is 7.9 today. -Since she just resumed medications 3 weeks ago, no changes today. -Return in 3 months for follow-up.  Morbid obesity ([place]) -Discussed healthy lifestyle, including increased physical activity and better food choices to promote weight loss.  Vitamin D deficiency -Recheck levels when she completes weekly high-dose supplementation.  Hyperlipidemia associated with type 2 diabetes mellitus ([place]) -Last LDL was 188 in April, she is on high-dose atorvastatin 80 mg which she just resumed taking 1 month ago.  Transaminitis -Due to [place]. -She continues to work on lifestyle modifications.    Patient Instructions  -Nice seeing you today!!  -Lab work today; will notify you once results are available.  -Schedule follow up in 3 months.     Lelon Frohlich, MD [facility]

## 2020-02-15 NOTE — Patient Instructions (Signed)
-  Nice seeing you today!!  -Lab work today; will notify you once results are available.  -Schedule follow up in 3 months. 

## 2020-03-20 ENCOUNTER — Other Ambulatory Visit: Payer: Self-pay | Admitting: Internal Medicine

## 2020-03-20 DIAGNOSIS — E1129 Type 2 diabetes mellitus with other diabetic kidney complication: Secondary | ICD-10-CM

## 2020-03-20 DIAGNOSIS — R809 Proteinuria, unspecified: Secondary | ICD-10-CM

## 2020-03-20 DIAGNOSIS — E785 Hyperlipidemia, unspecified: Secondary | ICD-10-CM

## 2020-03-20 DIAGNOSIS — E1169 Type 2 diabetes mellitus with other specified complication: Secondary | ICD-10-CM

## 2020-04-23 ENCOUNTER — Ambulatory Visit: Payer: Medicaid Other | Admitting: Internal Medicine

## 2020-05-22 ENCOUNTER — Other Ambulatory Visit: Payer: Self-pay

## 2020-05-22 ENCOUNTER — Ambulatory Visit (INDEPENDENT_AMBULATORY_CARE_PROVIDER_SITE_OTHER): Payer: Medicaid Other | Admitting: Internal Medicine

## 2020-05-22 ENCOUNTER — Encounter: Payer: Self-pay | Admitting: Internal Medicine

## 2020-05-22 VITALS — BP 110/80 | HR 92 | Temp 98.2°F | Wt 261.0 lb

## 2020-05-22 DIAGNOSIS — E1129 Type 2 diabetes mellitus with other diabetic kidney complication: Secondary | ICD-10-CM

## 2020-05-22 DIAGNOSIS — R7401 Elevation of levels of liver transaminase levels: Secondary | ICD-10-CM

## 2020-05-22 DIAGNOSIS — E785 Hyperlipidemia, unspecified: Secondary | ICD-10-CM

## 2020-05-22 DIAGNOSIS — R809 Proteinuria, unspecified: Secondary | ICD-10-CM

## 2020-05-22 DIAGNOSIS — E559 Vitamin D deficiency, unspecified: Secondary | ICD-10-CM | POA: Diagnosis not present

## 2020-05-22 DIAGNOSIS — E1169 Type 2 diabetes mellitus with other specified complication: Secondary | ICD-10-CM | POA: Diagnosis not present

## 2020-05-22 LAB — POCT GLYCOSYLATED HEMOGLOBIN (HGB A1C): Hemoglobin A1C: 7.7 % — AB (ref 4.0–5.6)

## 2020-05-22 NOTE — Patient Instructions (Signed)
-  Nice seeing you today!!  -Schedule follow up in 3 months for your physical.  -Make sure you are taking all your medications.

## 2020-05-22 NOTE — Progress Notes (Signed)
Established Patient Office Visit     This visit occurred during the SARS-CoV-2 public health emergency.  Safety protocols were in place, including screening questions prior to the visit, additional usage of staff PPE, and extensive cleaning of exam room while observing appropriate contact time as indicated for disinfecting solutions.    CC/Reason for Visit: 50-monthfollow-up chronic medical conditions  HPI: Angelica AWADis a 45y.o. female who is coming in today for the above mentioned reasons. 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  She is here today really for diabetic follow-up.  She tells me she has not done well, she does not been following a healthy lifestyle.  She has only been taking Metformin in the evening, has not taking her morning dose or her glipizide.  Other than this no acute complaints, she continues to decline flu and COVID immunizations.   Past Medical/Surgical History: Past Medical History:  Diagnosis Date  . [place]   . Dyslipidemia     Past Surgical History:  Procedure Laterality Date  . CESAREAN SECTION    . KNEE ARTHROCENTESIS      Social History:  reports that she quit smoking about 1 years ago. She has never used smokeless tobacco. She reports previous alcohol use. She reports current drug use. Frequency: 7.00 times per week. Drug: Marijuana.  Allergies: No Known Allergies  Family History:  Family History  Problem Relation Age of Onset  . Diabetes Mother   . Hypertension Mother   . Cancer Maternal Grandmother        throat  . Heart disease Maternal Grandmother      Current Outpatient Medications:  .  atorvastatin (LIPITOR) 80 MG tablet, TAKE ONE TABLET BY MOUTH DAILY, Disp: 90 tablet, Rfl: 1 .  Blood Glucose Monitoring Suppl (ACCU-CHEK GUIDE) w/Device KIT, 1 Device by Does  not apply route daily. E11.9, Disp: 1 kit, Rfl: 0 .  Blood Glucose Monitoring Suppl (ONETOUCH VERIO) w/Device KIT, 1 Device by Does not apply route as directed., Disp: 1 kit, Rfl: 0 .  cyclobenzaprine (FLEXERIL) 5 MG tablet, Take 1 tablet (5 mg total) by mouth 3 times/day as needed-between meals & bedtime for muscle spasms., Disp: 30 tablet, Rfl: 0 .  ergocalciferol (VITAMIN D2) 1.25 MG (50000 UT) capsule, Take 1 capsule (50,000 Units total) by mouth once a week., Disp: 12 capsule, Rfl: 0 .  glipiZIDE (GLUCOTROL) 5 MG tablet, TAKE ONE TABLET BY MOUTH TWICE A DAY BEFORE A MEAL, Disp: 180 tablet, Rfl: 1 .  glucose blood (ACCU-CHEK GUIDE) test strip, Use as instructed to test blood sugar 2 times daily E11.9, Disp: 100 each, Rfl: 12 .  lisinopril (ZESTRIL) 5 MG tablet, Take 1 tablet (5 mg total) by mouth daily., Disp: 90 tablet, Rfl: 1 .  metFORMIN (GLUCOPHAGE) 1000 MG tablet, TAKE ONE TABLET BY MOUTH TWICE A DAY WITH A MEAL, Disp: 180 tablet, Rfl: 1  Review of Systems:  Constitutional: Denies fever, chills, diaphoresis, appetite change and fatigue.  HEENT: Denies photophobia, eye pain, redness, hearing loss, ear pain, congestion, sore throat, rhinorrhea, sneezing, mouth sores, trouble swallowing, neck pain, neck stiffness and tinnitus.   Respiratory: Denies SOB, DOE, cough, chest tightness,  and wheezing.   Cardiovascular: Denies chest pain, palpitations and leg swelling.  Gastrointestinal: Denies nausea, vomiting, abdominal pain, diarrhea, constipation, blood in stool and abdominal distention.  Genitourinary: Denies dysuria, urgency,  frequency, hematuria, flank pain and difficulty urinating.  Endocrine: Denies: hot or cold intolerance, sweats, changes in hair or nails, polyuria, polydipsia. Musculoskeletal: Denies myalgias, back pain, joint swelling, arthralgias and gait problem.  Skin: Denies pallor, rash and wound.  Neurological: Denies dizziness, seizures, syncope, weakness, light-headedness,  numbness and headaches.  Hematological: Denies adenopathy. Easy bruising, personal or family bleeding history  Psychiatric/Behavioral: Denies suicidal ideation, mood changes, confusion, nervousness, sleep disturbance and agitation    Physical Exam: Vitals:   05/22/20 0908  BP: 110/80  Pulse: 92  Temp: 98.2 F (36.8 C)  TempSrc: Oral  SpO2: 96%  Weight: 261 lb (118.4 kg)    Body mass index is 37.99 kg/m.   Constitutional: NAD, calm, comfortable Eyes: PERRL, lids and conjunctivae normal, wears corrective lenses ENMT: Mucous membranes are moist. Respiratory: clear to auscultation bilaterally, no wheezing, no crackles. Normal respiratory effort. No accessory muscle use.  Cardiovascular: Regular rate and rhythm, no murmurs / rubs / gallops. No extremity edema.  Neurologic: Grossly intact and nonfocal. Psychiatric: Normal judgment and insight. Alert and oriented x 3. Normal mood.    Impression and Plan:  Type 2 diabetes mellitus with microalbuminuria, without long-term current use of insulin ([place])  -As suspected, A1c remains above goal today at 7.7. -We have discussed healthy lifestyle at length, as well as necessity of taking medications. -We talked about possibility of Trulicity/Ozempic as a weekly injectable in addition to Metformin to alleviate pill burden, however she would rather try to take the glipizide in addition to the Metformin for now.  Hyperlipidemia associated with type 2 diabetes mellitus ([place]) -Last LDL was not at goal at 188 in April 2021, she was started on atorvastatin 80 mg at that time, recheck lipids when she returns for CPE in 3 months.  Morbid obesity ([place]) -Discussed healthy lifestyle, including increased physical activity and better food choices to promote weight loss.  Vitamin D deficiency -Recheck levels when she returns for CPE.  Transaminitis -Due to [facility], recheck when she returns for physical.    Patient Instructions  -Nice seeing you  today!!  -Schedule follow up in 3 months for your physical.  -Make sure you are taking all your medications.     Lelon Frohlich, MD [facility]

## 2020-08-21 ENCOUNTER — Encounter: Payer: Self-pay | Admitting: Internal Medicine

## 2020-08-21 ENCOUNTER — Other Ambulatory Visit: Payer: Self-pay

## 2020-08-21 ENCOUNTER — Other Ambulatory Visit: Payer: Self-pay | Admitting: Internal Medicine

## 2020-08-21 ENCOUNTER — Ambulatory Visit (INDEPENDENT_AMBULATORY_CARE_PROVIDER_SITE_OTHER): Payer: Medicaid Other | Admitting: Internal Medicine

## 2020-08-21 VITALS — BP 130/80 | Temp 98.0°F | Ht 70.0 in | Wt 268.7 lb

## 2020-08-21 DIAGNOSIS — Z0000 Encounter for general adult medical examination without abnormal findings: Secondary | ICD-10-CM

## 2020-08-21 DIAGNOSIS — Z1239 Encounter for other screening for malignant neoplasm of breast: Secondary | ICD-10-CM

## 2020-08-21 DIAGNOSIS — E1129 Type 2 diabetes mellitus with other diabetic kidney complication: Secondary | ICD-10-CM | POA: Diagnosis not present

## 2020-08-21 DIAGNOSIS — E559 Vitamin D deficiency, unspecified: Secondary | ICD-10-CM | POA: Diagnosis not present

## 2020-08-21 DIAGNOSIS — E1169 Type 2 diabetes mellitus with other specified complication: Secondary | ICD-10-CM

## 2020-08-21 DIAGNOSIS — R7401 Elevation of levels of liver transaminase levels: Secondary | ICD-10-CM | POA: Diagnosis not present

## 2020-08-21 DIAGNOSIS — E1121 Type 2 diabetes mellitus with diabetic nephropathy: Secondary | ICD-10-CM | POA: Diagnosis not present

## 2020-08-21 DIAGNOSIS — G5601 Carpal tunnel syndrome, right upper limb: Secondary | ICD-10-CM

## 2020-08-21 DIAGNOSIS — E785 Hyperlipidemia, unspecified: Secondary | ICD-10-CM | POA: Diagnosis not present

## 2020-08-21 DIAGNOSIS — R809 Proteinuria, unspecified: Secondary | ICD-10-CM | POA: Diagnosis not present

## 2020-08-21 LAB — COMPREHENSIVE METABOLIC PANEL
ALT: 18 U/L (ref 0–35)
AST: 15 U/L (ref 0–37)
Albumin: 4 g/dL (ref 3.5–5.2)
Alkaline Phosphatase: 50 U/L (ref 39–117)
BUN: 7 mg/dL (ref 6–23)
CO2: 28 mEq/L (ref 19–32)
Calcium: 8.7 mg/dL (ref 8.4–10.5)
Chloride: 105 mEq/L (ref 96–112)
Creatinine, Ser: 0.6 mg/dL (ref 0.40–1.20)
GFR: 110.17 mL/min (ref >60.00)
Glucose, Bld: 159 mg/dL — ABNORMAL HIGH (ref 70–99)
Potassium: 4.3 mEq/L (ref 3.5–5.1)
Sodium: 140 mEq/L (ref 135–145)
Total Bilirubin: 1.4 mg/dL — ABNORMAL HIGH (ref 0.2–1.2)
Total Protein: 6.6 g/dL (ref 6.0–8.3)

## 2020-08-21 LAB — CBC WITH DIFFERENTIAL/PLATELET
Basophils Absolute: 0 10*3/uL (ref 0.0–0.1)
Basophils Relative: 0.2 % (ref 0.0–3.0)
Eosinophils Absolute: 0.2 10*3/uL (ref 0.0–0.7)
Eosinophils Relative: 2.9 % (ref 0.0–5.0)
HCT: 38.3 % (ref 36.0–46.0)
Hemoglobin: 13 g/dL (ref 12.0–15.0)
Lymphocytes Relative: 50 % — ABNORMAL HIGH (ref 12.0–46.0)
Lymphs Abs: 3.3 10*3/uL (ref 0.7–4.0)
MCHC: 34 g/dL (ref 30.0–36.0)
MCV: 92.9 fl (ref 78.0–100.0)
Monocytes Absolute: 0.5 10*3/uL (ref 0.1–1.0)
Monocytes Relative: 7.2 % (ref 3.0–12.0)
Neutro Abs: 2.6 10*3/uL (ref 1.4–7.7)
Neutrophils Relative %: 39.7 % — ABNORMAL LOW (ref 43.0–77.0)
Platelets: 267 10*3/uL (ref 150.0–400.0)
RBC: 4.12 Mil/uL (ref 3.87–5.11)
RDW: 13.5 % (ref 11.5–15.5)
WBC: 6.7 10*3/uL (ref 4.0–10.5)

## 2020-08-21 LAB — VITAMIN B12: Vitamin B-12: 314 pg/mL (ref 211–911)

## 2020-08-21 LAB — LIPID PANEL
Cholesterol: 113 mg/dL (ref 0–200)
HDL: 44.7 mg/dL (ref >39.00)
LDL Cholesterol: 52 mg/dL (ref 0–99)
NonHDL: 68.18
Total CHOL/HDL Ratio: 3
Triglycerides: 80 mg/dL (ref 0.0–149.0)
VLDL: 16 mg/dL (ref 0.0–40.0)

## 2020-08-21 LAB — VITAMIN D 25 HYDROXY (VIT D DEFICIENCY, FRACTURES): VITD: 11.36 ng/mL — ABNORMAL LOW (ref 30.00–100.00)

## 2020-08-21 LAB — TSH: TSH: 0.72 u[IU]/mL (ref 0.35–4.50)

## 2020-08-21 LAB — HEMOGLOBIN A1C: Hgb A1c MFr Bld: 7.6 % — ABNORMAL HIGH (ref 4.6–6.5)

## 2020-08-21 MED ORDER — METFORMIN HCL 1000 MG PO TABS: 1000.0000 mg | ORAL_TABLET | Freq: Every day | ORAL | 1 refills | Status: DC

## 2020-08-21 MED ORDER — TRULICITY 0.75 MG/0.5ML ~~LOC~~ SOAJ: 0.7500 mg | SUBCUTANEOUS | 2 refills | Status: DC

## 2020-08-21 MED ORDER — ERGOCALCIFEROL 1.25 MG (50000 UT) PO CAPS: 50000.0000 [IU] | ORAL_CAPSULE | ORAL | 0 refills | Status: DC

## 2020-08-21 NOTE — Patient Instructions (Signed)
-Nice seeing you today!!  -Lab work today; will notify you once results are available.  -Stop taking glipizide.  -Continue metformin 1000 mg at bedtime.  -Start Trulicity 8.09 mg injected weekly on Wednesdays.  -Schedule follow up in 3 months.   Preventive Care 24-43 Years Old, Female Preventive care refers to lifestyle choices and visits with your health care provider that can promote health and wellness. This includes:  A yearly physical exam. This is also called an annual wellness visit.  Regular dental and eye exams.  Immunizations.  Screening for certain conditions.  Healthy lifestyle choices, such as: ? Eating a healthy diet. ? Getting regular exercise. ? Not using drugs or products that contain nicotine and tobacco. ? Limiting alcohol use. What can I expect for my preventive care visit? Physical exam Your health care provider will check your:  Height and weight. These may be used to calculate your BMI (body mass index). BMI is a measurement that tells if you are at a healthy weight.  Heart rate and blood pressure.  Body temperature.  Skin for abnormal spots. Counseling Your health care provider may ask you questions about your:  Past medical problems.  Family's medical history.  Alcohol, tobacco, and drug use.  Emotional well-being.  Home life and relationship well-being.  Sexual activity.  Diet, exercise, and sleep habits.  Work and work Statistician.  Access to firearms.  Method of birth control.  Menstrual cycle.  Pregnancy history. What immunizations do I need? Vaccines are usually given at various ages, according to a schedule. Your health care provider will recommend vaccines for you based on your age, medical history, and lifestyle or other factors, such as travel or where you work.   What tests do I need? Blood tests  Lipid and cholesterol levels. These may be checked every 5 years, or more often if you are over 61 years  old.  Hepatitis C test.  Hepatitis B test. Screening  Lung cancer screening. You may have this screening every year starting at age 29 if you have a 30-pack-year history of smoking and currently smoke or have quit within the past 15 years.  Colorectal cancer screening. ? All adults should have this screening starting at age 57 and continuing until age 83. ? Your health care provider may recommend screening at age 14 if you are at increased risk. ? You will have tests every 1-10 years, depending on your results and the type of screening test.  Diabetes screening. ? This is done by checking your blood sugar (glucose) after you have not eaten for a while (fasting). ? You may have this done every 1-3 years.  Mammogram. ? This may be done every 1-2 years. ? Talk with your health care provider about when you should start having regular mammograms. This may depend on whether you have a family history of breast cancer.  BRCA-related cancer screening. This may be done if you have a family history of breast, ovarian, tubal, or peritoneal cancers.  Pelvic exam and Pap test. ? This may be done every 3 years starting at age 31. ? Starting at age 60, this may be done every 5 years if you have a Pap test in combination with an HPV test. Other tests  STD (sexually transmitted disease) testing, if you are at risk.  Bone density scan. This is done to screen for osteoporosis. You may have this scan if you are at high risk for osteoporosis. Talk with your health care provider about your  test results, treatment options, and if necessary, the need for more tests. Follow these instructions at home: Eating and drinking  Eat a diet that includes fresh fruits and vegetables, whole grains, lean protein, and low-fat dairy products.  Take vitamin and mineral supplements as recommended by your health care provider.  Do not drink alcohol if: ? Your health care provider tells you not to drink. ? You are  pregnant, may be pregnant, or are planning to become pregnant.  If you drink alcohol: ? Limit how much you have to 0-1 drink a day. ? Be aware of how much alcohol is in your drink. In the [place] one drink equals one 12 oz bottle of beer (355 mL), one 5 oz glass of wine (148 mL), or one 1 oz glass of hard liquor (44 mL).   Lifestyle  Take daily care of your teeth and gums. Brush your teeth every morning and night with fluoride toothpaste. Floss one time each day.  Stay active. Exercise for at least 30 minutes 5 or more days each week.  Do not use any products that contain nicotine or tobacco, such as cigarettes, e-cigarettes, and chewing tobacco. If you need help quitting, ask your health care provider.  Do not use drugs.  If you are sexually active, practice safe sex. Use a condom or other form of protection to prevent STIs (sexually transmitted infections).  If you do not wish to become pregnant, use a form of birth control. If you plan to become pregnant, see your health care provider for a prepregnancy visit.  If told by your health care provider, take low-dose aspirin daily starting at age 50.  Find healthy ways to cope with stress, such as: ? Meditation, yoga, or listening to music. ? Journaling. ? Talking to a trusted person. ? Spending time with friends and family. Safety  Always wear your seat belt while driving or riding in a vehicle.  Do not drive: ? If you have been drinking alcohol. Do not ride with someone who has been drinking. ? When you are tired or distracted. ? While texting.  Wear a helmet and other protective equipment during sports activities.  If you have firearms in your house, make sure you follow all gun safety procedures. What's next?  Visit your health care provider once a year for an annual wellness visit.  Ask your health care provider how often you should have your eyes and teeth checked.  Stay up to date on all vaccines. This information is  not intended to replace advice given to you by your health care provider. Make sure you discuss any questions you have with your health care provider. Document Revised: 01/09/2020 Document Reviewed: 12/16/2017 Elsevier Patient Education  2021 [employer]  

## 2020-08-21 NOTE — Progress Notes (Signed)
Established Patient Office Visit     This visit occurred during the SARS-CoV-2 public health emergency.  Safety protocols were in place, including screening questions prior to the visit, additional usage of staff PPE, and extensive cleaning of exam room while observing appropriate contact time as indicated for disinfecting solutions.    CC/Reason for Visit: Annual preventive exam, follow-up chronic medical conditions, discuss an acute concern  HPI: Angelica Berg is a 45 y.o. female who is coming in today for the above mentioned reasons. 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  She has routine eye care but no dental care.  She is overdue for Tdap and COVID vaccinations which she declines today.  She is overdue for mammogram.  She had a Pap smear with her GYN 6 months ago.  She does not exercise routinely.  She admits to only taking metformin and glipizide once a day instead of twice due to GI side effects.  She works as a Haematologist.  For the last few months she has been having pain and numbness around her wrist and middle finger sometimes extending up her forearm.   Past Medical/Surgical History: Past Medical History:  Diagnosis Date  . Diabetes mellitus ([place])   . Dyslipidemia     Past Surgical History:  Procedure Laterality Date  . CESAREAN SECTION    . KNEE ARTHROCENTESIS      Social History:  reports that she quit smoking about 2 years ago. She has never used smokeless tobacco. She reports previous alcohol use. She reports current drug use. Frequency: 7.00 times per week. Drug: Marijuana.  Allergies: No Known Allergies  Family History:  Family History  Problem Relation Age of Onset  . Diabetes Mother   . Hypertension Mother   . Cancer Maternal Grandmother        throat  . Heart disease Maternal Grandmother       Current Outpatient Medications:  .  atorvastatin (LIPITOR) 80 MG tablet, TAKE ONE TABLET BY MOUTH DAILY, Disp: 90 tablet, Rfl: 1 .  Blood Glucose Monitoring Suppl (ACCU-CHEK GUIDE) w/Device KIT, 1 Device by Does not apply route daily. E11.9, Disp: 1 kit, Rfl: 0 .  Blood Glucose Monitoring Suppl (ONETOUCH VERIO) w/Device KIT, 1 Device by Does not apply route as directed., Disp: 1 kit, Rfl: 0 .  cyclobenzaprine (FLEXERIL) 5 MG tablet, Take 1 tablet (5 mg total) by mouth 3 times/day as needed-between meals & bedtime for muscle spasms., Disp: 30 tablet, Rfl: 0 .  Dulaglutide (TRULICITY) 0.30 SP/2.3RA SOPN, Inject 0.75 mg into the skin once a week., Disp: 2 mL, Rfl: 2 .  ergocalciferol (VITAMIN D2) 1.25 MG (50000 UT) capsule, Take 1 capsule (50,000 Units total) by mouth once a week., Disp: 12 capsule, Rfl: 0 .  glucose blood (ACCU-CHEK GUIDE) test strip, Use as instructed to test blood sugar 2 times daily E11.9, Disp: 100 each, Rfl: 12 .  lisinopril (ZESTRIL) 5 MG tablet, Take 1 tablet (5 mg total) by mouth daily., Disp: 90 tablet, Rfl: 1 .  metFORMIN (GLUCOPHAGE) 1000 MG tablet, Take 1 tablet (1,000 mg total) by mouth at bedtime., Disp: 180 tablet, Rfl: 1  Review of Systems:  Constitutional: Denies fever, chills, diaphoresis, appetite change and fatigue.  HEENT: Denies photophobia, eye pain, redness, hearing loss, ear pain, congestion, sore throat, rhinorrhea, sneezing, mouth sores, trouble swallowing, neck pain, neck stiffness and tinnitus.  Respiratory: Denies SOB, DOE, cough, chest tightness,  and wheezing.   Cardiovascular: Denies chest pain, palpitations and leg swelling.  Gastrointestinal: Denies nausea, vomiting, abdominal pain, diarrhea, constipation, blood in stool and abdominal distention.  Genitourinary: Denies dysuria, urgency, frequency, hematuria, flank pain and difficulty urinating.  Endocrine: Denies: hot or cold intolerance, sweats, changes in hair or nails, polyuria,  polydipsia. Musculoskeletal: Denies myalgias, back pain, joint swelling, arthralgias and gait problem.  Skin: Denies pallor, rash and wound.  Neurological: Denies dizziness, seizures, syncope, weakness, light-headedness, numbness and headaches.  Hematological: Denies adenopathy. Easy bruising, personal or family bleeding history  Psychiatric/Behavioral: Denies suicidal ideation, mood changes, confusion, nervousness, sleep disturbance and agitation    Physical Exam: Vitals:   08/21/20 0905  BP: 130/80  Temp: 98 F (36.7 C)  TempSrc: Oral  Weight: 268 lb 11.2 oz (121.9 kg)  Height: '5\' 10"'  (1.778 m)    Body mass index is 38.55 kg/m.   Constitutional: NAD, calm, comfortable, obese Eyes: PERRL, lids and conjunctivae normal, wears corrective lenses ENMT: Mucous membranes are moist. Posterior pharynx clear of any exudate or lesions. Normal dentition. Tympanic membrane is pearly white, no erythema or bulging. Neck: normal, supple, no masses, no thyromegaly Respiratory: clear to auscultation bilaterally, no wheezing, no crackles. Normal respiratory effort. No accessory muscle use.  Cardiovascular: Regular rate and rhythm, no murmurs / rubs / gallops. No extremity edema. 2+ pedal pulses. No carotid bruits.  Abdomen: no tenderness, no masses palpated. No hepatosplenomegaly. Bowel sounds positive.  Musculoskeletal: no clubbing / cyanosis. No joint deformity upper and lower extremities. Good ROM, no contractures. Normal muscle tone.  Skin: no rashes, lesions, ulcers. No induration Neurologic: CN 2-12 grossly intact. Sensation intact, DTR normal. Strength 5/5 in all 4.  Psychiatric: Normal judgment and insight. Alert and oriented x 3. Normal mood.    Impression and Plan:  Encounter for preventive health examination  -Advised routine eye and dental care. -She is overdue for COVID series and Tdap, she declines today. -Screening labs today. -Healthy lifestyle discussed in detail. -Commence  routine colon cancer screening age 56. -Sent for mammogram today. -She had a Pap smear 6 months ago with her GYN.  Morbid obesity ([place]) -Discussed healthy lifestyle, including increased physical activity and better food choices to promote weight loss.  Vitamin D deficiency  - Plan: VITAMIN D 25 Hydroxy (Vit-D Deficiency, Fractures)  Hyperlipidemia associated with type 2 diabetes mellitus ([place])  - Plan: Lipid panel  -Goal LDL less than 70. -She is currently on atorvastatin 80 mg daily. -Last LDL was 188 with total cholesterol of 283 and triglycerides of 196 in April 2021.  Transaminitis  -Due to nonalcoholic steatohepatitis, recheck LFTs next visit.  Type 2 diabetes mellitus with microalbuminuria, without long-term current use of insulin ([place])  -Suspect she is not at goal given noncompliance with medication due to GI side effects. -Start Trulicity 3.71 mg daily, first injection given in office today. -Continue metformin 1000 mg daily, okay to discontinue glipizide. -Recheck A1c in 3 months.  Right carpal tunnel syndrome  - Plan: Ambulatory referral to Sports Medicine for further evaluation, may benefit from cortisone injection.   Patient Instructions   -Nice seeing you today!!  -Lab work today; will notify you once results are available.  -Stop taking glipizide.  -Continue metformin 1000 mg at bedtime.  -Start Trulicity 0.62 mg injected weekly on Wednesdays.  -Schedule follow up in 3 months.   Preventive Care 33-24 Years Old, Female Preventive care refers to lifestyle choices and  visits with your health care provider that can promote health and wellness. This includes:  A yearly physical exam. This is also called an annual wellness visit.  Regular dental and eye exams.  Immunizations.  Screening for certain conditions.  Healthy lifestyle choices, such as: ? Eating a healthy diet. ? Getting regular exercise. ? Not using drugs or products that contain nicotine  and tobacco. ? Limiting alcohol use. What can I expect for my preventive care visit? Physical exam Your health care provider will check your:  Height and weight. These may be used to calculate your BMI (body mass index). BMI is a measurement that tells if you are at a healthy weight.  Heart rate and blood pressure.  Body temperature.  Skin for abnormal spots. Counseling Your health care provider may ask you questions about your:  Past medical problems.  Family's medical history.  Alcohol, tobacco, and drug use.  Emotional well-being.  Home life and relationship well-being.  Sexual activity.  Diet, exercise, and sleep habits.  Work and work Statistician.  Access to firearms.  Method of birth control.  Menstrual cycle.  Pregnancy history. What immunizations do I need? Vaccines are usually given at various ages, according to a schedule. Your health care provider will recommend vaccines for you based on your age, medical history, and lifestyle or other factors, such as travel or where you work.   What tests do I need? Blood tests  Lipid and cholesterol levels. These may be checked every 5 years, or more often if you are over 7 years old.  Hepatitis C test.  Hepatitis B test. Screening  Lung cancer screening. You may have this screening every year starting at age 1 if you have a 30-pack-year history of smoking and currently smoke or have quit within the past 15 years.  Colorectal cancer screening. ? All adults should have this screening starting at age 55 and continuing until age 48. ? Your health care provider may recommend screening at age 42 if you are at increased risk. ? You will have tests every 1-10 years, depending on your results and the type of screening test.  Diabetes screening. ? This is done by checking your blood sugar (glucose) after you have not eaten for a while (fasting). ? You may have this done every 1-3 years.  Mammogram. ? This may be  done every 1-2 years. ? Talk with your health care provider about when you should start having regular mammograms. This may depend on whether you have a family history of breast cancer.  BRCA-related cancer screening. This may be done if you have a family history of breast, ovarian, tubal, or peritoneal cancers.  Pelvic exam and Pap test. ? This may be done every 3 years starting at age 46. ? Starting at age 20, this may be done every 5 years if you have a Pap test in combination with an HPV test. Other tests  STD (sexually transmitted disease) testing, if you are at risk.  Bone density scan. This is done to screen for osteoporosis. You may have this scan if you are at high risk for osteoporosis. Talk with your health care provider about your test results, treatment options, and if necessary, the need for more tests. Follow these instructions at home: Eating and drinking  Eat a diet that includes fresh fruits and vegetables, whole grains, lean protein, and low-fat dairy products.  Take vitamin and mineral supplements as recommended by your health care provider.  Do not drink alcohol  if: ? Your health care provider tells you not to drink. ? You are pregnant, may be pregnant, or are planning to become pregnant.  If you drink alcohol: ? Limit how much you have to 0-1 drink a day. ? Be aware of how much alcohol is in your drink. In the [place] one drink equals one 12 oz bottle of beer (355 mL), one 5 oz glass of wine (148 mL), or one 1 oz glass of hard liquor (44 mL).   Lifestyle  Take daily care of your teeth and gums. Brush your teeth every morning and night with fluoride toothpaste. Floss one time each day.  Stay active. Exercise for at least 30 minutes 5 or more days each week.  Do not use any products that contain nicotine or tobacco, such as cigarettes, e-cigarettes, and chewing tobacco. If you need help quitting, ask your health care provider.  Do not use drugs.  If you are  sexually active, practice safe sex. Use a condom or other form of protection to prevent STIs (sexually transmitted infections).  If you do not wish to become pregnant, use a form of birth control. If you plan to become pregnant, see your health care provider for a prepregnancy visit.  If told by your health care provider, take low-dose aspirin daily starting at age 28.  Find healthy ways to cope with stress, such as: ? Meditation, yoga, or listening to music. ? Journaling. ? Talking to a trusted person. ? Spending time with friends and family. Safety  Always wear your seat belt while driving or riding in a vehicle.  Do not drive: ? If you have been drinking alcohol. Do not ride with someone who has been drinking. ? When you are tired or distracted. ? While texting.  Wear a helmet and other protective equipment during sports activities.  If you have firearms in your house, make sure you follow all gun safety procedures. What's next?  Visit your health care provider once a year for an annual wellness visit.  Ask your health care provider how often you should have your eyes and teeth checked.  Stay up to date on all vaccines. This information is not intended to replace advice given to you by your health care provider. Make sure you discuss any questions you have with your health care provider. Document Revised: 01/09/2020 Document Reviewed: 12/16/2017 Elsevier Patient Education  2021 [place], MD [facility]

## 2020-08-23 ENCOUNTER — Encounter: Payer: Self-pay | Admitting: Internal Medicine

## 2020-08-23 NOTE — Progress Notes (Signed)
I, Angelica Berg, Berg, ATC acting as a scribe for Angelica Berg.  Subjective:    I'm seeing this patient as a consultation for:  Angelica Berg. Note will be routed back to referring provider/PCP.  CC: Right wrist pain  HPI: Pt is a 45 y/o female presenting w/ R wrist/hand pain x several months w/ no known MOI.  She locates her pain to across R carpals, 3-5 MC and fingers. Pt reports numbness/tingling esp in the morning and increased stiffness. 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  Radiating pain: yes- to elbow Swelling: yes- across carpals R UE numbness/tingling: yes in her wrist and R middle finger Aggravating factors: nothing Treatments tried: naproxen, IBU, Tylenol  Past medical history, Surgical history, Family history, Social history, Allergies, and medications have been entered into the medical record, reviewed. Diabetes.  Vitamin D deficiency.  Review of Systems: No new headache, visual changes, nausea, vomiting, diarrhea, constipation, dizziness, abdominal pain, skin rash, fevers, chills, night sweats, weight loss, swollen lymph nodes, body aches, joint swelling, muscle aches, chest pain, shortness of breath, mood changes, visual or auditory hallucinations.   Objective:    Vitals:   08/26/20 1010  BP: (!) 138/101   General: Well Developed, well nourished, and in no acute distress.  Neuro/Psych: Alert and oriented x3, extra-ocular muscles intact, able to move all 4 extremities, sensation grossly intact. Skin: Warm and dry, no rashes noted.  Respiratory: Not using accessory muscles, speaking in full sentences, trachea midline.  Cardiovascular: Pulses palpable, no extremity edema. Abdomen: Does not appear distended. MSK: C-spine normal-appearing nontender normal cervical motion negative Spurling's test. Right elbow normal-appearing positive Tinel's cubital tunnel.  Reproduction of ulnar paresthesia hand with persistent elbow flexion. Right wrist  normal-appearing nontender negative Tinel's and Phalen's test Right hand normal-appearing however tender to palpation with stiffness at MCPs 3 4 and 5. No triggering present with hand flexion.    Lab and Radiology Results  X-ray images right wrist obtained today personally and independently interpreted No acute fractures.  No significant degenerative changes. Await formal radiology review  Lab Results  Component Value Date   VITAMINB12 314 08/21/2020   Last vitamin D Lab Results  Component Value Date   VD25OH 11.36 (L) 08/21/2020     Impression and Recommendations:    Assessment and Plan: 45 y.o. female with right hand stiffness.  Unclear etiology.  Possible rheumatologic issue.  The hand stiffness is probably unrelated to the paresthesias.  Plan for rheumatologic work-up listed below.  Treat with naproxen as that has been helpful however predominant treatment will be hand physical therapy.  Recheck in 1 month.  Additionally patient has paresthesias to the digits 3 4 and 5 of her right hand which is a bit unusual.  This is not consistent with classic cubital tunnel or carpal tunnel or a specific cervical radicular dermatomal pattern.  Dominant issue seems to be cubital tunnel syndrome.  Plan to treat with elbow splint and gabapentin at bedtime..  She probably does have a small component of carpal tunnel syndrome as well to explain the digit 3 paresthesia.  Plan for carpal tunnel wrist brace at night along with the gabapentin.  Vitamin D was quite low.  Currently on vitamin D repletion.  B12 was okay when checked recently.  Recheck in 1 month.  PDMP not reviewed this encounter. Orders Placed This Encounter  Procedures  . DG Wrist Complete Right    Standing Status:   Future  Number of Occurrences:   1    Standing Expiration Date:   08/26/2021    Order Specific Question:   Reason for Exam (SYMPTOM  OR DIAGNOSIS REQUIRED)    Answer:   right wrist pain    Order Specific  Question:   Preferred imaging location?    Answer:   Angelica Berg    Order Specific Question:   Is patient pregnant?    Answer:   No  . Sedimentation rate    Standing Status:   Future    Number of Occurrences:   1    Standing Expiration Date:   08/26/2021  . Rheumatoid factor    Standing Status:   Future    Number of Occurrences:   1    Standing Expiration Date:   08/26/2021  . ANA    Standing Status:   Future    Number of Occurrences:   1    Standing Expiration Date:   08/26/2021  . Uric acid    Standing Status:   Future    Number of Occurrences:   1    Standing Expiration Date:   08/26/2021  . CK    Standing Status:   Future    Number of Occurrences:   1    Standing Expiration Date:   08/26/2021  . Cyclic citrul peptide antibody, IgG    Standing Status:   Future    Number of Occurrences:   1    Standing Expiration Date:   08/26/2021  . Ambulatory referral to Physical Therapy    Referral Priority:   Routine    Referral Type:   Physical Medicine    Referral Reason:   Specialty Services Required    Requested Specialty:   Physical Therapy    Number of Visits Requested:   1   Meds ordered this encounter  Medications  . naproxen (NAPROSYN) 500 MG tablet    Sig: Take 1 tablet (500 mg total) by mouth 2 (two) times daily with a meal.    Dispense:  60 tablet    Refill:  0  . gabapentin (NEURONTIN) 300 MG capsule    Sig: Take 1 capsule (300 mg total) by mouth 3 (three) times daily as needed.    Dispense:  90 capsule    Refill:  3    Discussed warning signs or symptoms. Please see discharge instructions. Patient expresses understanding.   The above documentation has been reviewed and is accurate and complete Angelica Berg, M.D.

## 2020-08-26 ENCOUNTER — Other Ambulatory Visit: Payer: Self-pay

## 2020-08-26 ENCOUNTER — Ambulatory Visit (INDEPENDENT_AMBULATORY_CARE_PROVIDER_SITE_OTHER): Payer: Medicaid Other

## 2020-08-26 ENCOUNTER — Ambulatory Visit: Payer: Self-pay

## 2020-08-26 ENCOUNTER — Ambulatory Visit (INDEPENDENT_AMBULATORY_CARE_PROVIDER_SITE_OTHER): Payer: Medicaid Other | Admitting: Family Medicine

## 2020-08-26 ENCOUNTER — Encounter: Payer: Self-pay | Admitting: Family Medicine

## 2020-08-26 VITALS — BP 138/101 | Ht 70.0 in | Wt 271.4 lb

## 2020-08-26 DIAGNOSIS — E559 Vitamin D deficiency, unspecified: Secondary | ICD-10-CM | POA: Diagnosis not present

## 2020-08-26 DIAGNOSIS — M79641 Pain in right hand: Secondary | ICD-10-CM | POA: Diagnosis not present

## 2020-08-26 DIAGNOSIS — G8929 Other chronic pain: Secondary | ICD-10-CM

## 2020-08-26 DIAGNOSIS — M25531 Pain in right wrist: Secondary | ICD-10-CM

## 2020-08-26 DIAGNOSIS — G5621 Lesion of ulnar nerve, right upper limb: Secondary | ICD-10-CM | POA: Diagnosis not present

## 2020-08-26 LAB — SEDIMENTATION RATE: Sed Rate: 9 mm/hr (ref 0–20)

## 2020-08-26 LAB — URIC ACID: Uric Acid, Serum: 4.7 mg/dL (ref 2.4–7.0)

## 2020-08-26 LAB — CK: Total CK: 159 U/L (ref 7–177)

## 2020-08-26 MED ORDER — GABAPENTIN 300 MG PO CAPS: 300.0000 mg | ORAL_CAPSULE | Freq: Three times a day (TID) | ORAL | 3 refills | Status: DC | PRN

## 2020-08-26 MED ORDER — NAPROXEN 500 MG PO TABS: 500.0000 mg | ORAL_TABLET | Freq: Two times a day (BID) | ORAL | 0 refills | Status: DC

## 2020-08-26 NOTE — Patient Instructions (Addendum)
Thank you for coming in today.  Please get an Xray today before you leave Please get labs today before you leave  Please use Voltaren gel (Generic Diclofenac Gel) up to 4x daily for pain as needed.  This is available over-the-counter as both the name brand Voltaren gel and the generic diclofenac gel.  Use towel brace at night on the elbow.   Try carpal tunnel wrist brace at night.   For pain take naproxen 500mg  twice daily as needed.  Do not take with aleve or ibuprofen.  Ok to take tylenol.   Take the gabapentin up to 3x dialy for nerve pain but it will make you sleepy to maybe just take it at night.   Plan for hand PT.   Recheck with me in 1 month.     Cubital Tunnel Syndrome  Cubital tunnel syndrome is a condition that causes pain and weakness of the forearm and hand. It happens when one of the nerves that runs along the inside of the elbow joint (ulnar nerve) becomes irritated. This condition is usually caused by repeated arm motions that are done during sports or work-related activities. What are the causes? This condition may be caused by:  Increased pressure on the ulnar nerve at the elbow, arm, or forearm. This can result from: ? Irritation caused by repeated elbow bending. ? Poorly healed elbow fractures. ? Tumors in the elbow. These are usually noncancerous (benign). ? Scar tissue that develops in the elbow after an injury. ? Bony growths (spurs) near the ulnar nerve.  Stretching of the nerve due to loose elbow ligaments.  Trauma to the nerve at the elbow. What increases the risk? The following factors may make you more likely to develop this condition:  Doing manual labor that requires frequent bending of the elbow.  Playing sports that include repeated or strenuous throwing motions, such as baseball.  Playing contact sports, such as football or lacrosse.  Not warming up properly before activities.  Having diabetes.  Having an underactive thyroid  (hypothyroidism). What are the signs or symptoms? Symptoms of this condition include:  Clumsiness or weakness of the hand.  Tenderness of the inner elbow.  Aching or soreness of the inner elbow, forearm, or fingers, especially the little finger or the ring finger.  Increased pain when forcing the elbow to bend.  Reduced control when throwing objects.  Tingling, numbness, or a burning feeling inside the forearm or in part of the hand or fingers, especially the little finger or the ring finger.  Sharp pains that shoot from the elbow down to the wrist and hand.  The inability to grip or pinch hard. How is this diagnosed? This condition is diagnosed based on:  Your symptoms and medical history. Your health care provider will also ask for details about any injury.  A physical exam. You may also have tests, including:  Electromyogram (EMG). This test measures electrical signals sent by your nerves into the muscles.  Nerve conduction study. This test measures how well electrical signals pass through your nerves.  Imaging tests, such as X-rays, ultrasound, and MRI. These tests check for possible causes of your condition. How is this treated? This condition may be treated by:  Stopping the activities that are causing your symptoms to get worse.  Icing and taking medicines to reduce pain and swelling.  Wearing a splint to prevent your elbow from bending, or wearing an elbow pad where the ulnar nerve is closest to the skin.  Working with  a physical therapist in less severe cases. This may help to: ? Decrease your symptoms. ? Improve the strength and range of motion of your elbow, forearm, and hand. If these treatments do not help, surgery may be needed. Follow these instructions at home: If you have a splint:  Wear the splint as told by your health care provider. Remove it only as told by your health care provider.  Loosen the splint if your fingers tingle, become numb, or turn  cold and blue.  Keep the splint clean.  If the splint is not waterproof: ? Do not let it get wet. ? Cover it with a watertight covering when you take a bath or shower. Managing pain, stiffness, and swelling  If directed, put ice on the injured area: ? Put ice in a plastic bag. ? Place a towel between your skin and the bag. ? Leave the ice on for 20 minutes, 2-3 times a day.  Move your fingers often to avoid stiffness and to lessen swelling.  Raise (elevate) the injured area above the level of your heart while you are sitting or lying down.   General instructions  Take over-the-counter and prescription medicines only as told by your health care provider.  Do any exercise or physical therapy as told by your health care provider.  Do not drive or use heavy machinery while taking prescription pain medicine.  If you were given an elbow pad, wear it as told by your health care provider.  Keep all follow-up visits as told by your health care provider. This is important. Contact a health care provider if:  Your symptoms get worse.  Your symptoms do not get better with treatment.  You have new pain.  Your hand on the injured side feels numb or cold. Summary  Cubital tunnel syndrome is a condition that causes pain and weakness of the forearm and hand.  You are more likely to develop this condition if you do work or play sports that involve repeated arm movements.  This condition is often treated by stopping repetitive activities, applying ice, and using anti-inflammatory medicines.  In rare cases, surgery may be needed. This information is not intended to replace advice given to you by your health care provider. Make sure you discuss any questions you have with your health care provider. Document Revised: 08/23/2017 Document Reviewed: 08/23/2017 Elsevier Patient Education  2021 [employer].

## 2020-08-27 NOTE — Progress Notes (Signed)
Right wrist x-ray looks normal to radiology

## 2020-08-27 NOTE — Progress Notes (Signed)
Early labs coming back are normal so far.  More labs are pending

## 2020-08-28 LAB — ANTI-NUCLEAR AB-TITER (ANA TITER): ANA Titer 1: 1:40 {titer} — ABNORMAL HIGH

## 2020-08-28 LAB — ANA: Anti Nuclear Antibody (ANA): POSITIVE — AB

## 2020-08-28 LAB — CYCLIC CITRUL PEPTIDE ANTIBODY, IGG: Cyclic Citrullin Peptide Ab: <16 UNITS

## 2020-08-28 LAB — RHEUMATOID FACTOR: Rheumatoid fact SerPl-aCnc: <14 IU/mL (ref <14)

## 2020-08-29 NOTE — Progress Notes (Signed)
CCP a marker for rheumatoid arthritis is negative.  Rheumatoid factor again a marker for rheumatoid arthritis is negative.  ANA is positive with a titer of 1: 40 which indicates probably no disease

## 2020-09-05 ENCOUNTER — Other Ambulatory Visit: Payer: Self-pay | Admitting: Internal Medicine

## 2020-09-05 DIAGNOSIS — E559 Vitamin D deficiency, unspecified: Secondary | ICD-10-CM

## 2020-09-20 NOTE — Progress Notes (Signed)
I, Christoper Fabian, LAT, ATC, am serving as scribe for Dr. [name].  Angelica Berg is a 45 y.o. female who presents to [facility] today for R hand/wrist pain and numbness/tingling f/u.  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  She was last seen by Dr. Denyse Amass on 08/26/20 and was referred to PT at [facility] but did not complete any visits due to that location not accepting her insurance.  She was also prescribed naproxen and Gabapentin.  She was also advised to use an elbow and wrist brace at night.  Since her last visit, pt reports that she did not take the Gabapentin due to some of the side effects, specifically suicidal thoughts.  She has a brace that she has been wearing pretty much constantly.  She locates her pain to her R hand at R MCP joints 3-5 that radiates into her ulnar wrist and forearm.  She has taken some Aleve to help w/ pain due to Naproxen not working.  She con't to have numbness and tingling in her R 3rd and 4th fingers particularly when working as a Interior and spatial designer.  Diagnostic testing: R wrist XR- 08/26/20  Pertinent review of systems: No fevers or chills  Relevant historical information: Hyperlipidemia and diabetes.  No history of depression   Exam:  BP 128/84 (BP Location: Left Arm, Patient Position: Sitting, Cuff Size: Large)   Ht 5\' 10"  (1.778 m)   Wt 262 lb 9.6 oz (119.1 kg)   BMI 37.68 kg/m  General: Well Developed, well nourished, and in no acute distress.   MSK: C-spine normal.  Nontender normal cervical motion.  Arm symptoms relieved by left lateral flexion and shoulder abduction.  Right hand normal appearing.  Normal hand motion.    Lab and Radiology Results  X-ray images C-spine obtained today personally and independently interpreted Loss of cervical lordosis.  Minimal degenerative changes no acute fractures. Await formal radiology review  Recent Results (from the past 2160 hour(s))  VITAMIN D 25 Hydroxy (Vit-D  Deficiency, Fractures)     Status: Abnormal   Collection Time: 08/21/20  9:45 AM  Result Value Ref Range   VITD 11.36 (L) 30.00 - 100.00 ng/mL  Vitamin B12     Status: None   Collection Time: 08/21/20  9:45 AM  Result Value Ref Range   Vitamin B-12 314 211 - 911 pg/mL  TSH     Status: None   Collection Time: 08/21/20  9:45 AM  Result Value Ref Range   TSH 0.72 0.35 - 4.50 uIU/mL  Lipid panel     Status: None   Collection Time: 08/21/20  9:45 AM  Result Value Ref Range   Cholesterol 113 0 - 200 mg/dL    Comment: ATP III Classification       Desirable:  < 200 mg/dL               Borderline High:  200 - 239 mg/dL          High:  > = 10/21/20 mg/dL   Triglycerides 470 0.0 - 149.0 mg/dL    Comment: Normal:  96.2 mg/dLBorderline High:  150 - 199 mg/dL   HDL <836 62.94 mg/dL   VLDL >76.54 0.0 - 65.0 mg/dL   LDL Cholesterol 52 0 - 99 mg/dL   Total CHOL/HDL Ratio 3     Comment:                Men  Women1/2 Average Risk     3.4          3.3Average Risk          5.0          4.42X Average Risk          9.6          7.13X Average Risk          15.0          11.0                       NonHDL 68.18     Comment: NOTE:  Non-HDL goal should be 30 mg/dL higher than patient's LDL goal (i.e. LDL goal of < 70 mg/dL, would have non-HDL goal of < 100 mg/dL)  Hemoglobin [id number]     Status: Abnormal   Collection Time: 08/21/20  9:45 AM  Result Value Ref Range   Hgb A1c MFr Bld 7.6 (H) 4.6 - 6.5 %    Comment: Glycemic Control Guidelines for People with Diabetes:Non Diabetic:  <6%Goal of Therapy: <7%Additional Action Suggested:  >8%   Comprehensive metabolic panel     Status: Abnormal   Collection Time: 08/21/20  9:45 AM  Result Value Ref Range   Sodium 140 135 - 145 mEq/L   Potassium 4.3 3.5 - 5.1 mEq/L   Chloride 105 96 - 112 mEq/L   CO2 28 19 - 32 mEq/L   Glucose, Bld 159 (H) 70 - 99 mg/dL   BUN 7 6 - 23 mg/dL   Creatinine, Ser 0.960.60 0.40 - 1.20 mg/dL   Total Bilirubin 1.4 (H) 0.2 - 1.2 mg/dL    Alkaline Phosphatase 50 39 - 117 U/L   AST 15 0 - 37 U/L   ALT 18 0 - 35 U/L   Total Protein 6.6 6.0 - 8.3 g/dL   Albumin 4.0 3.5 - 5.2 g/dL   GFR 045.40110.17 >98.11>60.00 mL/min    Comment: Calculated using the CKD-EPI Creatinine Equation (2021)   Calcium 8.7 8.4 - 10.5 mg/dL  CBC with Differential/Platelet     Status: Abnormal   Collection Time: 08/21/20  9:45 AM  Result Value Ref Range   WBC 6.7 4.0 - 10.5 K/uL   RBC 4.12 3.87 - 5.11 Mil/uL   Hemoglobin 13.0 12.0 - 15.0 g/dL   HCT 91.438.3 78.236.0 - 95.646.0 %   MCV 92.9 78.0 - 100.0 fl   MCHC 34.0 30.0 - 36.0 g/dL   RDW 21.313.5 08.611.5 - 57.815.5 %   Platelets 267.0 150.0 - 400.0 K/uL   Neutrophils Relative % 39.7 (L) 43.0 - 77.0 %   Lymphocytes Relative 50.0 (H) 12.0 - 46.0 %   Monocytes Relative 7.2 3.0 - 12.0 %   Eosinophils Relative 2.9 0.0 - 5.0 %   Basophils Relative 0.2 0.0 - 3.0 %   Neutro Abs 2.6 1.4 - 7.7 K/uL   Lymphs Abs 3.3 0.7 - 4.0 K/uL   Monocytes Absolute 0.5 0.1 - 1.0 K/uL   Eosinophils Absolute 0.2 0.0 - 0.7 K/uL   Basophils Absolute 0.0 0.0 - 0.1 K/uL  Cyclic citrul peptide antibody, IgG     Status: None   Collection Time: 08/26/20 11:00 AM  Result Value Ref Range   Cyclic Citrullin Peptide Ab <46<16 UNITS    Comment: Reference Range Negative:            <20 Weak Positive:       20-39 Moderate Positive:  40-59 Strong Positive:     >59 .   CK     Status: None   Collection Time: 08/26/20 11:00 AM  Result Value Ref Range   Total CK 159 7 - 177 U/L  Uric acid     Status: None   Collection Time: 08/26/20 11:00 AM  Result Value Ref Range   Uric Acid, Serum 4.7 2.4 - 7.0 mg/dL  ANA     Status: Abnormal   Collection Time: 08/26/20 11:00 AM  Result Value Ref Range   Anti Nuclear Antibody (ANA) POSITIVE (A) NEGATIVE    Comment: ANA IFA is a first line screen for detecting the presence of up to approximately 150 autoantibodies in various autoimmune diseases. A positive ANA IFA result is suggestive of autoimmune disease and reflexes  to titer and pattern. Further laboratory testing may be considered if clinically indicated. . For additional information, please refer to http://education.[facility].com/faq/FAQ177 (This link is being provided for informational/ educational purposes only.) .   Rheumatoid factor     Status: None   Collection Time: 08/26/20 11:00 AM  Result Value Ref Range   Rhuematoid fact SerPl-aCnc <14 <14 IU/mL  Sedimentation rate     Status: None   Collection Time: 08/26/20 11:00 AM  Result Value Ref Range   Sed Rate 9 0 - 20 mm/hr  Anti-nuclear ab-titer (ANA titer)     Status: Abnormal   Collection Time: 08/26/20 11:00 AM  Result Value Ref Range   ANA Titer 1 1:40 (H) titer    Comment: A low level ANA titer may be present in pre-clinical autoimmune diseases and normal individuals.                 Reference Range                 <1:40        Negative                 1:40-1:80    Low Antibody Level                 >1:80        Elevated Antibody Level .    ANA Pattern 1 Nuclear, Speckled (A)     Comment: Speckled pattern is associated with mixed connective tissue disease (MCTD), systemic lupus erythematosus (SLE), Sjogren's syndrome, dermatomyositis, and  systemic sclerosis/polymyositis overlap. . AC-2,4,5,29: Speckled . International Consensus on ANA Patterns ([email or web address])         Assessment and Plan: 45 y.o. female with right arm pain thought to be perhaps C8 radiculopathy.  She may also have cubital tunnel and/or carpal tunnel syndrome as well.  Symptoms are difficult to fully characterize based on distribution of symptoms into the ulnar 3 digits.  Plan for MRI C-spine to further characterize potential radicular symptoms of right arm certainly has she is failing conservative management now.  Additionally well proceed with nerve conduction study.  New referral placed for occupational therapy  Patient had rheumatologic work-up at the last  visit that as noted above was largely negative.  1 thing that was positive was ANA with a titer of 1: 40 which probably represents negative rheumatologic work-up.  PDMP not reviewed this encounter. Orders Placed This Encounter  Procedures  . DG Cervical Spine 2 or 3 views    Standing Status:   Future    Number of Occurrences:   1    Standing Expiration Date:   09/23/2021    Order Specific Question:  Reason for Exam (SYMPTOM  OR DIAGNOSIS REQUIRED)    Answer:   cerv rad    Order Specific Question:   Is patient pregnant?    Answer:   No    Order Specific Question:   Preferred imaging location?    Answer:   [facility]  . MR CERVICAL SPINE WO CONTRAST    Standing Status:   Future    Standing Expiration Date:   09/23/2021    Order Specific Question:   What is the patient's sedation requirement?    Answer:   No Sedation    Order Specific Question:   Does the patient have a pacemaker or implanted devices?    Answer:   No    Order Specific Question:   Preferred imaging location?    Answer:   [facility] (table limit-350lbs)  . Ambulatory referral to Occupational Therapy    Referral Priority:   Routine    Referral Type:   Occupational Therapy    Referral Reason:   Specialty Services Required    Requested Specialty:   Occupational Therapy    Number of Visits Requested:   1  . Ambulatory referral to Neurology    Referral Priority:   Routine    Referral Type:   Consultation    Referral Reason:   Specialty Services Required    Requested Specialty:   Neurology    Number of Visits Requested:   1  . NCV with EMG(electromyography)    Standing Status:   Future    Standing Expiration Date:   09/23/2021    Order Specific Question:   Where should this test be performed?    Answer:   [facility]   Meds ordered this encounter  Medications  . ibuprofen (ADVIL) 800 MG tablet    Sig: Take 1 tablet (800 mg total) by mouth every 8 (eight) hours as needed.    Dispense:  90 tablet    Refill:   2     Discussed warning signs or symptoms. Please see discharge instructions. Patient expresses understanding.   The above documentation has been reviewed and is accurate and complete [name], M.D.

## 2020-09-22 ENCOUNTER — Other Ambulatory Visit: Payer: Self-pay | Admitting: Family Medicine

## 2020-09-23 ENCOUNTER — Encounter: Payer: Self-pay | Admitting: Family Medicine

## 2020-09-23 ENCOUNTER — Ambulatory Visit (INDEPENDENT_AMBULATORY_CARE_PROVIDER_SITE_OTHER): Payer: Medicaid Other

## 2020-09-23 ENCOUNTER — Other Ambulatory Visit: Payer: Self-pay

## 2020-09-23 ENCOUNTER — Ambulatory Visit (INDEPENDENT_AMBULATORY_CARE_PROVIDER_SITE_OTHER): Payer: Medicaid Other | Admitting: Family Medicine

## 2020-09-23 VITALS — BP 128/84 | Ht 70.0 in | Wt 262.6 lb

## 2020-09-23 DIAGNOSIS — G8929 Other chronic pain: Secondary | ICD-10-CM

## 2020-09-23 DIAGNOSIS — M25531 Pain in right wrist: Secondary | ICD-10-CM

## 2020-09-23 DIAGNOSIS — G5621 Lesion of ulnar nerve, right upper limb: Secondary | ICD-10-CM | POA: Diagnosis not present

## 2020-09-23 DIAGNOSIS — M542 Cervicalgia: Secondary | ICD-10-CM | POA: Diagnosis not present

## 2020-09-23 DIAGNOSIS — M5412 Radiculopathy, cervical region: Secondary | ICD-10-CM

## 2020-09-23 DIAGNOSIS — M79641 Pain in right hand: Secondary | ICD-10-CM

## 2020-09-23 MED ORDER — IBUPROFEN 800 MG PO TABS: 800.0000 mg | ORAL_TABLET | Freq: Three times a day (TID) | ORAL | 2 refills | Status: DC | PRN

## 2020-09-23 NOTE — Telephone Encounter (Signed)
Rx refill request approved per Dr. Corey's orders. 

## 2020-09-23 NOTE — Patient Instructions (Addendum)
Thank you for coming in today.  Please get an Xray today before you leave  You should hear from MRI scheduling within 1 week. If you do not hear please let me know.   Plan for neurology to do a nerve conduction study. You should hear from them soon.   Let me know if you dont hear about that MRI and the neurology in 1 week  Let me know if you have trouble getting in with occupational therapy

## 2020-09-24 NOTE — Progress Notes (Signed)
X-ray cervical spine shows evidence of muscle spasm but otherwise looks normal to radiology

## 2020-09-29 ENCOUNTER — Other Ambulatory Visit: Payer: Self-pay

## 2020-09-29 ENCOUNTER — Ambulatory Visit (INDEPENDENT_AMBULATORY_CARE_PROVIDER_SITE_OTHER): Payer: Medicaid Other

## 2020-09-29 DIAGNOSIS — G8929 Other chronic pain: Secondary | ICD-10-CM | POA: Diagnosis not present

## 2020-09-29 DIAGNOSIS — M25531 Pain in right wrist: Secondary | ICD-10-CM

## 2020-09-29 DIAGNOSIS — M79641 Pain in right hand: Secondary | ICD-10-CM | POA: Diagnosis not present

## 2020-09-29 DIAGNOSIS — M50223 Other cervical disc displacement at C6-C7 level: Secondary | ICD-10-CM | POA: Diagnosis not present

## 2020-09-29 DIAGNOSIS — M50222 Other cervical disc displacement at C5-C6 level: Secondary | ICD-10-CM | POA: Diagnosis not present

## 2020-09-29 DIAGNOSIS — M50221 Other cervical disc displacement at C4-C5 level: Secondary | ICD-10-CM | POA: Diagnosis not present

## 2020-09-30 ENCOUNTER — Telehealth: Payer: Self-pay | Admitting: Family Medicine

## 2020-09-30 NOTE — Telephone Encounter (Signed)
Patient called stating that she is still having lots of pain. She has tried everything she can and has had not improvement. She asked if there was any other options to help with her pain?

## 2020-09-30 NOTE — Progress Notes (Signed)
Cervical spine MRI shows some mild disc bulging which does not impact any nerves or the spinal cord.  It seems as though your arm symptoms are probably not coming from your neck.  Lets proceed with the nerve conduction study already ordered.  That hopefully will give [place] more answers.

## 2020-10-01 NOTE — Telephone Encounter (Signed)
Left pt a VM inquiring about gabapentin. If pt calls back, please ask if she's tried taking the Gabapentin please.

## 2020-10-01 NOTE — Telephone Encounter (Signed)
Pt returned call. Has taken gabapentin as instructed at bedtime for 2 nights. Also took Naproxen and Aleve today, wearing brace and using Tiger Balm. Still very uncomfortable, pt is a Interior and spatial designer.

## 2020-10-01 NOTE — Progress Notes (Signed)
Pt viewed Dr. Zollie Pee result note via MyChart. Left pt a VM as well to proceed w/ NCV study and inquire about rx.

## 2020-10-01 NOTE — Telephone Encounter (Signed)
Did you try the gabapentin?

## 2020-10-02 MED ORDER — PREGABALIN 75 MG PO CAPS: 75.0000 mg | ORAL_CAPSULE | Freq: Two times a day (BID) | ORAL | 3 refills | Status: DC | PRN

## 2020-10-02 NOTE — Telephone Encounter (Signed)
Called pt and LM relaying Dr. Zollie Pee advice.  Asked her to return call, letting us know if she's heard from neurology regarding scheduling her NCV/EMG.

## 2020-10-02 NOTE — Addendum Note (Signed)
Addended by: Rodolph Bong on: 10/02/2020 07:10 AM   Modules accepted: Orders

## 2020-10-02 NOTE — Telephone Encounter (Signed)
Stop gabapentin and start Lyrica.  Lyrica prescribed to [facility] on [address].  Additionally next diagnostic step is nerve conduction study.  Has neurology called you to schedule the nerve conduction study yet?

## 2020-10-11 ENCOUNTER — Other Ambulatory Visit: Payer: Self-pay | Admitting: Internal Medicine

## 2020-10-11 DIAGNOSIS — E785 Hyperlipidemia, unspecified: Secondary | ICD-10-CM

## 2020-10-11 DIAGNOSIS — E119 Type 2 diabetes mellitus without complications: Secondary | ICD-10-CM

## 2020-10-11 DIAGNOSIS — E1169 Type 2 diabetes mellitus with other specified complication: Secondary | ICD-10-CM

## 2020-10-28 ENCOUNTER — Other Ambulatory Visit: Payer: Self-pay

## 2020-10-28 ENCOUNTER — Ambulatory Visit
Admission: RE | Admit: 2020-10-28 | Discharge: 2020-10-28 | Disposition: A | Discharge status: Home / Self Care | Payer: Medicaid Other | Source: Ambulatory Visit | Attending: Internal Medicine | Admitting: Internal Medicine

## 2020-10-28 DIAGNOSIS — Z1231 Encounter for screening mammogram for malignant neoplasm of breast: Secondary | ICD-10-CM | POA: Diagnosis not present

## 2020-10-28 DIAGNOSIS — Z1239 Encounter for other screening for malignant neoplasm of breast: Secondary | ICD-10-CM

## 2020-11-11 ENCOUNTER — Telehealth: Payer: Self-pay | Admitting: Internal Medicine

## 2020-11-11 NOTE — Telephone Encounter (Signed)
Patient tested positive for COVID on 11/01/2020 with a home test and stayed out of work for the 10 day quarantine process and now need a note to return to work.  CB- [phone]

## 2020-11-12 NOTE — Telephone Encounter (Signed)
Spoke with patient and a virtual visit scheduled  

## 2020-11-13 ENCOUNTER — Telehealth (INDEPENDENT_AMBULATORY_CARE_PROVIDER_SITE_OTHER): Payer: Medicaid Other | Admitting: Internal Medicine

## 2020-11-13 ENCOUNTER — Other Ambulatory Visit: Payer: Self-pay

## 2020-11-13 ENCOUNTER — Encounter: Payer: Self-pay | Admitting: Internal Medicine

## 2020-11-13 VITALS — Wt 265.0 lb

## 2020-11-13 DIAGNOSIS — U071 COVID-19: Secondary | ICD-10-CM

## 2020-11-13 NOTE — Progress Notes (Signed)
Virtual Visit via Video Note  I connected with Angelica Berg on 11/13/20 at  8:30 AM EDT by a video enabled telemedicine application and verified that I am speaking with the correct person using two identifiers.  Location patient: home Location provider: work office Persons participating in the virtual visit: patient, provider  I discussed the limitations of evaluation and management by telemedicine and the availability of in person appointments. The patient expressed understanding and agreed to proceed.   HPI: She tested positive for COVID on July 15 after having chills and body aches.  She is quarantined at home for 10 days.  She did not seek medical attention or notify me of her positive test.  She has declined COVID immunization.  She is requesting a note to be able to go back to work that is being requested of her.  She still has a little fatigue and some brain fog.   ROS: Constitutional: Denies fever, chills, diaphoresis, appetite change. HEENT: Denies photophobia, eye pain, redness, hearing loss, ear pain, congestion, sore throat, rhinorrhea, sneezing, mouth sores, trouble swallowing, neck pain, neck stiffness and tinnitus.   Respiratory: Denies SOB, DOE, cough, chest tightness,  and wheezing.   Cardiovascular: Denies chest pain, palpitations and leg swelling.  Gastrointestinal: Denies nausea, vomiting, abdominal pain, diarrhea, constipation, blood in stool and abdominal distention.  Genitourinary: Denies dysuria, urgency, frequency, hematuria, flank pain and difficulty urinating.  Endocrine: Denies: hot or cold intolerance, sweats, changes in hair or nails, polyuria, polydipsia. Musculoskeletal: Denies myalgias, back pain, joint swelling, arthralgias and gait problem.  Skin: Denies pallor, rash and wound.  Neurological: Denies dizziness, seizures, syncope, weakness, light-headedness, numbness and headaches.  Hematological: Denies adenopathy. Easy bruising, personal or  family bleeding history  Psychiatric/Behavioral: Denies suicidal ideation, mood changes, confusion, nervousness, sleep disturbance and agitation   Past Medical History:  Diagnosis Date   Diabetes mellitus ([place])    Dyslipidemia     Past Surgical History:  Procedure Laterality Date   CESAREAN SECTION     KNEE ARTHROCENTESIS      Family History  Problem Relation Age of Onset   Diabetes Mother    Hypertension Mother    Cancer Maternal Grandmother        throat   Heart disease Maternal Grandmother     SOCIAL HX:   reports that she quit smoking about 2 years ago. Her smoking use included cigarettes. She has never used smokeless tobacco. She reports previous alcohol use. She reports current drug use. Frequency: 7.00 times per week. Drug: Marijuana.   Current Outpatient Medications:    atorvastatin (LIPITOR) 80 MG tablet, TAKE ONE TABLET BY MOUTH DAILY, Disp: 90 tablet, Rfl: 1   Blood Glucose Monitoring Suppl (ACCU-CHEK GUIDE) w/Device KIT, 1 Device by Does not apply route daily. E11.9, Disp: 1 kit, Rfl: 0   Blood Glucose Monitoring Suppl (ONETOUCH VERIO) w/Device KIT, 1 Device by Does not apply route as directed., Disp: 1 kit, Rfl: 0   Dulaglutide (TRULICITY) 0.96 EA/5.4UJ SOPN, Inject 0.75 mg into the skin once a week., Disp: 2 mL, Rfl: 2   ergocalciferol (VITAMIN D2) 1.25 MG (50000 UT) capsule, Take 1 capsule (50,000 Units total) by mouth once a week., Disp: 12 capsule, Rfl: 0   glucose blood (ACCU-CHEK GUIDE) test strip, Use as instructed to test blood sugar 2 times daily E11.9, Disp: 100 each, Rfl: 12   ibuprofen (ADVIL) 800 MG tablet, Take 1 tablet (800 mg total) by mouth every 8 (eight) hours as  needed., Disp: 90 tablet, Rfl: 2   lisinopril (ZESTRIL) 5 MG tablet, TAKE ONE TABLET BY MOUTH DAILY, Disp: 90 tablet, Rfl: 1   metFORMIN (GLUCOPHAGE) 1000 MG tablet, Take 1 tablet (1,000 mg total) by mouth at bedtime., Disp: 180 tablet, Rfl: 1   pregabalin (LYRICA) 75 MG capsule, Take 1  capsule (75 mg total) by mouth 2 (two) times daily as needed (nerve pain)., Disp: 60 capsule, Rfl: 3  EXAM:   VITALS per patient if applicable: None reported  GENERAL: alert, oriented, appears well and in no acute distress  HEENT: atraumatic, conjunttiva clear, no obvious abnormalities on inspection of external nose and ears  NECK: normal movements of the head and neck  LUNGS: on inspection no signs of respiratory distress, breathing rate appears normal, no obvious gross increased work of breathing, gasping or wheezing  CV: no obvious cyanosis  MS: moves all visible extremities without noticeable abnormality  PSYCH/NEURO: pleasant and cooperative, no obvious depression or anxiety, speech and thought processing grossly intact  ASSESSMENT AND PLAN:   COVID-19  -She has passed her quarantine period, So I believe it is safe for her to go back to work effective immediately, letter will be sent via MyChart reflecting this.   I discussed the assessment and treatment plan with the patient. The patient was provided an opportunity to ask questions and all were answered. The patient agreed with the plan and demonstrated an understanding of the instructions.   The patient was advised to call back or seek an in-person evaluation if the symptoms worsen or if the condition fails to improve as anticipated.    Lelon Frohlich, MD  [facility] at [facility]

## 2020-11-20 ENCOUNTER — Ambulatory Visit (INDEPENDENT_AMBULATORY_CARE_PROVIDER_SITE_OTHER): Payer: Medicaid Other | Admitting: Neurology

## 2020-11-20 ENCOUNTER — Other Ambulatory Visit: Payer: Self-pay

## 2020-11-20 DIAGNOSIS — G8929 Other chronic pain: Secondary | ICD-10-CM

## 2020-11-20 DIAGNOSIS — G5601 Carpal tunnel syndrome, right upper limb: Secondary | ICD-10-CM

## 2020-11-20 DIAGNOSIS — M25531 Pain in right wrist: Secondary | ICD-10-CM | POA: Diagnosis not present

## 2020-11-20 DIAGNOSIS — G5621 Lesion of ulnar nerve, right upper limb: Secondary | ICD-10-CM

## 2020-11-20 NOTE — Procedures (Signed)
[facility]  [address], [address]  [place], [place] [address] Tel: [phone] Fax:  [phone] Test Date:  11/20/2020  Patient: Angelica Berg DOB: Jan 07, 1976 Physician: Nita Sickle, DO  Sex: Female Height: 5\' 10"  Ref Phys: , M.D.  ID#: [name]   Technician:    Patient Complaints: This is a 45 year old female referred for evaluation of right hand and arm paresthesias.  NCV & EMG Findings: Extensive electrodiagnostic testing of the right upper extremity shows:  Right median sensory response shows prolonged latency (4.1 ms) and reduced amplitude (15.2 V).  Right ulnar and radial sensory responses are within normal limits.   Right median and ulnar motor responses are within normal limits.   There is no evidence of active or chronic motor axonal loss changes affecting any of the tested muscles.  Motor unit configuration and recruitment pattern is within normal limits.    Impression: Right median neuropathy at or distal to the wrist, consistent with a clinical diagnosis of carpal tunnel syndrome.  Overall, these findings are moderate in degree electrically.   ___________________________ 57, DO    Nerve Conduction Studies Anti Sensory Summary Table   Stim Site NR Peak (ms) Norm Peak (ms) P-T Amp (V) Norm P-T Amp  Right Median Anti Sensory (2nd Digit)  35C  Wrist    4.1 <3.4 15.2 >20  Right Radial Anti Sensory (Base 1st Digit)  35C  Wrist    2.0 <2.7 28.3 >18  Right Ulnar Anti Sensory (5th Digit)  35C  Wrist    2.7 <3.1 32.3 >12   Motor Summary Table   Stim Site NR Onset (ms) Norm Onset (ms) O-P Amp (mV) Norm O-P Amp Site1 Site2 Delta-0 (ms) Dist (cm) Vel (m/s) Norm Vel (m/s)  Right Median Motor (Abd Poll Brev)  35C  Wrist    3.5 <3.9 11.4 >6 Elbow Wrist 5.6 30.0 54 >50  Elbow    9.1  10.7         Right Ulnar Motor (Abd Dig Minimi)  35C  Wrist    2.1 <3.1 9.5 >7 B Elbow Wrist 4.0 24.0 60 >50  B Elbow    6.1  8.8  A Elbow B  Elbow 1.9 10.0 53 >50  A Elbow    8.0  8.4          EMG   Side Muscle Ins Act Fibs Psw Fasc Number Recrt Dur Dur. Amp Amp. Poly Poly. Comment  Right 1stDorInt Nml Nml Nml Nml Nml Nml Nml Nml Nml Nml Nml Nml N/A  Right Abd Poll Brev Nml Nml Nml Nml Nml Nml Nml Nml Nml Nml Nml Nml N/A  Right PronatorTeres Nml Nml Nml Nml Nml Nml Nml Nml Nml Nml Nml Nml N/A  Right Biceps Nml Nml Nml Nml Nml Nml Nml Nml Nml Nml Nml Nml N/A  Right Triceps Nml Nml Nml Nml Nml Nml Nml Nml Nml Nml Nml Nml N/A  Right Deltoid Nml Nml Nml Nml Nml Nml Nml Nml Nml Nml Nml Nml N/A      Waveforms:

## 2020-11-21 ENCOUNTER — Encounter: Payer: Self-pay | Admitting: Internal Medicine

## 2020-11-21 ENCOUNTER — Ambulatory Visit (INDEPENDENT_AMBULATORY_CARE_PROVIDER_SITE_OTHER): Payer: Medicaid Other | Admitting: Internal Medicine

## 2020-11-21 VITALS — BP 120/84 | HR 91 | Temp 98.2°F | Wt 255.9 lb

## 2020-11-21 DIAGNOSIS — E1169 Type 2 diabetes mellitus with other specified complication: Secondary | ICD-10-CM | POA: Diagnosis not present

## 2020-11-21 DIAGNOSIS — E1121 Type 2 diabetes mellitus with diabetic nephropathy: Secondary | ICD-10-CM | POA: Diagnosis not present

## 2020-11-21 DIAGNOSIS — E785 Hyperlipidemia, unspecified: Secondary | ICD-10-CM | POA: Diagnosis not present

## 2020-11-21 LAB — POCT GLYCOSYLATED HEMOGLOBIN (HGB A1C): Hemoglobin A1C: 9.6 % — AB (ref 4.0–5.6)

## 2020-11-21 NOTE — Progress Notes (Signed)
Nerve conduction study shows medium carpal tunnel syndrome.  Recommend return to clinic to discuss results and discuss treatment plan and options going further.

## 2020-11-21 NOTE — Progress Notes (Signed)
Established Patient Office Visit     This visit occurred during the SARS-CoV-2 public health emergency.  Safety protocols were in place, including screening questions prior to the visit, additional usage of staff PPE, and extensive cleaning of exam room while observing appropriate contact time as indicated for disinfecting solutions.    CC/Reason for Visit: 37-monthfollow-up chronic medical conditions  HPI: Angelica CENAis a 45y.o. female who is coming in today for the above mentioned reasons. Past Medical History is significant for: Morbid obesity, type 2 diabetes that has not been well controlled, hypertension, hyperlipidemia associated with diabetes.  At last visit we added Trulicity to her metformin.  She admits to not doing it as she was afraid with needles despite in office teaching.  She has no concerns or complaints.  She did test positive for COVID in mid July.   Past Medical/Surgical History: Past Medical History:  Diagnosis Date   Diabetes mellitus ([place]    Dyslipidemia     Past Surgical History:  Procedure Laterality Date   CESAREAN SECTION     KNEE ARTHROCENTESIS      Social History:  reports that she quit smoking about 2 years ago. Her smoking use included cigarettes. She has never used smokeless tobacco. She reports previous alcohol use. She reports current drug use. Frequency: 7.00 times per week. Drug: Marijuana.  Allergies: No Known Allergies  Family History:  Family History  Problem Relation Age of Onset   Diabetes Mother    Hypertension Mother    Cancer Maternal Grandmother        throat   Heart disease Maternal Grandmother      Current Outpatient Medications:    atorvastatin (LIPITOR) 80 MG tablet, TAKE ONE TABLET BY MOUTH DAILY, Disp: 90 tablet, Rfl: 1   Blood Glucose Monitoring Suppl (ACCU-CHEK GUIDE) w/Device KIT, 1 Device by Does not apply route daily. E11.9, Disp: 1 kit, Rfl: 0   Blood Glucose Monitoring Suppl (ONETOUCH VERIO)  w/Device KIT, 1 Device by Does not apply route as directed., Disp: 1 kit, Rfl: 0   ergocalciferol (VITAMIN D2) 1.25 MG (50000 UT) capsule, Take 1 capsule (50,000 Units total) by mouth once a week., Disp: 12 capsule, Rfl: 0   glucose blood (ACCU-CHEK GUIDE) test strip, Use as instructed to test blood sugar 2 times daily E11.9, Disp: 100 each, Rfl: 12   ibuprofen (ADVIL) 800 MG tablet, Take 1 tablet (800 mg total) by mouth every 8 (eight) hours as needed., Disp: 90 tablet, Rfl: 2   lisinopril (ZESTRIL) 5 MG tablet, TAKE ONE TABLET BY MOUTH DAILY, Disp: 90 tablet, Rfl: 1   metFORMIN (GLUCOPHAGE) 1000 MG tablet, Take 1 tablet (1,000 mg total) by mouth at bedtime., Disp: 180 tablet, Rfl: 1   pregabalin (LYRICA) 75 MG capsule, Take 1 capsule (75 mg total) by mouth 2 (two) times daily as needed (nerve pain)., Disp: 60 capsule, Rfl: 3  Review of Systems:  Constitutional: Denies fever, chills, diaphoresis, appetite change and fatigue.  HEENT: Denies photophobia, eye pain, redness, hearing loss, ear pain, congestion, sore throat, rhinorrhea, sneezing, mouth sores, trouble swallowing, neck pain, neck stiffness and tinnitus.   Respiratory: Denies SOB, DOE, cough, chest tightness,  and wheezing.   Cardiovascular: Denies chest pain, palpitations and leg swelling.  Gastrointestinal: Denies nausea, vomiting, abdominal pain, diarrhea, constipation, blood in stool and abdominal distention.  Genitourinary: Denies dysuria, urgency, frequency, hematuria, flank pain and difficulty urinating.  Endocrine: Denies: hot or cold intolerance, sweats,  changes in hair or nails, polyuria, polydipsia. Musculoskeletal: Denies myalgias, back pain, joint swelling, arthralgias and gait problem.  Skin: Denies pallor, rash and wound.  Neurological: Denies dizziness, seizures, syncope, weakness, light-headedness, numbness and headaches.  Hematological: Denies adenopathy. Easy bruising, personal or family bleeding history   Psychiatric/Behavioral: Denies suicidal ideation, mood changes, confusion, nervousness, sleep disturbance and agitation    Physical Exam: Vitals:   11/21/20 0905  BP: 120/84  Pulse: 91  Temp: 98.2 F (36.8 C)  TempSrc: Oral  SpO2: 98%  Weight: 255 lb 14.4 oz (116.1 kg)    Body mass index is 36.72 kg/m.   Constitutional: NAD, calm, comfortable Eyes: PERRL, lids and conjunctivae normal, wears corrective lenses ENMT: Mucous membranes are moist.  Respiratory: clear to auscultation bilaterally, no wheezing, no crackles. Normal respiratory effort. No accessory muscle use.  Cardiovascular: Regular rate and rhythm, no murmurs / rubs / gallops. No extremity edema.  Neurologic: Grossly intact and nonfocal Psychiatric: Normal judgment and insight. Alert and oriented x 3. Normal mood.    Impression and Plan:  Type 2 diabetes mellitus with diabetic nephropathy, without long-term current use of insulin (H[facility])  -Not well controlled with an in office A1c of 9.6 today not surprisingly as she has not been taking her Trulicity. -We have again done in office teaching with a sample pen.  She seems proficient in doing injection. -Follow-up in 3 months.  Morbid obesity ([place]) -Discussed healthy lifestyle, including increased physical activity and better food choices to promote weight loss.  Hyperlipidemia associated with type 2 diabetes mellitus ([place]) -Well-controlled, lipid panel in May with a total cholesterol of 113, triglycerides 80 and LDL 52, she is on daily atorvastatin therapy.  Time spent: 31 minutes reviewing chart, interviewing and examining patient and formulating plan of care.     Lelon Frohlich, MD [facility] at [facility]

## 2020-11-26 NOTE — Progress Notes (Signed)
I, Christoper Fabian, LAT, ATC, am serving as scribe for Dr. [name].  Angelica Berg is a 45 y.o. female who presents to [facility] at [facility] today for f/u of R hand/wrist pain and R UE NCV/EMG and c-spine MRI review.  She was last seen by Dr. Denyse Amass on 09/23/20 and reported wearing a wrist brace almost constantly.  She located her pain to her R hand MCP joints 3-5 that radiates into her R ulnar wrist and forearm.  She also has numbness/tingling into her R 3rd and 4th fingers.  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  Today, pt reports no change in her symptoms and is interested to know what the next steps in her treatment will be.  Diagnostic testing: R UE NCV/EMG- 11/20/20; C-spine MRI- 09/29/20; C-spine XR- 09/23/20  Pertinent review of systems: No fevers or chills  Relevant historical information: Diabetes   Exam:  BP 110/80 (BP Location: Left Arm, Patient Position: Sitting, Cuff Size: Large)   Pulse 90   Ht 5\' 10"  (1.778 m)   Wt 257 lb (116.6 kg)   SpO2 98%   BMI 36.88 kg/m  General: Well Developed, well nourished, and in no acute distress.   MSK: Right elbow normal-appearing normal motion tender palpation lateral epicondyle normal elbow strength.  Some pain with resisted wrist and finger extension. Right hand and wrist normal-appearing normal motion mildly tender palpation dorsal ulnar hand.  Some pain with resisted wrist and finger extension.    Lab and Radiology Results  EXAM: MRI CERVICAL SPINE WITHOUT CONTRAST   TECHNIQUE: Multiplanar, multisequence MR imaging of the cervical spine was performed. No intravenous contrast was administered.   COMPARISON:  Prior radiograph from 09/23/2020.   FINDINGS: Alignment: Straightening with mild reversal of the normal cervical lordosis, apex at C4-5. No listhesis.   Vertebrae: Vertebral body height well maintained without acute or chronic fracture. Bone marrow signal intensity within normal limits. No  discrete or worrisome osseous lesions. No abnormal marrow edema.   Cord: Normal signal and morphology.   Posterior Fossa, vertebral arteries, paraspinal tissues: Empty sella noted. Visualized brain and posterior fossa otherwise unremarkable. Craniocervical junction within normal limits. Paraspinous and prevertebral soft tissues within normal limits. Normal flow voids seen within the vertebral arteries bilaterally.   Disc levels:   C2-C3: Unremarkable.   C3-C4:  Unremarkable.   C4-C5: Tiny central disc protrusion minimally indents the ventral thecal sac. No spinal stenosis or cord impingement. Foramina remain patent.   C5-C6: Minimal annular disc bulge. No spinal stenosis. Foramina remain patent.   C6-C7: Minimal annular disc bulge. No canal or foraminal stenosis. No impingement.   C7-T1:  Unremarkable.   Visualized upper thoracic spine demonstrates no significant finding.   IMPRESSION: 1. Minimal noncompressive disc bulging at C4-5 through C6-7 without stenosis or neural impingement. 2. Otherwise unremarkable and normal MRI of the cervical spine. 3. Empty sella. While this finding is often incidental in nature and of no clinical significance, this can also be seen in the setting of idiopathic intracranial hypertension.     Electronically Signed   By: 11/23/2020 M.D.   On: 09/29/2020 20:15 I, 11/29/2020, personally (independently) visualized and performed the interpretation of the images attached in this note.  Nerve conduction study November 20, 2020 Patient Complaints: This is a 45 year old female referred for evaluation of right hand and arm paresthesias.   NCV & EMG Findings: Extensive electrodiagnostic testing of the right upper extremity shows: Right median  sensory response shows prolonged latency (4.1 ms) and reduced amplitude (15.2 V).  Right ulnar and radial sensory responses are within normal limits.   Right median and ulnar motor responses are  within normal limits.   There is no evidence of active or chronic motor axonal loss changes affecting any of the tested muscles.  Motor unit configuration and recruitment pattern is within normal limits.     Impression: Right median neuropathy at or distal to the wrist, consistent with a clinical diagnosis of carpal tunnel syndrome.  Overall, these findings are moderate in degree electrically.       Assessment and Plan: 45 y.o. female with right dorsal hand and wrist pain.  Originally this pain was described more as a paresthesia and she has been thoroughly worked up for potential cervical radiculopathy and peripheral nerve paresthesia or injury.  Cervical spine MRI was not very helpful but the nerve conduction study performed relatively recently shows medium carpal tunnel syndrome but no problem with the ulnar nerve.  This does not correspond to her current pain which is more dorsal ulnar related.  I think the carpal tunnel syndrome that she probably does have is probably not much of a factor in her current symptoms now.  Dominant issue is extensor tendinitis especially in the more ulnar aspect of her hand along with some lateral epicondylitis.  Plan for home exercise program and referral to Occupational Therapy.  Recheck in a month.  We could proceed with injection as needed.   PDMP not reviewed this encounter. Orders Placed This Encounter  Procedures   Ambulatory referral to Occupational Therapy    Referral Priority:   Routine    Referral Type:   Occupational Therapy    Referral Reason:   Specialty Services Required    Requested Specialty:   Occupational Therapy    Number of Visits Requested:   1   No orders of the defined types were placed in this encounter.    Discussed warning signs or symptoms. Please see discharge instructions. Patient expresses understanding.   The above documentation has been reviewed and is accurate and complete [name], M.D.  Total encounter time 20  minutes including face-to-face time with the patient and, reviewing past medical record, and charting on the date of service.   Reviewed MRI findings, nerve conduction study, discussed treatment plan and options

## 2020-11-27 ENCOUNTER — Ambulatory Visit (INDEPENDENT_AMBULATORY_CARE_PROVIDER_SITE_OTHER): Payer: Medicaid Other | Admitting: Family Medicine

## 2020-11-27 ENCOUNTER — Other Ambulatory Visit: Payer: Self-pay

## 2020-11-27 ENCOUNTER — Encounter: Payer: Self-pay | Admitting: Family Medicine

## 2020-11-27 VITALS — BP 110/80 | HR 90 | Ht 70.0 in | Wt 257.0 lb

## 2020-11-27 DIAGNOSIS — M79641 Pain in right hand: Secondary | ICD-10-CM

## 2020-11-27 DIAGNOSIS — M7711 Lateral epicondylitis, right elbow: Secondary | ICD-10-CM

## 2020-11-27 DIAGNOSIS — G8929 Other chronic pain: Secondary | ICD-10-CM

## 2020-11-27 DIAGNOSIS — M25531 Pain in right wrist: Secondary | ICD-10-CM | POA: Diagnosis not present

## 2020-11-27 NOTE — Patient Instructions (Signed)
Thank you for coming in today.   Ok to look up the exercises yourself.   Look up tennis elbow and hand PT exercises.   Recheck in 1 month if not improving.   I can do more including injection if needed.   I think the problem is tendonitis in the wrist and elbow.    Tennis Elbow Rehab Ask your health care provider which exercises are safe for you. Do exercises exactly as told by your health care provider and adjust them as directed. It is normal to feel mild stretching, pulling, tightness, or discomfort as you do these exercises. Stop right away if you feel sudden pain or your pain gets worse. Do not begin these exercises until told by your health care provider. Stretching and range-of-motion exercises These exercises warm up your muscles and joints and improve the movement andflexibility of your elbow. Wrist flexion, assisted  Straighten your left / right elbow in front of you with your palm facing down toward the floor. If told by your health care provider, bend your left / right elbow to a 90-degree angle (right angle) at your side instead of holding it straight. With your other hand, gently push over the back of your left / right hand so your fingers point toward the floor (flexion). Stop when you feel a gentle stretch on the back of your forearm. Hold this position for __________ seconds. Repeat __________ times. Complete this exercise __________ times a day. Wrist extension, assisted  Straighten your left / right elbow in front of you with your palm facing up toward the ceiling. If told by your health care provider, bend your left / right elbow to a 90-degree angle (right angle) at your side instead of holding it straight. With your other hand, gently pull your left / right hand and fingers toward the floor (extension). Stop when you feel a gentle stretch on the palm side of your forearm. Hold this position for __________ seconds. Repeat __________ times. Complete this exercise  __________ times a day. Assisted forearm rotation, supination Sit or stand with your elbows at your side. Bend your left / right elbow to a 90-degree angle (right angle). Using your uninjured hand, turn your left / right palm up toward the ceiling (supination) until you feel a gentle stretch along the inside of your forearm. Hold this position for __________ seconds. Repeat __________ times. Complete this exercise __________ times a day. Assisted forearm rotation, pronation Sit or stand with your elbows at your side. Bend your left / right elbow to a 90-degree angle (right angle). Using your uninjured hand, turn your left / right palm down toward the floor (pronation) until you feel a gentle stretch along the outside of your forearm. Hold this position for __________ seconds. Repeat __________ times. Complete this exercise __________ times a day. Strengthening exercises These exercises build strength and endurance in your forearm and elbow. Endurance is the ability to use your muscles for a long time, even after theyget tired. Radial deviation  Stand with a __________ weight or a hammer in your left / right hand. Or, sit while holding a rubber exercise band or tubing, with your left / right forearm supported on a table or countertop. Position your forearm so that the thumb is facing the ceiling, as if you are going to clap your hands. This is the neutral position. Raise your hand upward in front of you so your thumb moves toward the ceiling (radial deviation), or pull up on the rubber  tubing. Keep your forearm and elbow still while you move your wrist only. Hold this position for __________ seconds. Slowly return to the starting position. Repeat __________ times. Complete this exercise __________ times a day. Wrist extension, eccentric Sit with your left / right forearm palm-down and supported on a table or other surface. Let your left / right wrist extend over the edge of the surface. Hold  a __________ weight or a piece of exercise band or tubing in your left / right hand. If using a rubber exercise band or tubing, hold the other end of the tubing with your other hand. Use your uninjured hand to move your left / right hand up toward the ceiling. Take your uninjured hand away and slowly return to the starting position using only your left / right hand. Lowering your arm under tension is called eccentric extension. Repeat __________ times. Complete this exercise __________ times a day. Wrist extension Do not do this exercise if it causes pain at the outside of your elbow. Only do this exercise once instructed by your health care provider. Sit with your left / right forearm supported on a table or other surface and your palm turned down toward the floor. Let your left / right wrist extend over the edge of the surface. Hold a __________ weight or a piece of rubber exercise band or tubing. If you are using a rubber exercise band or tubing, hold the band or tubing in place with your other hand to provide resistance. Slowly bend your wrist so your hand moves up toward the ceiling (extension). Move only your wrist, keeping your forearm and elbow still. Hold this position for __________ seconds. Slowly return to the starting position. Repeat __________ times. Complete this exercise __________ times a day. Forearm rotation, supination To do this exercise, you will need a lightweight hammer or rubber mallet. Sit with your left / right forearm supported on a table or other surface. Bend your elbow to a 90-degree angle (right angle). Position your forearm so that your palm is facing down toward the floor, with your hand resting over the edge of the table. Hold a hammer in your left / right hand. To make this exercise easier, hold the hammer near the head of the hammer. To make this exercise harder, hold the hammer near the end of the handle. Without moving your wrist or elbow, slowly rotate  your forearm so your palm faces up toward the ceiling (supination). Hold this position for __________ seconds. Slowly return to the starting position. Repeat __________ times. Complete this exercise __________ times a day. Shoulder blade squeeze Sit in a stable chair or stand with good posture. If you are sitting down, do not let your back touch the back of the chair. Your arms should be at your sides with your elbows bent to a 90-degree angle (right angle). Position your forearms so that your thumbs are facing the ceiling (neutral position). Without lifting your shoulders up, squeeze your shoulder blades tightly together. Hold this position for __________ seconds. Slowly release and return to the starting position. Repeat __________ times. Complete this exercise __________ times a day. This information is not intended to replace advice given to you by your health care provider. Make sure you discuss any questions you have with your healthcare provider. Document Revised: 06/28/2019 Document Reviewed: 06/28/2019 Elsevier Patient Education  2022 [employer].

## 2020-12-05 LAB — HM DIABETES EYE EXAM

## 2020-12-12 ENCOUNTER — Encounter: Payer: Self-pay | Admitting: Internal Medicine

## 2021-01-03 ENCOUNTER — Encounter: Payer: Self-pay | Admitting: Internal Medicine

## 2021-01-09 ENCOUNTER — Telehealth: Payer: Self-pay | Admitting: Family Medicine

## 2021-01-09 NOTE — Telephone Encounter (Signed)
Patient called and was flustered by our return call, states she has made her Neuro appt but they cannot see her until the beginning of the year. I mentioned PT/OT referrals but she seems to be lost in the various referrals and unsure what is needed.  I scheduled pt for a visit on Monday based on her pain complaint.

## 2021-01-09 NOTE — Telephone Encounter (Signed)
Returned pt's call and LM.  She has been referred to PT and OT multiple times since May 2022 and has not attended any visits.  Provide her w/ phone # to [facility] [phone]) which is where she was most recently referred so that she can call and schedule w/ them.  Dr. Denyse Amass also mentioned option of an injection at her last visit so advise that she can schedule a f/u visit if she would like to go that route.

## 2021-01-09 NOTE — Telephone Encounter (Signed)
Patient called stating that the current medication she is on it not helping (she is taking two 800mg  ibuprofen).  Please advise on other options.

## 2021-01-10 NOTE — Progress Notes (Signed)
I, Christoper Fabian, LAT, ATC, am serving as scribe for Dr. [name].  Angelica Berg is a 45 y.o. female who presents to [facility] today for f/u of R hand/wrist pain and paresthesias in R fingers 3 and 4.  She was last seen by Dr. Denyse Amass on 11/27/20 to review the R UE NCV/EMG that she had on 11/20/20.  She was shown a HEP and referred to OT but has not completed any sessions.  She was previously referred to OT on 09/23/20 and to PT on 08/26/20 and has not had any visits.  Today, pt reports a few days ago experienced increased pain in her R hand/wrist. Pt locates pain to R 3-5 fingers along the ulnar aspect of wrist and radiates proximally to elbow w/ numbness/tingling present.  She notes the ibuprofen is not very effective and she is having to take 2 of the 800 mg ibuprofens at a time.  Diagnostic testing: R UE NCV/EMG- 11/20/20; C-spine MRI- 09/29/20; C-spine XR- 09/23/20  Pertinent review of systems: No fevers or chills   Relevant historical information: Hyperlipidemia and diabetes   Exam:  BP 140/90   Ht 5\' 10"  (1.778 m)   Wt 261 lb 6.4 oz (118.6 kg)   BMI 37.51 kg/m  General: Well Developed, well nourished, and in no acute distress.   MSK: Right hand normal-appearing Minimally tender palpation dorsal aspect hand.  Normal hand motion.    Lab and Radiology Results EXAM: MRI CERVICAL SPINE WITHOUT CONTRAST   TECHNIQUE: Multiplanar, multisequence MR imaging of the cervical spine was performed. No intravenous contrast was administered.   COMPARISON:  Prior radiograph from 09/23/2020.   FINDINGS: Alignment: Straightening with mild reversal of the normal cervical lordosis, apex at C4-5. No listhesis.   Vertebrae: Vertebral body height well maintained without acute or chronic fracture. Bone marrow signal intensity within normal limits. No discrete or worrisome osseous lesions. No abnormal marrow edema.   Cord: Normal signal and morphology.    Posterior Fossa, vertebral arteries, paraspinal tissues: Empty sella noted. Visualized brain and posterior fossa otherwise unremarkable. Craniocervical junction within normal limits. Paraspinous and prevertebral soft tissues within normal limits. Normal flow voids seen within the vertebral arteries bilaterally.   Disc levels:   C2-C3: Unremarkable.   C3-C4:  Unremarkable.   C4-C5: Tiny central disc protrusion minimally indents the ventral thecal sac. No spinal stenosis or cord impingement. Foramina remain patent.   C5-C6: Minimal annular disc bulge. No spinal stenosis. Foramina remain patent.   C6-C7: Minimal annular disc bulge. No canal or foraminal stenosis. No impingement.   C7-T1:  Unremarkable.   Visualized upper thoracic spine demonstrates no significant finding.   IMPRESSION: 1. Minimal noncompressive disc bulging at C4-5 through C6-7 without stenosis or neural impingement. 2. Otherwise unremarkable and normal MRI of the cervical spine. 3. Empty sella. While this finding is often incidental in nature and of no clinical significance, this can also be seen in the setting of idiopathic intracranial hypertension.     Electronically Signed   By: 11/23/2020 M.D.   On: 09/29/2020 20:15    EMG 11/20/20 Patient Complaints: This is a 45 year old female referred for evaluation of right hand and arm paresthesias.   NCV & EMG Findings: Extensive electrodiagnostic testing of the right upper extremity shows:  Right median sensory response shows prolonged latency (4.1 ms) and reduced amplitude (15.2 V).  Right ulnar and radial sensory responses are within normal limits.   Right median and  ulnar motor responses are within normal limits.   There is no evidence of active or chronic motor axonal loss changes affecting any of the tested muscles.  Motor unit configuration and recruitment pattern is within normal limits.     Impression: Right median neuropathy at or  distal to the wrist, consistent with a clinical diagnosis of carpal tunnel syndrome.  Overall, these findings are moderate in degree electrically.     Component     Latest Ref Rng & Units 08/26/2020  ANA Titer 1     titer 1:40 (H)  ANA Pattern 1      Nuclear, Speckled (A)  Cyclic Citrullin Peptide Ab     UNITS <16  CK Total     7 - 177 U/L 159  Uric Acid, Serum     2.4 - 7.0 mg/dL 4.7  Anti Nuclear Antibody (ANA)     NEGATIVE POSITIVE (A)  RA Latex Turbid.     <14 IU/mL <14  Sed Rate     0 - 20 mm/hr 9        Assessment and Plan: 45 y.o. female with right hand pain.  Pain is located on the dorsal aspect of the hand at digits 3 4 and 5 focused along the dorsal metacarpals and MCP.  She had an extensive work-up already including x-ray of the hand and wrist, rheumatologic work-up, cervical spine MRI, and an EMG all of which do not explain her current pain.  Current therapy is tendinopathy.  Planned at the last visit for hand PT however this was not done due to some communication issues between her and the physical therapy location.  Stressed the importance of physical therapy/OT.  Provided Ms. Almgren with the phone number to OT so that she can schedule it herself.  As for the ibuprofen we will discontinue ibuprofen and switch to meloxicam.  Cautioned against exceeding recommended/prescribed dose of NSAIDs as this is not safe.  Recheck in 6 weeks.  If not improved consider injection.  Of note I did offer injection is a possibility today patient declined.   PDMP not reviewed this encounter. No orders of the defined types were placed in this encounter.  Meds ordered this encounter  Medications   meloxicam (MOBIC) 15 MG tablet    Sig: Take 1 tablet (15 mg total) by mouth daily.    Dispense:  30 tablet    Refill:  3     Discussed warning signs or symptoms. Please see discharge instructions. Patient expresses understanding.   The above documentation has been reviewed and is  accurate and complete [name], M.D.

## 2021-01-13 ENCOUNTER — Other Ambulatory Visit: Payer: Self-pay

## 2021-01-13 ENCOUNTER — Ambulatory Visit (INDEPENDENT_AMBULATORY_CARE_PROVIDER_SITE_OTHER): Payer: Medicaid Other | Admitting: Family Medicine

## 2021-01-13 VITALS — BP 140/90 | Ht 70.0 in | Wt 261.4 lb

## 2021-01-13 DIAGNOSIS — M79641 Pain in right hand: Secondary | ICD-10-CM

## 2021-01-13 MED ORDER — MELOXICAM 15 MG PO TABS: 15.0000 mg | ORAL_TABLET | Freq: Every day | ORAL | 3 refills | Status: DC

## 2021-01-13 NOTE — Patient Instructions (Addendum)
Thank you for coming in today.   I've referred you to Physical Therapy.  Here is the number to [facility]: [phone] to schedule.  STOP ibuprofen.   START meloxicam   Please use Voltaren gel (Generic Diclofenac Gel) up to 4x daily for pain as needed.  This is available over-the-counter as both the name brand Voltaren gel and the generic diclofenac gel.   Ok to also use Tylenol.   Recheck in 6 weeks.

## 2021-01-20 ENCOUNTER — Ambulatory Visit: Payer: Medicaid Other | Attending: Family Medicine | Admitting: Occupational Therapy

## 2021-01-20 ENCOUNTER — Other Ambulatory Visit: Payer: Self-pay

## 2021-01-20 DIAGNOSIS — R208 Other disturbances of skin sensation: Secondary | ICD-10-CM | POA: Diagnosis present

## 2021-01-20 DIAGNOSIS — M79641 Pain in right hand: Secondary | ICD-10-CM | POA: Insufficient documentation

## 2021-01-20 DIAGNOSIS — M79621 Pain in right upper arm: Secondary | ICD-10-CM | POA: Diagnosis present

## 2021-01-20 DIAGNOSIS — M6281 Muscle weakness (generalized): Secondary | ICD-10-CM | POA: Insufficient documentation

## 2021-01-20 DIAGNOSIS — M25521 Pain in right elbow: Secondary | ICD-10-CM | POA: Insufficient documentation

## 2021-01-20 NOTE — Therapy (Signed)
[facility] [address] [place], [place], [address] Phone: [phone]   Fax:  [phone]  Occupational Therapy Evaluation  Patient Details  Name: Angelica Berg MRN: [id number] Date of Birth: [birth year] Referring Provider (OT): Dr. Denyse Amass   Encounter Date: 01/20/2021   OT End of Session - 01/20/21 1009     Visit Number 1    Number of Visits 17    Date for OT Re-Evaluation 03/22/21    Authorization Type [employer] MCD - 27 Visits, no auth required    OT Start Time 0930    OT Stop Time 1010    OT Time Calculation (min) 40 min    Activity Tolerance Patient tolerated treatment well    Behavior During Therapy [facility] for tasks assessed/performed             Past Medical History:  Diagnosis Date   Diabetes mellitus (HCC)    Dyslipidemia     Past Surgical History:  Procedure Laterality Date   CESAREAN SECTION     KNEE ARTHROCENTESIS      There were no vitals filed for this visit.   Subjective Assessment - 01/20/21 0934     Pertinent History Rt hand pain, lateral epicondylitis, T2DM    Currently in Pain? Yes    Pain Score 7     Pain Location Arm   last 3 digits at MP level (dorsal and volarly) along ulnar forearm and dorsal elbow up to neck   Pain Orientation Right    Pain Descriptors / Indicators Numbness;Aching    Pain Type Acute pain    Pain Onset More than a month ago    Pain Frequency Constant    Aggravating Factors  cutting hair, rain    Pain Relieving Factors Pain meds               [facility] OT Assessment - 01/20/21 0001       Assessment   Medical Diagnosis Rt hand pain, extensor tendonitis, lateral epicondylitis   MD notes also report right median neuropathy consistent with CTS but symptoms do not seem to align with CTS   Referring Provider (OT) Dr. Denyse Amass    Onset Date/Surgical Date --   approx. onset started in March 2022   Hand Dominance Right    Prior Therapy none      Precautions    Precautions None      Balance Screen   Has the patient fallen in the past 6 months No    Has the patient had a decrease in activity level because of a fear of falling?  No    Is the patient reluctant to leave their home because of a fear of falling?  No      Home  Environment   Additional Comments twin daughters 39 y.o.    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 Independent      IADL   Shopping Takes care of all shopping needs independently  Light Housekeeping --   independent   Meal Prep Plans, prepares and serves adequate meals independently   works through pain   Training and development officer own vehicle    Medication Management Is responsible for taking medication in correct dosages at correct time      Mobility   Mobility Status Comments independent      Written Expression   Dominant Hand Right    Handwriting --   denies change in handwriting but holds pen slightly differently     Vision - History   Baseline Vision Wears glasses all the time      Observation/Other Assessments   Observations pain not consistent with CTS, pt reports pain in MCP joints but also has pain radiating up to elbow ulnarly and dorsally      Sensation   Additional Comments Pt reports numbness/tingling intermittently last 3 digits (long, ring, and small fingers)      Coordination   9 Hole Peg Test Right;Left    Right 9 Hole Peg Test 23.66 sec    Left 9 Hole Peg Test 20.84 sec      Edema   Edema none observed during evaluation      ROM / Strength   AROM / PROM / Strength AROM       Palpation   Palpation comment point tender near wrist extensors attachment at lateral epicondyle      AROM   Overall AROM Comments BUE AROM WNL's including intrinsic movements of hand (no ulnar clawing)      Hand Function   Right Hand Grip (lbs) 25.5 lbs    Right Hand Lateral Pinch 11 lbs    Right Hand 3 Point Pinch 8.5 lbs    Left Hand Grip (lbs) 62.8 lbs    Left Hand Lateral Pinch 13.5 lbs    Left 3 point pinch 12 lbs                                OT Short Term Goals - 01/20/21 1121       OT SHORT TERM GOAL #1   Title Independent with HEP RUE    Baseline not yet issued    Time 4    Period Weeks    Status New      OT SHORT TERM GOAL #2   Title Pt to verbalize understanding with pain reduction strategies including: modalities, splinting, task modifications, and A/E prn    Baseline dependent - not yet educated in    Time 4    Period Weeks    Status New      OT SHORT TERM GOAL #3   Title Pt to report pain overall 4/10 or under with functional tasks    Baseline fluctuates, today 7/10    Time 4    Period Weeks    Status New               OT Long Term Goals - 01/20/21 1123       OT LONG TERM GOAL #1   Title Independent with updated HEP    Baseline Dependent    Time 8    Period Weeks    Status New      OT LONG TERM GOAL #2   Title Grip strength Rt hand to be 40 lbs or greater    Baseline 25.5 lbs    Time 8    Period Weeks    Status  New      OT LONG TERM GOAL #3   Title Pt to improve lateral and 3 tip pinch strength by 2 lbs each    Baseline lat = 11, 3 tip = 8.5 lbs    Time 8    Period Weeks    Status New                   Plan - 01/20/21 1011     Clinical Impression Statement Pt is a 45 y.o. female who presents to [facility] with diagnosis: Rt hand pain, extensor tendonitis, and lateral epicondylitis. Pt reports pain at MCP joints last 3 digits and ulnarly along wrist and forearm up to elbow, w/ new pain radiating up to  neck. Pt also with decreased strength RUE and parathesias in last 3 digits. Pt would benefit from O.T. to address these deficits and maximize function in daily tasks including work related tasks and caregiver tasks.    OT Occupational Profile and History Problem Focused Assessment - Including review of records relating to presenting problem    Occupational performance deficits (Please refer to evaluation for details): IADL's;ADL's;Work    Body Structure / Function / Physical Skills Strength;Dexterity;Pain;UE functional use;Body mechanics;IADL;Sensation;Coordination    Rehab Potential Good    Clinical Decision Making Several treatment options, min-mod task modification necessary    Comorbidities Affecting Occupational Performance: May have comorbidities impacting occupational performance    Modification or Assistance to Complete Evaluation  No modification of tasks or assist necessary to complete eval    OT Frequency 2x / week    OT Duration 8 weeks   may reduce down to 1x/wk when appropriate   OT Treatment/Interventions Self-care/ADL training;Moist Heat;Fluidtherapy;DME and/or AE instruction;Splinting;Therapeutic activities;Compression bandaging;Therapeutic exercise;Ultrasound;Coping strategies training;Iontophoresis;Cryotherapy;Passive range of motion;Electrical Stimulation;Paraffin;Manual Therapy;Patient/family education    Plan ionto if cleared by MD, stretches and massage education for lateral epicondylitis, ? elbow pad (following session: ionto, possible A/E needs including adapted scissors and/or modifications in positioning for work related tasks)    Consulted and Agree with Plan of Care Patient             Patient will benefit from skilled therapeutic intervention in order to improve the following deficits and impairments:   Body Structure / Function / Physical Skills: Strength, Dexterity, Pain, UE functional use, Body mechanics, IADL, Sensation, Coordination       Visit  Diagnosis: Pain in right hand  Pain in right elbow  Pain in right upper arm  Muscle weakness (generalized)  Other disturbances of skin sensation    Problem List Patient Active Problem List   Diagnosis Date Noted   Pain in left knee 05/23/2019   Morbid obesity (HCC) 08/30/2018   Vitamin D deficiency 08/30/2018   Hyperlipidemia associated with type 2 diabetes mellitus (HCC) 08/30/2018   Transaminitis 08/30/2018    Kelli Churn, OTR/L 01/20/2021, 11:27 AM  [facility] [address] [place], [place], [address] Phone: [phone]   Fax:  [phone]  Name: Angelica Berg MRN: [id number] Date of Birth: July 18, 1975

## 2021-02-03 ENCOUNTER — Ambulatory Visit: Payer: Medicaid Other | Admitting: Occupational Therapy

## 2021-02-03 ENCOUNTER — Other Ambulatory Visit: Payer: Self-pay

## 2021-02-03 DIAGNOSIS — M79621 Pain in right upper arm: Secondary | ICD-10-CM

## 2021-02-03 DIAGNOSIS — M79641 Pain in right hand: Secondary | ICD-10-CM
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
## 2021-02-05 ENCOUNTER — Ambulatory Visit: Payer: Medicaid Other | Admitting: Occupational Therapy

## 2021-02-10 ENCOUNTER — Ambulatory Visit: Payer: Medicaid Other | Admitting: Occupational Therapy

## 2021-02-12 ENCOUNTER — Ambulatory Visit: Payer: Medicaid Other | Admitting: Occupational Therapy

## 2021-02-13 ENCOUNTER — Telehealth: Payer: Self-pay | Admitting: Internal Medicine

## 2021-02-13 NOTE — Telephone Encounter (Signed)
..   Medicaid Managed Care   Unsuccessful Outreach Note  02/13/2021 Name: Angelica Berg MRN: [id number] DOB: [birth year]  Referred by: Philip Aspen, Limmie Patricia, MD Reason for referral : High Risk Managed Medicaid (I called the patient today to get her scheduled with the [facility] Team. I left my name and number on her VM.)   An unsuccessful telephone outreach was attempted today. The patient was referred to the case management team for assistance with care management and care coordination.   Follow Up Plan: The care management team will reach out to the patient again over the next 7-14 days.   Weston Settle Care Guide, High Risk Medicaid Managed Care Embedded Care Coordination [facility]  [facility]    .j

## 2021-02-17 ENCOUNTER — Ambulatory Visit: Payer: Medicaid Other | Admitting: Occupational Therapy

## 2021-02-19 ENCOUNTER — Ambulatory Visit: Payer: Medicaid Other | Admitting: Occupational Therapy

## 2021-02-19 ENCOUNTER — Ambulatory Visit: Payer: Medicaid Other | Attending: Family Medicine | Admitting: Occupational Therapy

## 2021-02-19 ENCOUNTER — Other Ambulatory Visit: Payer: Self-pay

## 2021-02-19 DIAGNOSIS — M25521 Pain in right elbow: Secondary | ICD-10-CM | POA: Insufficient documentation

## 2021-02-19 DIAGNOSIS — M79621 Pain in right upper arm: Secondary | ICD-10-CM | POA: Diagnosis not present

## 2021-02-19 DIAGNOSIS — M79641 Pain in right hand: Secondary | ICD-10-CM | POA: Insufficient documentation

## 2021-02-19 NOTE — Patient Instructions (Signed)
Lateral Flexion    With head in comfortable, centered position and chin slightly tucked, gently bring left ear toward left shoulder (keep Rt shoulder down). Hold __20__ seconds. Repeat with other side. Repeat __5__ times. Do _3___ sessions per day. (Can also take opposite hand and gently pull further and hold)

## 2021-02-19 NOTE — Therapy (Signed)
[facility] [address] [place], [place], [address] Phone: [phone]   Fax:  [phone]  Occupational Therapy Treatment  Patient Details  Name: Angelica Berg MRN: [id number] Date of Birth: [birth year] Referring Provider (OT): Dr. Denyse Amass   Encounter Date: 02/19/2021   OT End of Session - 02/19/21 1011     Visit Number 3    Number of Visits 17    Date for OT Re-Evaluation 03/22/21    Authorization Type UHC MCD - 27 Visits, no auth required    OT Start Time 0930    OT Stop Time 1010    OT Time Calculation (min) 40 min    Activity Tolerance Patient tolerated treatment well    Behavior During Therapy [facility] for tasks assessed/performed             Past Medical History:  Diagnosis Date   Diabetes mellitus (HCC)    Dyslipidemia     Past Surgical History:  Procedure Laterality Date   CESAREAN SECTION     KNEE ARTHROCENTESIS      There were no vitals filed for this visit.   Subjective Assessment - 02/19/21 0939     Subjective  Shoulder pain is worse (upper traps), and hand and elbow pain (RUE). I got the tennis elbow brace but I sometimes forget to wear it    Pertinent History Rt hand pain, lateral epicondylitis, T2DM    Currently in Pain? Yes    Pain Score 9     Pain Location Shoulder   7/10 for wrist and last 3 digits dorsally up to lateral epicondylitis   Pain Orientation Right    Pain Descriptors / Indicators Aching;Numbness    Pain Type Acute pain    Pain Onset More than a month ago    Pain Frequency Constant    Aggravating Factors  cutting hair, rain    Pain Relieving Factors pain meds             Discussed current POC and need for consistency in attendance to therapy in order to show improvement and for iontophoresis to be beneficial. Pt reports she is relying on others for transportation and having a hard time getting to therapy. Recommended placing on hold until pt's car repaired and she  can come consistently for max benefits - pt in agreement.  Reviewed brace wear and care and encouraged pt to wear tennis elbow brace more often (especially w/ work related tasks) and wrist brace prn.   Reviewed lateral epicondylitis program stages I and II and encouraged pt to continue. Pt shown upper traps stretch (cervical lateral flexion) due to pain Rt neck.   Kinesiotape applied to relax upper traps, and wrist extensor assist strip provided to help w/ wrist extension as well as relaxing proximally at wrist extensor attachments. Pt instructed in wear and care.                      OT Education - 02/19/21 1003     Education Details kinesiotape wear and care, cervical lateral flex stretch    Person(s) Educated Patient    Methods Explanation;Demonstration;Handout    Comprehension Verbalized understanding;Returned demonstration              OT Short Term Goals - 02/19/21 1013       OT SHORT TERM GOAL #1   Title Independent with HEP RUE    Baseline not yet issued    Time 4  Period Weeks    Status On-going      OT SHORT TERM GOAL #2   Title Pt to verbalize understanding with pain reduction strategies including: modalities, splinting, task modifications, and A/E prn    Baseline dependent - not yet educated in    Time 4    Period Weeks    Status On-going      OT SHORT TERM GOAL #3   Title Pt to report pain overall 4/10 or under with functional tasks    Baseline fluctuates, today 7/10    Time 4    Period Weeks    Status New               OT Long Term Goals - 01/20/21 1123       OT LONG TERM GOAL #1   Title Independent with updated HEP    Baseline Dependent    Time 8    Period Weeks    Status New      OT LONG TERM GOAL #2   Title Grip strength Rt hand to be 40 lbs or greater    Baseline 25.5 lbs    Time 8    Period Weeks    Status New      OT LONG TERM GOAL #3   Title Pt to improve lateral and 3 tip pinch strength by 2 lbs each     Baseline lat = 11, 3 tip = 8.5 lbs    Time 8    Period Weeks    Status New                   Plan - 02/19/21 1013     Clinical Impression Statement Pt has not been able to come consistently due to relying on others for transportation and other personal issues. Discussed placing on hold until pt can come consistently for iontophoresis treatments. Pt in agreement.    OT Occupational Profile and History Problem Focused Assessment - Including review of records relating to presenting problem    Occupational performance deficits (Please refer to evaluation for details): IADL's;ADL's;Work    Body Structure / Function / Physical Skills Strength;Dexterity;Pain;UE functional use;Body mechanics;IADL;Sensation;Coordination    Rehab Potential Good    Clinical Decision Making Several treatment options, min-mod task modification necessary    Comorbidities Affecting Occupational Performance: May have comorbidities impacting occupational performance    Modification or Assistance to Complete Evaluation  No modification of tasks or assist necessary to complete eval    OT Frequency 2x / week    OT Duration 8 weeks   may reduce down to 1x/wk when appropriate   OT Treatment/Interventions Self-care/ADL training;Moist Heat;Fluidtherapy;DME and/or AE instruction;Splinting;Therapeutic activities;Compression bandaging;Therapeutic exercise;Ultrasound;Coping strategies training;Iontophoresis;Cryotherapy;Passive range of motion;Electrical Stimulation;Paraffin;Manual Therapy;Patient/family education    Plan Place on hold for 2-4 weeks prn; if/when pt returns, begin iontophoresis, continue taping to upper traps prn, will d/c if pt does not return by 03/21/21    Consulted and Agree with Plan of Care Patient             Patient will benefit from skilled therapeutic intervention in order to improve the following deficits and impairments:   Body Structure / Function / Physical Skills: Strength, Dexterity, Pain, UE  functional use, Body mechanics, IADL, Sensation, Coordination       Visit Diagnosis: Pain in right upper arm  Pain in right hand  Pain in right elbow    Problem List Patient Active Problem List   Diagnosis Date Noted  Pain in left knee 05/23/2019   Morbid obesity (HCC) 08/30/2018   Vitamin D deficiency 08/30/2018   Hyperlipidemia associated with type 2 diabetes mellitus (HCC) 08/30/2018   Transaminitis 08/30/2018    Kelli Churn, OTR/L 02/19/2021, 10:16 AM  [facility] [address] [place], [place], [address] Phone: [phone]   Fax:  [phone]  Name: Angelica Berg MRN: [id number] Date of Birth: [birth year]

## 2021-02-24 ENCOUNTER — Other Ambulatory Visit: Payer: Self-pay

## 2021-02-24 ENCOUNTER — Ambulatory Visit: Payer: Medicaid Other | Admitting: Occupational Therapy

## 2021-02-25 ENCOUNTER — Ambulatory Visit (INDEPENDENT_AMBULATORY_CARE_PROVIDER_SITE_OTHER): Payer: Medicaid Other | Admitting: Internal Medicine

## 2021-02-25 ENCOUNTER — Encounter: Payer: Self-pay | Admitting: Internal Medicine

## 2021-02-25 VITALS — BP 138/86 | HR 88 | Temp 97.9°F | Wt 250.6 lb

## 2021-02-25 DIAGNOSIS — E1121 Type 2 diabetes mellitus with diabetic nephropathy: Secondary | ICD-10-CM

## 2021-02-25 DIAGNOSIS — R7401 Elevation of levels of liver transaminase levels: Secondary | ICD-10-CM | POA: Diagnosis not present

## 2021-02-25 DIAGNOSIS — E785 Hyperlipidemia, unspecified: Secondary | ICD-10-CM

## 2021-02-25 DIAGNOSIS — E1169 Type 2 diabetes mellitus with other specified complication: Secondary | ICD-10-CM | POA: Diagnosis not present

## 2021-02-25 LAB — POCT GLYCOSYLATED HEMOGLOBIN (HGB A1C): Hemoglobin A1C: 8.4 % — AB (ref 4.0–5.6)

## 2021-02-25 MED ORDER — TRULICITY 0.75 MG/0.5ML ~~LOC~~ SOAJ: 0.7500 mg | SUBCUTANEOUS | 2 refills | Status: AC

## 2021-02-25 NOTE — Progress Notes (Signed)
Established Patient Office Visit     This visit occurred during the SARS-CoV-2 public health emergency.  Safety protocols were in place, including screening questions prior to the visit, additional usage of staff PPE, and extensive cleaning of exam room while observing appropriate contact time as indicated for disinfecting solutions.    CC/Reason for Visit: Follow-up chronic medical conditions  HPI: Angelica Berg is a 45 y.o. female who is coming in today for the above mentioned reasons. Past Medical History is significant for: Morbid obesity, type 2 diabetes that has not been well controlled, hypertension, hyperlipidemia associated with diabetes.  She has been using her Trulicity weekly, however she does admit to forgetting it some times.  We discussed setting an alarm on her phone as a reminder.  She is otherwise adherent to medical therapy.  She declines flu and COVID vaccinations despite counseling.   Past Medical/Surgical History: Past Medical History:  Diagnosis Date   Diabetes mellitus ([place])    Dyslipidemia     Past Surgical History:  Procedure Laterality Date   CESAREAN SECTION     KNEE ARTHROCENTESIS      Social History:  reports that she quit smoking about 2 years ago. Her smoking use included cigarettes. She has never used smokeless tobacco. She reports that she does not currently use alcohol. She reports current drug use. Frequency: 7.00 times per week. Drug: Marijuana.  Allergies: No Known Allergies  Family History:  Family History  Problem Relation Age of Onset   Diabetes Mother    Hypertension Mother    Cancer Maternal Grandmother        throat   Heart disease Maternal Grandmother      Current Outpatient Medications:    atorvastatin (LIPITOR) 80 MG tablet, TAKE ONE TABLET BY MOUTH DAILY, Disp: 90 tablet, Rfl: 1   Blood Glucose Monitoring Suppl (ACCU-CHEK GUIDE) w/Device KIT, 1 Device by Does not apply route daily. E11.9, Disp: 1 kit, Rfl: 0    Blood Glucose Monitoring Suppl (ONETOUCH VERIO) w/Device KIT, 1 Device by Does not apply route as directed., Disp: 1 kit, Rfl: 0   Dulaglutide (TRULICITY) 7.61 PJ/0.9TO SOPN, Inject 0.75 mg into the skin once a week., Disp: 2 mL, Rfl: 2   ergocalciferol (VITAMIN D2) 1.25 MG (50000 UT) capsule, Take 1 capsule (50,000 Units total) by mouth once a week., Disp: 12 capsule, Rfl: 0   glucose blood (ACCU-CHEK GUIDE) test strip, Use as instructed to test blood sugar 2 times daily E11.9, Disp: 100 each, Rfl: 12   ibuprofen (ADVIL) 800 MG tablet, Take 1 tablet (800 mg total) by mouth every 8 (eight) hours as needed., Disp: 90 tablet, Rfl: 2   lisinopril (ZESTRIL) 5 MG tablet, TAKE ONE TABLET BY MOUTH DAILY, Disp: 90 tablet, Rfl: 1   meloxicam (MOBIC) 15 MG tablet, Take 1 tablet (15 mg total) by mouth daily., Disp: 30 tablet, Rfl: 3   metFORMIN (GLUCOPHAGE) 1000 MG tablet, Take 1 tablet (1,000 mg total) by mouth at bedtime., Disp: 180 tablet, Rfl: 1  Review of Systems:  Constitutional: Denies fever, chills, diaphoresis, appetite change and fatigue.  HEENT: Denies photophobia, eye pain, redness, hearing loss, ear pain, congestion, sore throat, rhinorrhea, sneezing, mouth sores, trouble swallowing, neck pain, neck stiffness and tinnitus.   Respiratory: Denies SOB, DOE, cough, chest tightness,  and wheezing.   Cardiovascular: Denies chest pain, palpitations and leg swelling.  Gastrointestinal: Denies nausea, vomiting, abdominal pain, diarrhea, constipation, blood in stool and abdominal distention.  Genitourinary: Denies dysuria, urgency, frequency, hematuria, flank pain and difficulty urinating.  Endocrine: Denies: hot or cold intolerance, sweats, changes in hair or nails, polyuria, polydipsia. Musculoskeletal: Denies myalgias, back pain, joint swelling, arthralgias and gait problem.  Skin: Denies pallor, rash and wound.  Neurological: Denies dizziness, seizures, syncope, weakness, light-headedness, numbness  and headaches.  Hematological: Denies adenopathy. Easy bruising, personal or family bleeding history  Psychiatric/Behavioral: Denies suicidal ideation, mood changes, confusion, nervousness, sleep disturbance and agitation    Physical Exam: Vitals:   02/25/21 1037  BP: 138/86  Pulse: 88  Temp: 97.9 F (36.6 C)  TempSrc: Oral  SpO2: 99%  Weight: 250 lb 9.6 oz (113.7 kg)    Body mass index is 35.96 kg/m.   Constitutional: NAD, calm, comfortable Eyes: PERRL, lids and conjunctivae normal, wears corrective lenses ENMT: Mucous membranes are moist.  Respiratory: clear to auscultation bilaterally, no wheezing, no crackles. Normal respiratory effort. No accessory muscle use.  Cardiovascular: Regular rate and rhythm, no murmurs / rubs / gallops. No extremity edema.  Neurologic: Grossly intact and nonfocal Psychiatric: Normal judgment and insight. Alert and oriented x 3. Normal mood.    Impression and Plan:  Type 2 diabetes mellitus with diabetic nephropathy, without long-term current use of insulin ([place])  - Plan: POCT glycosylated hemoglobin (Hb A1C), Dulaglutide (TRULICITY) 5.44 BE/0.1EO SOPN -Requesting Trulicity refills today, continue metformin, A1c is down to 8.4 from 9.6 back in August.  Morbid obesity ([place]) -Discussed healthy lifestyle, including increased physical activity and better food choices to promote weight loss.  Hyperlipidemia associated with type 2 diabetes mellitus ([place]) -Lipids in May 2022 showed a total cholesterol of 113, triglycerides 80 LDL 52, at goal, continue daily atorvastatin.  Transaminitis -Recheck LFTs at next lab draw  Time spent: 30 minutes reviewing chart, interviewing and examining patient and formulating plan of care.     Lelon Frohlich, MD [facility]

## 2021-02-26 ENCOUNTER — Encounter: Payer: Medicaid Other | Admitting: Occupational Therapy

## 2021-02-26 ENCOUNTER — Telehealth: Payer: Self-pay | Admitting: Internal Medicine

## 2021-02-26 NOTE — Telephone Encounter (Signed)
..   Medicaid Managed Care   Unsuccessful Outreach Note  02/26/2021 Name: Angelica Berg MRN: [id number] DOB: 10-Jun-1975  Referred by: Philip Aspen, Limmie Patricia, MD Reason for referral : High Risk Managed Medicaid (I called the patient today to offer the services of the [facility] Team. I had to leave my name and number on her VM.)   A second unsuccessful telephone outreach was attempted today. The patient was referred to the case management team for assistance with care management and care coordination.   Follow Up Plan: The care management team will reach out to the patient again over the next 14 days.  Weston Settle Care Guide, High Risk Medicaid Managed Care Embedded Care Coordination [facility]  [facility]

## 2021-03-03 ENCOUNTER — Encounter: Payer: Medicaid Other | Admitting: Occupational Therapy

## 2021-03-05 ENCOUNTER — Encounter: Payer: Medicaid Other | Admitting: Occupational Therapy

## 2021-03-10 ENCOUNTER — Encounter: Payer: Medicaid Other | Admitting: Occupational Therapy

## 2021-03-11 ENCOUNTER — Encounter: Payer: Medicaid Other | Admitting: Occupational Therapy

## 2021-04-07 ENCOUNTER — Telehealth: Payer: Self-pay | Admitting: *Deleted

## 2021-04-07 ENCOUNTER — Ambulatory Visit: Payer: Self-pay

## 2021-04-07 NOTE — Patient Outreach (Signed)
Care Coordination  04/07/2021  DESTYN PARFITT 16-May-1975 [id number]  Successful outreach to patient for scheduled initial assessment, patient  says she forgot about the appointment and requests it be rescheduled to another day next week.   Plan: With patient's  agreement, initial assessment rescheduled to 04/15/21 at 10:30 am.  Cranford Mon RN, CCM, CDCES [place]   [facility] Care Management Coordinator - Managed [place] High Risk [phone]

## 2021-04-10 ENCOUNTER — Other Ambulatory Visit: Payer: Self-pay | Admitting: Internal Medicine

## 2021-04-10 DIAGNOSIS — E119 Type 2 diabetes mellitus without complications: Secondary | ICD-10-CM

## 2021-04-10 DIAGNOSIS — E785 Hyperlipidemia, unspecified: Secondary | ICD-10-CM

## 2021-04-15 ENCOUNTER — Ambulatory Visit: Payer: Self-pay

## 2021-04-15 ENCOUNTER — Telehealth: Payer: Self-pay | Admitting: *Deleted

## 2021-04-15 NOTE — Patient Instructions (Signed)
[name] ,   The [facility] Managed Care Team is available to provide assistance to you with your healthcare needs at no cost and as a benefit of your [facility] plan. I'm sorry I was unable to reach you today for our scheduled appointment. Our care guide will call you to reschedule our telephone appointment. Please call me at the number below. I am available to be of assistance to you regarding your healthcare needs. .   Thank you,   Cranford Mon RN, CCM, CDCES [place]   [facility] Care Management Coordinator - Managed [place] High Risk [phone]

## 2021-04-15 NOTE — Patient Outreach (Signed)
Care Coordination  04/15/2021  [name] [birth year] [phone]  04/15/2021 Name: Angelica Berg MRN: [id number] DOB: 1976/04/08  Referred by: Philip Aspen, Limmie Patricia, MD Reason for referral : High Risk Managed Medicaid (Unsuccessful RNCM initial telephone outreach)   A second unsuccessful telephone outreach was attempted today. The patient was referred to the case management team for assistance with care management and care coordination.    Follow Up Plan: A HIPAA compliant phone message was left for the patient providing contact information and requesting a return call. and The Managed Medicaid care management team will reach out to the patient again over the next 7 days.   Cranford Mon RN, CCM, CDCES [place]   [facility] Care Management Coordinator - Managed [place] High Risk [phone]

## 2021-04-16 ENCOUNTER — Telehealth: Payer: Self-pay | Admitting: Internal Medicine

## 2021-04-16 NOTE — Telephone Encounter (Signed)
.. °  Medicaid Managed Care   Unsuccessful Outreach Note  04/16/2021 Name: Angelica Berg MRN: [id number] DOB: 10-17-1975  Referred by: Philip Aspen, Limmie Patricia, MD Reason for referral : High Risk Managed Medicaid (I called the patient today to get her phone visit with the MM RNCM rescheduled. I left my name and number on her VM.)   An unsuccessful telephone outreach was attempted today. The patient was referred to the case management team for assistance with care management and care coordination.   Follow Up Plan: The care management team will reach out to the patient again over the next 7 days.    Weston Settle Care Guide, High Risk Medicaid Managed Care Embedded Care Coordination [facility]   [facility]

## 2021-04-22 ENCOUNTER — Encounter: Payer: Self-pay | Admitting: *Deleted

## 2021-04-22 ENCOUNTER — Other Ambulatory Visit: Payer: Self-pay | Admitting: *Deleted

## 2021-04-22 DIAGNOSIS — E1142 Type 2 diabetes mellitus with diabetic polyneuropathy: Secondary | ICD-10-CM

## 2021-04-22 NOTE — Patient Outreach (Addendum)
Medicaid Managed Care   Nurse Care Manager Note  04/22/2021 Name:  Angelica Berg MRN:  [id number] DOB:  04/26/1975  Angelica Berg is an 46 y.o. year old female who is a primary patient of [name], [name], MD.  The [facility] Managed Care Coordination team was consulted for assistance with:    HLD DMI Obesity  Ms. Nellums was given information about Medicaid Managed Care Coordination team services today. Angelica Berg Patient agreed to services and verbal consent obtained.  Engaged with patient by telephone for initial visit in response to provider referral for case management and/or care coordination services.   Assessments/Interventions:  Review of past medical history, allergies, medications, health status, including review of consultants reports, laboratory and other test data, was performed as part of comprehensive evaluation and provision of chronic care management services.  SDOH (Social Determinants of Health) assessments and interventions performed: SDOH Interventions    Flowsheet Row Most Recent Value  SDOH Interventions   Food Insecurity Interventions --  [bsw referral food stamps]  Housing Interventions --  [referred to BSW]  Intimate Partner Violence Interventions Intervention Not Indicated  Transportation Interventions Intervention Not Indicated       Care Plan  No Known Allergies  Medications Reviewed Today     Reviewed by [name], RN (Registered Nurse) on 04/22/21 at [id number]  Med List Status: <None>   Medication Order Taking? Sig Documenting Provider Last Dose Status Informant  atorvastatin (LIPITOR) 80 MG tablet [id number] Yes TAKE ONE TABLET BY MOUTH DAILY [name], [name], MD Taking Active   Blood Glucose Monitoring Suppl (ACCU-CHEK GUIDE) w/Device KIT [id number] Yes 1 Device by Does not apply route daily. E11.9 [name], [name], MD Taking Active   Blood Glucose Monitoring Suppl [facility] VERIO) w/Device KIT [id number]  Yes 1 Device by Does not apply route as directed. [name], [name], MD Taking Active   Dulaglutide (TRULICITY) 0.92 ZR/0.0TM SOPN [id number] Yes Inject 0.75 mg into the skin once a week. [name], [name], MD Taking Active   ergocalciferol (VITAMIN D2) 1.25 MG (50000 UT) capsule [id number] No Take 1 capsule (50,000 Units total) by mouth once a week.  Patient not taking: Reported on 04/22/2021   [name], [name], MD Not Taking Active   glucose blood (ACCU-CHEK GUIDE) test strip [id number] Yes Use as instructed to test blood sugar 2 times daily E11.9 [name], [name], MD Taking Active   ibuprofen (ADVIL) 800 MG tablet [id number] Yes Take 1 tablet (800 mg total) by mouth every 8 (eight) hours as needed. [name], MD Taking Active   lisinopril (ZESTRIL) 5 MG tablet [id number] Yes TAKE ONE TABLET BY MOUTH DAILY [name], [name], MD Taking Active   meloxicam (MOBIC) 15 MG tablet [id number] No Take 1 tablet (15 mg total) by mouth daily.  Patient not taking: Reported on 04/22/2021   [name], MD Not Taking Active   metFORMIN (GLUCOPHAGE) 1000 MG tablet [id number] Yes Take 1 tablet (1,000 mg total) by mouth at bedtime. [name], [name], MD Taking Active             Patient Active Problem List   Diagnosis Date Noted   Pain in left knee 05/23/2019   Morbid obesity ([place]) 08/30/2018   Vitamin D deficiency 08/30/2018   Hyperlipidemia associated with type 2 diabetes mellitus ([place]) 08/30/2018   Transaminitis 08/30/2018     Care Plan : RN Care Manager Plan Of Care  Updates made  by [name], RN since 04/22/2021 12:00 AM     Problem: Knowledge Deficit and Care Coordination Needs Related to Management of  DM, obesity and HLD   Priority: High     Long-Range Goal: Development of Plan Of Care to Address Care Coordination Needs and Knowledge Deficits for Management of DM, obesity, HLD   Start Date: 04/22/2021  Expected End Date: 08/25/2021   Priority: High  Note:   Current Barriers:  Knowledge Deficits related to plan of care for management of HLD, DMII, and Obesity  Care Coordination needs related to Financial constraints related to limited income, Limited access to food, and Lacks knowledge of community resource: related to food resources, paying monthly bills including rent, dental resources  Patient states she is a single Mom to twin 58 year old daughters, 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 States she was diagnosed with Type 2 DM two years ago and [facility], she says she stopped checking her CBG routinely because the numbers were always high, says she has not lost any weight since starting Trulicity but admits she does not like to take medications so she does not consistently administer the Trulicity as prescribed , says her eating habits "are terrible" in that she does not have a routine eating schedule and often eats foods that are not good for her diabetes including drinking regular [place] Dry  RNCM Clinical Goal(s):  Patient will verbalize basic understanding of HLD, DMII, and obesity disease process and self health management plan as evidenced by improved Hgb A1C, weight loss or no weight gain, normal lipid panel take all medications exactly as prescribed and will call provider for medication related questions as evidenced by patient reporting improvement in taken medications as prescribed    demonstrate understanding of rationale for each prescribed medication as evidenced by patient able to report to provider purpose of medication    attend all scheduled medical appointments: with primary  care provider on 05/28/21 as evidenced by no missed appointments when appointment history reviewed in medical record         demonstrate improved adherence to prescribed treatment plan for HLD, DMII, and Obesity as evidenced by improved Hgb A1C, weight loss or no weight gain, improvement in lipid panel continue to work with Consulting civil engineer and/or Social Worker to address care management and care coordination needs related to HLD, DMII, and Obesity as evidenced by adherence to CM Team Scheduled appointments     work with Education officer, museum to address Financial constraints related to limited income and Limited access to food related to the management of HLD, DMII, and Obesity as evidenced by review of EMR and patient or Education officer, museum report     work with Data processing manager care guide to address needs related to Limited access to food and dental resources as evidenced by patient and/or community resource care guide support    through collaboration with Consulting civil engineer, provider, and care team.  Work with pharmacist to address inconsistent mediation taking behavior and personal feeling regarding medications. i.e. "I don't like taking so many medications"  Interventions: Inter-disciplinary care team collaboration (see longitudinal plan of care) Evaluation  of current treatment plan related to  self management and patient's adherence to plan as established by provider Reviewed most recent Vit D level, discussed indications for Vit D replacement, importance of and strategies to improve mediation adherence  and provided written information on Vit D deficiency     Interdisciplinary Collaboration Interventions:  (Status: New goal.) Long Term Goal   Collaborated with BSW to initiate plan of care to address needs related to Financial constraints related to limited income, Limited access to food, and Lacks knowledge of community resource: to address food and financial insecurity and dental resources  in patient with HLD,  DMII, and Obesity Collaboration with [name], [name], MD regarding development and update of comprehensive plan of care as evidenced by provider attestation and co-signature Inter-disciplinary care team collaboration     Diabetes:  (Status: New goal.) Long Term Goal   Lab Results  Component Value Date   HGBA1C 8.4 (A) 02/25/2021  Assessed patient's understanding of A1C goal:  <6.0%   Assessed frequency of CBG monitoring, results and reviewed treatment  targets for pre and post meal CBGS Provided education to patient about basic DM disease process; Reviewed medications with patient and discussed importance of medication adherence;        Reviewed prescribed diet with patient CHO controlled; Counseled on importance of regular laboratory monitoring as prescribed;        Discussed plans with patient for ongoing care management follow up and provided patient with direct contact information for care management team;      Reviewed scheduled/upcoming provider appointments including: 05/28/21 with primary care provider;         Mailed "Planning Healthy Meals" brochure to patient's home address Discussed health plan benefits related to behavioral health mobile app since patient is single mother of twin daughters age 1  Hyperlipidemia:  (Status: New goal.) Long Term Goal  Lab Results  Component Value Date   CHOL 113 08/21/2020   HDL 44.70 08/21/2020   LDLCALC 52 08/21/2020   TRIG 80.0 08/21/2020   CHOLHDL 3 08/21/2020     Medication review performed; medication list updated in electronic medical record.  Provider established cholesterol goals reviewed; Counseled on importance of regular laboratory monitoring as prescribed; Reviewed role and benefits of statin for ASCVD risk reduction; Reviewed results of lipid profile completed 08/21/20 and discussed treatment targets, congratulate patient on statin adherence   Weight Loss:  (Status: New goal.)  Advised patient to discuss with  primary care provider options regarding weight management;  Collaboration with provider for dietician referral;  Provided patient and/or caregiver with contact information about benefits offered by health plan to assist with weight management  (community resource or dietician); Mailed summary of health benefits to patient including weight management benefits ( Weight Watchers) , free fruits and vegetables for 6 months ($240) value, up to $75 in rewards to complete wellness activities   Patient Goals/Self-Care Activities: Take medications as prescribed   Attend all scheduled provider appointments Call pharmacy for medication refills 3-7 days in advance of running out of medications Perform all self care activities independently  Perform IADL's (shopping, preparing meals, housekeeping, managing finances) independently Call provider office for new concerns or questions  Work with the social worker to address care coordination needs and will continue to work with the clinical team to address health care and disease management related needs set goal weight fill half of plate with vegetables manage portion size switch to sugar-free drinks - call for medicine refill 2  or 3 days before it runs out - take all medications exactly as prescribed - call doctor with any symptoms you believe are related to your medicine - call doctor when you experience any new symptoms - go to all doctor appointments as scheduled - adhere to prescribed diet: CHO controlled - review summary of health plan benefits received in mail and call member services on [place] plan to get more information about benefits that would benefit you to assist with meeting  treatment goals for DM, obesity and HLD - review Planning Healthy Meals brochure  - review health information on  Vit D deficiency      Follow Up:  Patient agrees to Care Plan and Follow-up.  Plan: The Managed Medicaid care management team  will reach out to the patient again over the next 30 days.  Date/time of next scheduled RN care management/care coordination outreach:  05/26/21 at 11:00 am  Kelli Churn RN, CCM, [place] Management Coordinator - Managed [place] High Risk [phone]

## 2021-04-22 NOTE — Patient Instructions (Addendum)
Visit Information  Angelica Berg was given information about Medicaid Managed Care team care coordination services as a part of their [facility] Medicaid benefit. Angelica Berg verbally consented to engagement [facility].   If you are experiencing a medical emergency, please call 911 or report to your local emergency department or urgent care.   If you have a non-emergency medical problem during routine business hours, please contact your provider's office and ask to speak with a nurse.   For questions related to your [facility] Medicaid, please call: [phone] or visit the homepage here: [name]  If you would like to schedule transportation through your [facility] Medicaid, please call the following number at least 2 days in advance of your appointment: [phone].   Call the Behavioral Health Crisis Line at [phone], at any time, 24 hours a day, 7 days a week. If you are in danger or need immediate medical attention call 911.  If you would like help to quit smoking, call 1-800-QUIT-NOW (949-347-99821-704-270-6703) OR Espaol: 1-855-Djelo-Ya (4-132-440-1027([phone]) o para ms informacin haga clic aqu or Text READY to 253-664200-400 to register via text  Angelica Berg - following are the goals we discussed in your visit today:   Goals Addressed   Take medications as prescribed   Attend all scheduled provider appointments Call pharmacy for medication refills 3-7 days in advance of running out of medications Perform all self care activities independently  Perform IADL's (shopping, preparing meals, housekeeping, managing finances) independently Call provider office for new concerns or questions  Work with the social worker to address care coordination needs and will continue to work with the clinical team to address health care and disease management related needs set  goal weight fill half of plate with vegetables manage portion size switch to sugar-free drinks - call for medicine refill 2 or 3 days before it runs out - take all medications exactly as prescribed - call doctor with any symptoms you believe are related to your medicine - call doctor when you experience any new symptoms - go to all doctor appointments as scheduled - adhere to prescribed diet: CHO controlled - review summary of health plan benefits received in mail and call member services on [facility] Managed medicaid plan to get more information about benefits that would benefit you to assist with meeting  treatment goals for DM, obesity and HLD - review Planning Healthy Meals brochure - review health information on  Vit D deficiency    Please see education materials related to VIt D deficiency  provided as print materials.   The patient verbalized understanding of instructions provided today and agreed to receive a mailed copy of patient instruction and/or educational materials.  The Managed Medicaid care management team will reach out to the patient again over the next 30 days.   Cranford MonJanet Reakwon Barren RN, CCM, CDCES [name]   [facility] Care Management Coordinator - [place]aged [place] [phone]   Following is a copy of your plan of care:  Care Plan : RN Care Manager Plan Of Care  Updates made by Bary RichardHauser, Aara Jacquot S, RN since 04/22/2021 12:00 AM     Problem: Knowledge Deficit and Care Coordination Needs Related to Management of  DM, obesity and HLD   Priority: High     Long-Range Goal: Development of Plan Of Care to Address Care Coordination Needs and Knowledge Deficits for Management of DM, obesity, HLD   Start  Date: 04/22/2021  Expected End Date: 08/25/2021  Priority: High  Note:   Current Barriers:  Knowledge Deficits related to plan of care for management of HLD, DMII, and Obesity  Care Coordination needs related to Financial constraints related to  limited income, Limited access to food, and Lacks knowledge of community resource: related to food resources, paying monthly bills including rent, dental resources  Patient states she is a single Mo to twin 68 year old daughters, 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 States she was diagnoses with DM 2 years ago and [facility], she says she stopped checking her CBG routinely because the numbers were always high, says she has not lost any weight since starting Trulicity but admits she does not like to take medications so she does not consistently administer the Trulicity as prescribed , says her eating habits "are terrible" in that she does not have a routine eating schedule and often eats foods that are not good for her diabetes including drinking regular [place] Dry  RNCM Clinical Goal(s):  Patient will verbalize basic understanding of HLD, DMII, and obesity disease process and self health management plan as evidenced by improved Hgb A1C, weight loss or no weight gain, normal lipid panel take all medications exactly as prescribed and will call provider for medication related questions as evidenced by patient reporting improvement in taken medications as prescribed    demonstrate understanding of rationale for each prescribed medication as evidenced by patient able to report to provider purpose of medication    attend all scheduled medical appointments: with primary care provider on 05/28/21 as evidenced by no missed appointments when appointment history reviewed in medical record         demonstrate improved adherence to prescribed treatment plan for HLD, DMII, and Obesity as  evidenced by improved Hgb A1C, weight loss of no weight gain, improvement in lipid panel continue to work with Medical illustrator and/or Social Worker to address care management and care coordination needs related to HLD, DMII, and Obesity as evidenced by adherence to CM Team Scheduled appointments     work with Child psychotherapist to address Financial constraints related to limited income and Limited access to food related to the management of HLD, DMII, and Obesity as evidenced by review of EMR and patient or Child psychotherapist report     work with Publishing rights manager care guide to address needs related to Limited access to food and dental resources as evidenced by patient and/or community resource care guide support    through collaboration with Medical illustrator, provider, and care team.  Work with pharmacist to address inconsistent mediation taking behavior and personal feeling regarding medications. i.e. "I don't like taking so many medications  Interventions: Inter-disciplinary care team collaboration (see longitudinal plan of care) Evaluation of current treatment plan related to  self management and patient's adherence to plan as established by provider Reviewed most recent Vit D level, discussed indications for Vit D replacement, importance of and strategies to improve mediation adherence  and provided written information on Vit D deficiency    Interdisciplinary Collaboration Interventions:  (Status: New goal.) Long Term Goal   Collaborated  with BSW to initiate plan of care to address needs related to Financial constraints related to limited income, Limited access to food, and Lacks knowledge of community resource: to address food and financial insecurity and dental resources  in patient with HLD, DMII, and Obesity Collaboration with Philip AspenHernandez Acosta, Limmie PatriciaEstela Y, MD regarding development and update of comprehensive plan of care as evidenced by provider attestation and co-signature Inter-disciplinary care team  collaboration     Diabetes:  (Status: New goal.) Long Term Goal   Lab Results  Component Value Date   HGBA1C 8.4 (A) 02/25/2021  Assessed patient's understanding of A1C goal:  <6.0%   Assessed frequency of CBG monitoring, results and reviewed treatment  targets for pre and post meal CBGS Provided education to patient about basic DM disease process; Reviewed medications with patient and discussed importance of medication adherence;        Reviewed prescribed diet with patient CHO controlled; Counseled on importance of regular laboratory monitoring as prescribed;        Discussed plans with patient for ongoing care management follow up and provided patient with direct contact information for care management team;      Reviewed scheduled/upcoming provider appointments including: 05/28/21 with primary care provider;         Mailed "Planning Healthy Meals" brochure to patient's home address Discussed health plan benefits related to behavioral health mobile app since patient is single mother of twin daughters age 44  Hyperlipidemia:  (Status: New goal.) Long Term Goal  Lab Results  Component Value Date   CHOL 113 08/21/2020   HDL 44.70 08/21/2020   LDLCALC 52 08/21/2020   TRIG 80.0 08/21/2020   CHOLHDL 3 08/21/2020     Medication review performed; medication list updated in electronic medical record.  Provider established cholesterol goals reviewed; Counseled on importance of regular laboratory monitoring as prescribed; Reviewed role and benefits of statin for ASCVD risk reduction; Reviewed results of lipid profile completed 08/21/20 and discussed treatment targets, congratulate patient on statin adherence   Weight Loss:  (Status: New goal.)  Advised patient to discuss with primary care provider options regarding weight management;  Collaboration with provider for dietician referral;  Provided patient and/or caregiver with contact information about benefits offered by health plan to  assist with weight management  (community resource or dietician); Mailed summary of health benefits to patient including weight management benefits ( Weight Watchers) , free fruits and vegetables for 6 months ($240) value, up to $75 in rewards to complete wellness activities   Patient Goals/Self-Care Activities: Take medications as prescribed   Attend all scheduled provider appointments Call pharmacy for medication refills 3-7 days in advance of running out of medications Perform all self care activities independently  Perform IADL's (shopping, preparing meals, housekeeping, managing finances) independently Call provider office for new concerns or questions  Work with the social worker to address care coordination needs and will continue to work with the clinical team to address health care and disease management related needs set goal weight fill half of plate with vegetables manage portion size switch to sugar-free drinks - call for medicine refill 2 or 3 days before it runs out - take all medications exactly as prescribed - call doctor with any symptoms you believe are related to your medicine - call doctor when you experience any new symptoms - go to all doctor appointments as scheduled - adhere to prescribed diet: CHO controlled - review summary of health plan benefits received in mail and call member  services on [facility] Community Managed medicaid plan to get more information about benefits that would benefit you to assist with meeting  treatment goals for DM, obesity and HLD - review Planning Healthy Meals brochure - review health information on  Vit D deficiency

## 2021-04-23 ENCOUNTER — Telehealth: Payer: Self-pay | Admitting: Internal Medicine

## 2021-04-23 NOTE — Telephone Encounter (Signed)
.. °  Medicaid Managed Care   Unsuccessful Outreach Note  04/23/2021 Name: Angelica Berg MRN: [id number] DOB: 04/16/1976  Referred by: Philip Aspen, Limmie Patricia, MD Reason for referral : High Risk Managed Medicaid (I called the patient today to get her scheduled with the MM Pharmacist and the BSW. I left my name and number on her V>)   An unsuccessful telephone outreach was attempted today. The patient was referred to the case management team for assistance with care management and care coordination.   Follow Up Plan: The care management team will reach out to the patient again over the next 7 days.   Weston Settle Care Guide, High Risk Medicaid Managed Care Embedded Care Coordination [facility]   [facility]

## 2021-04-29 ENCOUNTER — Telehealth: Payer: Self-pay | Admitting: Internal Medicine

## 2021-04-29 NOTE — Telephone Encounter (Signed)
.. °  Medicaid Managed Care   Unsuccessful Outreach Note  04/29/2021 Name: Angelica Berg MRN: [id number] DOB: 08-17-1975  Referred by: [name], [name], MD Reason for referral : High Risk Managed Medicaid (I called the patient today to get her scheduled for phone visits with the MM Pharmacist and the MM BSW. Unable to leave a VM.)   A second unsuccessful telephone outreach was attempted today. The patient was referred to the case management team for assistance with care management and care coordination.   Follow Up Plan: The care management team will reach out to the patient again over the next 7 days.     [place]

## 2021-05-14 ENCOUNTER — Telehealth: Payer: Self-pay | Admitting: Internal Medicine

## 2021-05-14 NOTE — Telephone Encounter (Signed)
.. °  Medicaid Managed Care   Unsuccessful Outreach Note  05/14/2021 Name: Angelica Berg MRN: [id number] DOB: 02/14/1976  Referred by: Philip Aspen, Limmie Patricia, MD Reason for referral : High Risk Managed Medicaid (I called the patient today to get her scheduled with the MM BSW. I left my name and number on her VM.)   A second unsuccessful telephone outreach was attempted today. The patient was referred to the case management team for assistance with care management and care coordination.   Follow Up Plan: The care management team will reach out to the patient again over the next 7 days.    Weston Settle Care Guide, High Risk Medicaid Managed Care Embedded Care Coordination [facility]   [facility]

## 2021-05-15 ENCOUNTER — Telehealth: Payer: Self-pay

## 2021-05-15 NOTE — Telephone Encounter (Signed)
° °  Telephone encounter was:  Unsuccessful.  05/15/2021 Name: Angelica Berg MRN: [id number] DOB: [birth year]  Unsuccessful outbound call made today to assist with:  Food Insecurity and Financial Difficulties related to bills.  Outreach Attempt:  1st Attempt  A HIPAA compliant voice message was left requesting a return call.  Instructed patient to call back at [phone] at their earliest convenience.  [place] management  [place], [place]  Main Phone: [phone]   E-mail: [email or web address]  Website: [email or web address]

## 2021-05-19 ENCOUNTER — Telehealth: Payer: Self-pay

## 2021-05-19 NOTE — Telephone Encounter (Signed)
° °  Telephone encounter was:  Unsuccessful.  05/19/2021 Name: Angelica Berg MRN: [id number] DOB: Jul 29, 1975  Unsuccessful outbound call made today to assist with:  Food Insecurity and Financial Difficulties related to bills.  Outreach Attempt:  2nd Attempt  A HIPAA compliant voice message was left requesting a return call.  Instructed patient to call back at [phone] at their earliest convenience.  [facility] Guide, Embedded Care Coordination [facility]  [place], [place] [address]  Main Phone: [phone]   E-mail: [email or web address]  Website: [email or web address]

## 2021-05-20 ENCOUNTER — Telehealth: Payer: Self-pay

## 2021-05-20 NOTE — Telephone Encounter (Signed)
° °  Telephone encounter was:  Successful.  05/20/2021 Name: Angelica Berg MRN: [id number] DOB: Mar 07, 1976  Angelica Berg is a 46 y.o. year old female who is a primary care patient of [name], [name], MD . The community resource team was consulted for assistance with Food Insecurity and Financial Difficulties related to bills.  Care guide performed the following interventions:  Information has been provided by phone, sent in e-mail and patient confirmed e-mail received  . Enclosed in e-mail: [facility] Emergency assistance, [employer] information and their calendar with their availability, Location manager for food stamps if you have not yet applied, [employer], [employer] and Resources for [employer], [employer] assistance program, and a guide to accessing [employer] to help with your needs.)  Patient advised in regards to bill - she is waiting on her property office to fix her bill as it is incorrect right now, therefore, she can not accept assistance until she knows where she stands. However, patient advised that her property office should be contacting her today with that information, therefore, I sent everything she requested for.  Patient also needed to find a dental provider and I sent her 3 based off of Medicaid's website.  Follow Up Plan:  No further follow up planned at this time. The patient has been provided with needed resources. Patient has my e-mail and phone number to contact me if she has any further questions or concerns.  [place] management  [place], [place]  Main Phone: [phone]   E-mail: [email or web address]  Website: [email or web address]

## 2021-05-26 ENCOUNTER — Other Ambulatory Visit: Payer: Self-pay | Admitting: *Deleted

## 2021-05-26 NOTE — Patient Outreach (Signed)
Medicaid Managed Care   Nurse Care Manager Note  05/26/2021 Name:  Angelica Berg MRN:  [id number] DOB:  May 13, 1975  Angelica Berg is an 46 y.o. year old female who is a primary patient of [name], [name], MD.  The [facility] Managed Care Coordination team was consulted for assistance with:    HLD DM  Obesity  Ms. Vickrey was given information about Medicaid Managed Care Coordination team services today. Angelica Berg Patient agreed to services and verbal consent obtained.  Engaged with patient by telephone for follow up visit in response to provider referral for case management and/or care coordination services.   Assessments/Interventions:  Review of past medical history, allergies, medications, health status, including review of consultants reports, laboratory and other test data, was performed as part of comprehensive evaluation and provision of chronic care management services.  SDOH (Social Determinants of Health) assessments and interventions performed:   Care Plan  No Known Allergies  Medications Reviewed Today     Reviewed by [name], RN (Registered Nurse) on 05/26/21 at 1121  Med List Status: <None>   Medication Order Taking? Sig Documenting Provider Last Dose Status Informant  atorvastatin (LIPITOR) 80 MG tablet [id number] No TAKE ONE TABLET BY MOUTH DAILY [name], [name], MD Taking Active            Med Note [name], [name]   Tue Apr 22, 2021  1:52 PM) Takes at hs with Metformin and Lisinopril  Blood Glucose Monitoring Suppl (ACCU-CHEK GUIDE) w/Device KIT [id number] No 1 Device by Does not apply route daily. E11.9 [name], [name], MD Taking Active            Med Note [name], [name]   Tue Apr 22, 2021  1:53 PM) States she checks CBG prn in the morning and sometimes after meals  Blood Glucose Monitoring Suppl (ONETOUCH VERIO) w/Device KIT [id number] No 1 Device by Does not apply route as directed. [name], [name], MD  Taking Active   Dulaglutide (TRULICITY) 8.31 DV/7.6HY SOPN [id number] No Inject 0.75 mg into the skin once a week. [name], [name], MD Taking Active            Med Note [name], [name] Apr 22, 2021  1:54 PM) States she is not consistent in administering the injection weekly   ergocalciferol (VITAMIN D2) 1.25 MG (50000 UT) capsule [id number] No Take 1 capsule (50,000 Units total) by mouth once a week.  Patient not taking: Reported on 04/22/2021   [name], [name], MD Not Taking Active            Med Note [name], [name] Apr 22, 2021  1:55 PM) Reviewed most recent Vit D level and discussed indications for taking Vit D, encouraged her to take the weekly medication when she injects Trulicity to help with adherence   glucose blood (ACCU-CHEK GUIDE) test strip [id number] No Use as instructed to test blood sugar 2 times daily E11.9 [name], [name], MD Taking Active   ibuprofen (ADVIL) 800 MG tablet [id number] No Take 1 tablet (800 mg total) by mouth every 8 (eight) hours as needed. [name], MD Taking Active   lisinopril (ZESTRIL) 5 MG tablet [id number] No TAKE ONE TABLET BY MOUTH DAILY [name], [name], MD Taking Active   meloxicam (MOBIC) 15 MG tablet [id number] No Take 1 tablet (15 mg total) by mouth daily.  Patient not taking: Reported  on 04/22/2021   [name], MD Not Taking Active            Med Note [name], [name] Apr 22, 2021  1:57 PM) Patient states she started the medication because she didn't know what it was for so discussed indications and encouraged her to try medication to assess efficacy with managing right hand and elbow pain  metFORMIN (GLUCOPHAGE) 1000 MG tablet [id number] No Take 1 tablet (1,000 mg total) by mouth at bedtime. [name], [name], MD Taking Active            Med Note [name], [name] Apr 22, 2021  1:57 PM) [name] at night with atorvastatin and lisinopril            Patient Active Problem  List   Diagnosis Date Noted   Pain in left knee 05/23/2019   Morbid obesity ([place]) 08/30/2018   Vitamin D deficiency 08/30/2018   Hyperlipidemia associated with type 2 diabetes mellitus ([place]) 08/30/2018   Transaminitis 08/30/2018    Care Plan : RN Care Manager Plan Of Care  Updates made by [name], RN since 05/26/2021 12:00 AM     Problem: Knowledge Deficit and Care Coordination Needs Related to Management of  DM, obesity and HLD   Priority: High     Long-Range Goal: Development of Plan Of Care to Address Care Coordination Needs and Knowledge Deficits for Management of DM, obesity, HLD   Start Date: 04/22/2021  Expected End Date: 08/25/2021  Priority: High  Note:   Current Barriers:  Knowledge Deficits related to plan of care for management of HLD, DMII, and Obesity  Care Coordination needs related to Financial constraints related to limited income, Limited access to food, and Lacks knowledge of community resource: related to food resources, paying monthly bills including rent, dental resources  Patient states she was asked to 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 calendar and health information and the community resources for food, dental providers and financial assistance that the community care guide e-mailed her on 05/20/21 RNCM Clinical Goal(s):  Patient will verbalize basic understanding of HLD, DMII, and obesity disease process and self health management plan as evidenced by improved Hgb A1C, weight loss or no weight gain, normal lipid panel take all medications exactly as prescribed and will call provider for medication related questions as evidenced by patient reporting improvement in taken medications as prescribed    demonstrate understanding of rationale for each prescribed medication as evidenced by patient able to report to  provider purpose of medication    attend all scheduled medical appointments: with primary care provider on 05/28/21 as evidenced by no missed appointments when appointment history reviewed in medical record         demonstrate improved adherence to prescribed treatment plan for HLD, DMII, and Obesity as evidenced by improved Hgb A1C, weight loss of no weight gain, improvement in lipid panel continue to work with Consulting civil engineer and/or Social Worker to address care management and care coordination needs related to HLD, DMII, and Obesity as evidenced by adherence to CM Team Scheduled appointments     work with Education officer, museum to address Financial constraints related to limited income and Limited access to food related to the management of HLD, DMII, and Obesity as evidenced by review of EMR and  patient or social worker report     through collaboration with Consulting civil engineer, provider, and care team.  Work with pharmacist to address inconsistent medication taking behavior and personal feeling regarding medications. i.e. "I don't like taking so many medications"  Interventions: Inter-disciplinary care team collaboration (see longitudinal plan of care) Evaluation of current treatment plan related to  self management and patient's adherence to plan as established by provider Ensured patient received [place] Management calendar and health information in the mail and the community resources e-mailed to her on 05/20/21 by the community care guide Advised patient that scheduling care guide was unsuccessful in reaching her to schedule  initial appointments with the managed Medicaid BSW and pharmacist.  Messaged scheduling care guide Reita Chard of times patient can reach patient to schedule initial appointments with the managed Medicaid BSW and pharmacist.     Interdisciplinary Collaboration Interventions:  (Status: Goal on track:  NO.) Long Term Goal  - patient has not spoken with managed  Medicaid BSW or pharmacist as of today due to patient's inability to be reached to arrange initial appointments  Collaborated with BSW to initiate plan of care to address needs related to Financial constraints related to limited income, Limited access to food, and Lacks knowledge of community resource: to address food and financial insecurity and dental resources  in patient with HLD, DMII, and Obesity Collaboration with [name], [name], MD regarding development and update of comprehensive plan of care as evidenced by provider attestation and co-signature Inter-disciplinary care team collaboration     Diabetes:  (Status: Condition stable. Not addressed this visit.) Long Term Goal   Lab Results  Component Value Date   HGBA1C 8.4 (A) 02/25/2021  Assessed patient's understanding of A1C goal:  <6.0%   Assessed frequency of CBG monitoring, results and reviewed treatment  targets for pre and post meal CBGS Provided education to patient about basic DM disease process; Reviewed medications with patient and discussed importance of medication adherence;        Reviewed prescribed diet with patient CHO controlled; Counseled on importance of regular laboratory monitoring as prescribed;        Discussed plans with patient for ongoing care management follow up and provided patient with direct contact information for care management team;      Reviewed scheduled/upcoming provider appointments including: 05/28/21 with primary care provider;         Ensured patient received "Planning Healthy Meals" brochure that was mailed to patient's home address- will discuss further on 06/02/21 Will review health plan benefits related to behavioral health mobile app since patient is single mother of twin daughters age 28 on 06/02/21  Hyperlipidemia:  (Status: New goal.) Long Term Goal  Lab Results  Component Value Date   CHOL 113 08/21/2020   HDL 44.70 08/21/2020   LDLCALC 52 08/21/2020   TRIG 80.0 08/21/2020    CHOLHDL 3 08/21/2020     Medication review performed; medication list updated in electronic medical record.  Provider established cholesterol goals reviewed; Counseled on importance of regular laboratory monitoring as prescribed; Reviewed role and benefits of statin for ASCVD risk reduction; Reviewed results of lipid profile completed 08/21/20 and discussed treatment targets, congratulate patient on statin adherence   Weight Loss:  (Status: Condition stable. Not addressed this visit.) Long term Goal Advised patient to discuss with primary care provider options regarding weight management;  Collaboration with provider for dietician referral;  Provided patient and/or caregiver with contact information about benefits  offered by health plan to assist with weight management  Advice worker or dietician); Ensured patient received summary of health plan benefits related to  weight management benefits ( [employer]) , free fruits and vegetables for 6 months ($240) value, up to $75 in rewards to complete wellness activities - will discuss further on 06/02/21 Messaged primary care provider on 05/26/21 that she will be seeing patient on 2/8 in the office and requested referral to the [facility] to assist with blood sugar management and medical nutrition therapy and/or to the [place] [facility] for weight management assistance  Patient Goals/Self-Care Activities: Take medications as prescribed   Attend all scheduled provider appointments Call pharmacy for medication refills 3-7 days in advance of running out of medications Perform all self care activities independently  Perform IADL's (shopping, preparing meals, housekeeping, managing finances) independently Call provider office for new concerns or questions  Work with the social worker to address care coordination needs and will continue to work with the clinical team to address health care  and disease management related needs set goal weight fill half of plate with vegetables manage portion size switch to sugar-free drinks - call for medicine refill 2 or 3 days before it runs out - take all medications exactly as prescribed - call doctor with any symptoms you believe are related to your medicine - call doctor when you experience any new symptoms - go to all doctor appointments as scheduled - adhere to prescribed diet: CHO controlled - review summary of health plan benefits received in mail and call member services on [facility] plan to get more information about benefits that would benefit you to assist with meeting  treatment goals for DM, obesity and HLD - review Planning Healthy Meals brochure - review health information on  Vit D deficiency        Follow Up:  Patient agrees to Care Plan and Follow-up.  Plan: The Managed Medicaid care management team will reach out to the patient again over the next 30 days.  Date/time of next scheduled RN care management/care coordination outreach:  06/02/21 at 11:00 am  Kelli Churn RN, CCM, [place] Management Coordinator - Managed [place] High Risk [phone]

## 2021-05-26 NOTE — Patient Instructions (Signed)
Visit Information  Ms. Asch was given information about Medicaid Managed Care team care coordination services as a part of their [place] Medicaid benefit. [name] verbally consented to engagement with the [facility] Managed Care team.   If you are experiencing a medical emergency, please call 911 or report to your local emergency department or urgent care.   If you have a non-emergency medical problem during routine business hours, please contact your provider's office and ask to speak with a nurse.   For questions related to your [facility], please call: [phone] or visit the homepage here: [email or web address]  If you would like to schedule transportation through your [facility], please call the following number at least 2 days in advance of your appointment: [phone].   Call the [place] at [phone], at any time, 24 hours a day, 7 days a week. If you are in danger or need immediate medical attention call 911.  If you would like help to quit smoking, call 1-800-QUIT-NOW (351) 789-1870) OR Espaol: 1-855-Djelo-Ya HD:1601594) o para ms informacin haga clic aqu or Text READY to 200-400 to register via text  [name] - following are the goals we discussed in your visit today:   Goals Addressed   Take medications as prescribed   Attend all scheduled provider appointments Call pharmacy for medication refills 3-7 days in advance of running out of medications Perform all self care activities independently  Perform IADL's (shopping, preparing meals, housekeeping, managing finances) independently Call provider office for new concerns or questions  Work with the social worker to address care coordination needs and will continue to work with the clinical team to address health care and disease management related needs set  goal weight fill half of plate with vegetables manage portion size switch to sugar-free drinks - call for medicine refill 2 or 3 days before it runs out - take all medications exactly as prescribed - call doctor with any symptoms you believe are related to your medicine - call doctor when you experience any new symptoms - go to all doctor appointments as scheduled - adhere to prescribed diet: CHO controlled - review summary of health plan benefits received in mail and call member services on [place] plan to get more information about benefits that would benefit you to assist with meeting  treatment goals for DM, obesity and HLD - review Planning Healthy Meals brochure - review health information on  Vit D deficiency    The patient verbalized understanding of instructions provided today and agreed to review patient instruction and/or educational materials via My Chart.  The Managed Medicaid care management team will reach out to the patient again over the next 30 days.   Kelli Churn RN, CCM, [place] Management Coordinator - Managed [place] High Risk [phone]   Following is a copy of your plan of care:  Care Plan : [place]  Updates made by [name], RN since 05/26/2021 12:00 AM     Problem: Knowledge Deficit and Care Coordination Needs Related to Management of  DM, obesity and HLD   Priority: High     Long-Range Goal: Development of Plan Of Care to Address Care Coordination Needs and Knowledge Deficits for Management of DM, obesity, HLD   Start Date: 04/22/2021  Expected End Date: 08/25/2021  Priority: High  Note:   Current Barriers:  Knowledge Deficits related to plan of care for management of HLD, DMII, and Obesity  Care Coordination needs related to Financial constraints related to limited income, Limited access to food, and Lacks knowledge of community resource: related to food  resources, paying monthly bills including rent, dental resources  Patient states she was asked to work today (she is a full time Interior and spatial designerhairdresser) so she doesn't have time to complete the follow up telephone assessment, states she did receive the [facility] Care Management calendar and health information and the community resources for food, dental providers and financial assistance that the community care guide e-mailed her on 05/20/21 RNCM Clinical Goal(s):  Patient will verbalize basic understanding of HLD, DMII, and obesity disease process and self health management plan as evidenced by improved Hgb A1C, weight loss or no weight gain, normal lipid panel take all medications exactly as prescribed and will call provider for medication related questions as evidenced by patient reporting improvement in taken medications as prescribed    demonstrate understanding of rationale for each prescribed medication as evidenced by patient able to report to provider purpose of medication    attend all scheduled medical appointments: with primary care provider on 05/28/21 as evidenced by no missed appointments when appointment history reviewed in medical record         demonstrate improved adherence to prescribed treatment plan for HLD, DMII, and Obesity as evidenced by improved Hgb A1C, weight loss of no weight gain, improvement in lipid panel continue to work with Medical illustratorN Care Manager and/or Social Worker to address care management and care coordination needs related to HLD, DMII, and Obesity as evidenced by adherence to CM Team Scheduled appointments     work with Child psychotherapistsocial worker to address Financial constraints related to limited income and Limited access to food related to the management of HLD, DMII, and Obesity as evidenced by review of EMR and patient or Child psychotherapistsocial worker report     through collaboration with Medical illustratorN Care manager, provider, and care team.  Work with pharmacist to address inconsistent medication taking  behavior and personal feeling regarding medications. i.e. "I don't like taking so many medications"  Interventions: Inter-disciplinary care team collaboration (see longitudinal plan of care) Evaluation of current treatment plan related to  self management and patient's adherence to plan as established by provider Ensured patient received [facility] Care Management calendar and health information in the mail and the community resources e-mailed to her on 05/20/21 by the community care guide Advised patient that scheduling care guide was unsuccessful in reaching her to schedule  initial appointments with the managed Medicaid BSW and pharmacist.  Messaged scheduling care guide Weston SettleJennifer Alley of times patient can reach patient to schedule initial appointments with the managed Medicaid BSW and pharmacist.     Interdisciplinary Collaboration Interventions:  (Status: Goal on track:  NO.) Long Term Goal  - patient has not spoken with managed Medicaid BSW or pharmacist as of today due to patient's inability to be reached to arrange initial appointments  Collaborated with BSW to initiate plan of care to address needs related to Financial constraints related to limited income, Limited access to food, and Lacks knowledge of community resource: to address food and financial insecurity and dental resources  in patient with HLD, DMII, and Obesity Collaboration with Philip AspenHernandez Acosta, Limmie PatriciaEstela Y, MD regarding development and update of comprehensive plan of care as evidenced by provider attestation and co-signature Inter-disciplinary care team collaboration     Diabetes:  (Status: Condition  stable. Not addressed this visit.) Long Term Goal   Lab Results  Component Value Date   HGBA1C 8.4 (A) 02/25/2021  Assessed patient's understanding of A1C goal:  <6.0%   Assessed frequency of CBG monitoring, results and reviewed treatment  targets for pre and post meal CBGS Provided education to patient about  basic DM disease process; Reviewed medications with patient and discussed importance of medication adherence;        Reviewed prescribed diet with patient CHO controlled; Counseled on importance of regular laboratory monitoring as prescribed;        Discussed plans with patient for ongoing care management follow up and provided patient with direct contact information for care management team;      Reviewed scheduled/upcoming provider appointments including: 05/28/21 with primary care provider;         Ensured patient received "Planning Healthy Meals" brochure that was mailed to patient's home address- will discuss further on 06/02/21 Will review health plan benefits related to behavioral health mobile app since patient is single mother of twin daughters age 33 on 06/02/21  Hyperlipidemia:  (Status: New goal.) Long Term Goal  Lab Results  Component Value Date   CHOL 113 08/21/2020   HDL 44.70 08/21/2020   LDLCALC 52 08/21/2020   TRIG 80.0 08/21/2020   CHOLHDL 3 08/21/2020     Medication review performed; medication list updated in electronic medical record.  Provider established cholesterol goals reviewed; Counseled on importance of regular laboratory monitoring as prescribed; Reviewed role and benefits of statin for ASCVD risk reduction; Reviewed results of lipid profile completed 08/21/20 and discussed treatment targets, congratulate patient on statin adherence   Weight Loss:  (Status: Condition stable. Not addressed this visit.) Long term Goal Advised patient to discuss with primary care provider options regarding weight management;  Collaboration with provider for dietician referral;  Provided patient and/or caregiver with contact information about benefits offered by health plan to assist with weight management  (community resource or dietician); Ensured patient received summary of health plan benefits related to  weight management benefits ( Weight Watchers) , free fruits and vegetables  for 6 months ($240) value, up to $75 in rewards to complete wellness activities - will discuss further on 06/02/21 Messaged primary care provider on 05/26/21 that she will be seeing patient on 2/8 in the office and requested referral to the [facility] to assist with blood sugar management and medical nutrition therapy and/or to the [facility] for weight management assistance  Patient Goals/Self-Care Activities: Take medications as prescribed   Attend all scheduled provider appointments Call pharmacy for medication refills 3-7 days in advance of running out of medications Perform all self care activities independently  Perform IADL's (shopping, preparing meals, housekeeping, managing finances) independently Call provider office for new concerns or questions  Work with the social worker to address care coordination needs and will continue to work with the clinical team to address health care and disease management related needs set goal weight fill half of plate with vegetables manage portion size switch to sugar-free drinks - call for medicine refill 2 or 3 days before it runs out - take all medications exactly as prescribed - call doctor with any symptoms you believe are related to your medicine - call doctor when you experience any new symptoms - go to all doctor appointments as scheduled - adhere to prescribed diet: CHO controlled - review summary of health plan benefits received in mail and  call member services on [place] plan to get more information about benefits that would benefit you to assist with meeting  treatment goals for DM, obesity and HLD - review Planning Healthy Meals brochure - review health information on  Vit D deficiency

## 2021-05-28 ENCOUNTER — Telehealth: Payer: Medicaid Other | Admitting: Internal Medicine

## 2021-05-28 ENCOUNTER — Other Ambulatory Visit: Payer: Self-pay | Admitting: *Deleted

## 2021-05-28 DIAGNOSIS — E119 Type 2 diabetes mellitus without complications: Secondary | ICD-10-CM

## 2021-05-28 MED ORDER — LISINOPRIL 5 MG PO TABS: 5.0000 mg | ORAL_TABLET | Freq: Every day | ORAL | 1 refills | Status: DC

## 2021-06-02 ENCOUNTER — Other Ambulatory Visit: Payer: Self-pay

## 2021-06-02 ENCOUNTER — Other Ambulatory Visit: Payer: Self-pay | Admitting: *Deleted

## 2021-06-02 NOTE — Patient Outreach (Signed)
Care Coordination  06/02/2021  Angelica Berg [birth year] [id number]   Medicaid Managed Care   Unsuccessful Outreach Note  06/02/2021 Name: Angelica Berg MRN: [id number] DOB: 1976-01-31  Referred by: Philip Aspen, Limmie Patricia, MD Reason for referral : High Risk Managed Medicaid (MM social Work Unsuccessful Telephone Outreach)   An unsuccessful telephone outreach was attempted today. The patient was referred to the case management team for assistance with care management and care coordination.   Follow Up Plan: The care management team will reach out to the patient again over the next 7 days.  Marland Kitchenajs

## 2021-06-02 NOTE — Patient Instructions (Signed)
Visit Information  Angelica Berg was given information about Medicaid Managed Care team care coordination services as a part of their [facility] Medicaid benefit. Angelica Berg verbally consented to engagement with the [facility] Managed Care team.   If you are experiencing a medical emergency, please call 911 or report to your local emergency department or urgent care.   If you have a non-emergency medical problem during routine business hours, please contact your provider's office and ask to speak with a nurse.   For questions related to your [facility], please call: [phone] or visit the homepage here: [email or web address]  If you would like to schedule transportation through your [facility], please call the following number at least 2 days in advance of your appointment: [phone].   Call the Behavioral Health Crisis Line at [phone], at any time, 24 hours a day, 7 days a week. If you are in danger or need immediate medical attention call 911.  If you would like help to quit smoking, call 1-800-QUIT-NOW (779-819-8044) OR Espaol: 1-855-Djelo-Ya (8-264-158-3094) o para ms informacin haga clic aqu or Text READY to 076-808 to register via text  Angelica Berg - following are the goals we discussed in your visit today:   Goals Addressed   Take medications as prescribed   Attend all scheduled provider appointments Call pharmacy for medication refills 3-7 days in advance of running out of medications Perform all self care activities independently  Perform IADL's (shopping, preparing meals, housekeeping, managing finances) independently Call provider office for new concerns or questions  Work with the social worker to address care coordination needs and will continue to work with the clinical team to address health care and disease management related needs set  goal weight fill half of plate with vegetables manage portion size switch to sugar-free drinks - call for medicine refill 2 or 3 days before it runs out - take all medications exactly as prescribed - call doctor with any symptoms you believe are related to your medicine - call doctor when you experience any new symptoms - go to all doctor appointments as scheduled - adhere to prescribed diet: CHO controlled -  discuss referral to [facility] with primary care provider   Patient verbalizes understanding of instructions and care plan provided today and agrees to view in MyChart. Active MyChart status confirmed with patient.    The Managed Medicaid care management team will reach out to the patient again over the next 30 days.   Cranford Mon RN, CCM, [facility] [place]   [facility] Care Management Coordinator - Managed [place] High Risk [phone]   Following is a copy of your plan of care:  Care Plan : RN Care Manager Plan Of Care  Updates made by Bary Richard, RN since 06/02/2021 12:00 AM     Problem: Knowledge Deficit and Care Coordination Needs Related to Management of  DM, obesity and HLD   Priority: High     Long-Range Goal: Development of Plan Of Care to Address Care Coordination Needs and Knowledge Deficits for Management of DM, obesity, HLD   Start Date: 04/22/2021  Expected End Date: 04/22/2022  Priority: High  Note:   Current Barriers:  Knowledge Deficits related to plan of care for management of HLD, DMII, and Obesity  Care Coordination needs related to Financial constraints related to limited income, Limited access to food, and Lacks knowledge of community resource: related to food resources,  paying monthly bills including rent, dental resources  Reached patient via phone to complete follow up assessment; call dropped after 11 minutes, called patient back and left message requesting she return call. Patient returned call at 11:36  am and follow up assessment completed.  RNCM Clinical Goal(s):  Patient will verbalize basic understanding of HLD, DMII, and obesity disease process and self health management plan as evidenced by improved Hgb A1C, weight loss or no weight gain, normal lipid panel take all medications exactly as prescribed and will call provider for medication related questions as evidenced by patient reporting improvement in taken medications as prescribed    demonstrate understanding of rationale for each prescribed medication as evidenced by patient able to report to provider purpose of medication    attend all scheduled medical appointments: with primary care provider on 05/28/21 as evidenced by no missed appointments when appointment history reviewed in medical record         demonstrate improved adherence to prescribed treatment plan for HLD, DMII, and Obesity as evidenced by improved Hgb A1C, weight loss of no weight gain, improvement in lipid panel continue to work with Medical illustrator and/or Social Worker to address care management and care coordination needs related to HLD, DMII, and Obesity as evidenced by adherence to CM Team Scheduled appointments     work with Child psychotherapist to address Financial constraints related to limited income and Limited access to food related to the management of HLD, DMII, and Obesity as evidenced by review of EMR and patient or Child psychotherapist report     through collaboration with Medical illustrator, provider, and care team.  Work with pharmacist to address inconsistent medication taking behavior and personal feeling regarding medications. i.e. "I don't like taking so many medications"  Interventions: Inter-disciplinary care team collaboration (see longitudinal plan of care) Evaluation of current treatment plan related to  self management and patient's adherence to plan as established by provider Ensured patient received [facility] Care Management calendar and health  information in the mail and the community resources e-mailed to her on 05/20/21 by the community care guide   Interdisciplinary Collaboration Interventions:  (Status: Goal on track:  NO.) Long Term Goal  - patient has not spoken with managed Medicaid BSW or pharmacist as of today due to patient's inability to be reached to arrange initial appointments  Collaborated with BSW to initiate plan of care to address needs related to Financial constraints related to limited income, Limited access to food, and Lacks knowledge of community resource: to address food and financial insecurity and dental resources  in patient with HLD, DMII, and Obesity Collaboration with Philip Aspen, Limmie Patricia, MD regarding development and update of comprehensive plan of care as evidenced by provider attestation and co-signature Inter-disciplinary care team collaboration   Advised patient she has initial telephone appointments with the managed Medicaid BSW [name] today at 1:30 pm and with pharmacist on 06/13/21 at 2:30 pm   Diabetes:  (Status: Goal on track: NO.) Long Term Goal - patient states she is due to take Trulicity today and is interested in referral to the [facility]  Lab Results  Component Value Date   HGBA1C 8.4 (A) 02/25/2021  Provided education to patient about basic DM disease process; Reviewed medications with patient and discussed importance of medication adherence;        Reviewed prescribed diet with patient CHO controlled; Discussed plans with patient for ongoing care management follow up and provided  patient with direct contact information for care management team;      Reviewed scheduled/upcoming provider appointments including: 08/25/21 with primary care provider;         Ensured patient received "Planning Healthy Meals" brochure that was mailed to patient's home address- provided opportunity for patient to ask questions/discussion Encouraged patient to review health plan  benefits including those related to behavioral health mobile app since patient is single mother of twin daughters age 63- patient states she received handbook for [facility] Managed Medicaid members  Hyperlipidemia:  (Status: Condition stable. Not addressed this visit.) Long Term Goal  Lab Results  Component Value Date   CHOL 113 08/21/2020   HDL 44.70 08/21/2020   LDLCALC 52 08/21/2020   TRIG 80.0 08/21/2020   CHOLHDL 3 08/21/2020     Medication review performed; medication list updated in electronic medical record.   Weight Loss:  (Status: Goal on track: NO.) Long term Goal- after extensive discussion of DM metabolic deficits including decreased satiety signaling to brain from intestines and newer medication  Terzepatide used to treat DM/ lipid/BP . Patient would like referral to [facility] Advised patient to discuss with primary care provider options regarding weight management;  Collaboration with provider for dietician referral;  Provided patient and/or caregiver with contact information about benefits offered by health plan to assist with weight management  (community resource or dietician); Discussed in detail [facility]  Messaged primary care provider since patient missed her appointment on 05/26/21 that she will be seeing patient on 5/8 in the office and again requested referral to the [place] [facility] for weight management assistance  Patient Goals/Self-Care Activities: Take medications as prescribed   Attend all scheduled provider appointments Call pharmacy for medication refills 3-7 days in advance of running out of medications Perform all self care activities independently  Perform IADL's (shopping, preparing meals, housekeeping, managing finances) independently Call provider office for new concerns or questions  Work with the social worker to address care coordination needs and will continue to work with the  clinical team to address health care and disease management related needs set goal weight fill half of plate with vegetables manage portion size switch to sugar-free drinks - call for medicine refill 2 or 3 days before it runs out - take all medications exactly as prescribed - call doctor with any symptoms you believe are related to your medicine - call doctor when you experience any new symptoms - go to all doctor appointments as scheduled - adhere to prescribed diet: CHO controlled -  discuss referral to [facility] with primary care provider

## 2021-06-02 NOTE — Patient Instructions (Signed)
Visit Information  [name]  - as a part of your Medicaid benefit, you are eligible for care management and care coordination services at no cost or copay. I was unable to reach you by phone today but would be happy to help you with your health related needs. Please feel free to call me @ [phone]   A member of the Managed Medicaid care management team will reach out to you again over the next 7 days.   [name], BSW, [place] [facility]   [place]  High Risk Managed Medicaid Team  [phone]

## 2021-06-02 NOTE — Patient Outreach (Signed)
Medicaid Managed Care   Nurse Care Manager Note  06/02/2021 Name:  Angelica Berg MRN:  [id number] DOB:  Jun 03, 1975  Angelica Berg is an 46 y.o. year old female who is a primary patient of Angelica Berg, Angelica Halsted, MD.  The [facility] Managed Care Coordination team was consulted for assistance with:    HLD DM Type 2 Obesity  [name] was given information about Medicaid Managed Care Coordination team services today. Angelica Berg Patient agreed to services and verbal consent obtained.  Engaged with patient by telephone for follow up visit in response to provider referral for case management and/or care coordination services.   Assessments/Interventions:  Review of past medical history, allergies, medications, health status, including review of consultants reports, laboratory and other test data, was performed as part of comprehensive evaluation and provision of chronic care management services.  SDOH (Social Determinants of Health) assessments and interventions performed:   Care Plan  No Known Allergies  Medications Reviewed Today     Reviewed by [name], RN (Registered Nurse) on 06/02/21 at 1125  Med List Status: <None>   Medication Order Taking? Sig Documenting Provider Last Dose Status Informant  atorvastatin (LIPITOR) 80 MG tablet [id number] No TAKE ONE TABLET BY MOUTH DAILY Angelica Berg, Angelica Halsted, MD Taking Active            Med Note [name], [name]   Tue Apr 22, 2021  1:52 PM) Takes at hs with Metformin and Lisinopril  Blood Glucose Monitoring Suppl (ACCU-CHEK GUIDE) w/Device KIT [id number] No 1 Device by Does not apply route daily. E11.9 [name], [name], MD Taking Active            Med Note [name], [name]   Tue Apr 22, 2021  1:53 PM) States she checks CBG prn in the morning and sometimes after meals  Blood Glucose Monitoring Suppl (ONETOUCH VERIO) w/Device KIT [id number] No 1 Device by Does not apply route as directed. [name], [name], MD Taking Active   ergocalciferol (VITAMIN D2) 1.25 MG (50000 UT) capsule [id number] No Take 1 capsule (50,000 Units total) by mouth once a week.  Patient not taking: Reported on 04/22/2021   Angelica Berg, Angelica Halsted, MD Not Taking Active            Med Note [name], [name] Apr 22, 2021  1:55 PM) Reviewed most recent Vit D level and discussed indications for taking Vit D, encouraged her to take the weekly medication when she injects Trulicity to help with adherence   glucose blood (ACCU-CHEK GUIDE) test strip [id number] No Use as instructed to test blood sugar 2 times daily E11.9 [name], [name], MD Taking Active   ibuprofen (ADVIL) 800 MG tablet [id number] No Take 1 tablet (800 mg total) by mouth every 8 (eight) hours as needed. [name], MD Taking Active   lisinopril (ZESTRIL) 5 MG tablet [id number]  Take 1 tablet (5 mg total) by mouth daily. Angelica Berg, Angelica Halsted, MD  Active   meloxicam [facility]) 15 MG tablet [id number] No Take 1 tablet (15 mg total) by mouth daily.  Patient not taking: Reported on 04/22/2021   [name], MD Not Taking Active            Med Note [name], [name] Apr 22, 2021  1:57 PM) Patient states she started the medication because she didn't know what it was for so discussed indications and encouraged her to  try medication to assess efficacy with managing right hand and elbow pain  metFORMIN (GLUCOPHAGE) 1000 MG tablet [id number] No Take 1 tablet (1,000 mg total) by mouth at bedtime. Angelica Berg, Angelica Halsted, MD Taking Active            Med Note [name], [name] Apr 22, 2021  1:57 PM) [name] at night with atorvastatin and lisinopril            Patient Active Problem List   Diagnosis Date Noted   Pain in left knee 05/23/2019   Morbid obesity ([place]) 08/30/2018   Vitamin D deficiency 08/30/2018   Hyperlipidemia associated with type 2 diabetes mellitus ([place]) 08/30/2018   Transaminitis 08/30/2018    Care Plan : RN Care  Manager Plan Of Care  Updates made by [name], RN since 06/02/2021 12:00 AM     Problem: Knowledge Deficit and Care Coordination Needs Related to Management of  DM, obesity and HLD   Priority: High     Long-Range Goal: Development of Plan Of Care to Address Care Coordination Needs and Knowledge Deficits for Management of DM, obesity, HLD   Start Date: 04/22/2021  Expected End Date: 04/22/2022  Priority: High  Note:   Current Barriers:  Knowledge Deficits related to plan of care for management of HLD, DMII, and Obesity  Care Coordination needs related to Financial constraints related to limited income, Limited access to food, and Lacks knowledge of community resource: related to food resources, paying monthly bills including rent, dental resources  Reached patient via phone to complete follow up assessment; call dropped after 11 minutes, called patient back and left message requesting she return call. Patient returned call at 11:36 am and follow up assessment completed.  RNCM Clinical Goal(s):  Patient will verbalize basic understanding of HLD, DMII, and obesity disease process and self health management plan as evidenced by improved Hgb A1C, weight loss or no weight gain, normal lipid panel take all medications exactly as prescribed and will call provider for medication related questions as evidenced by patient reporting improvement in taken medications as prescribed    demonstrate understanding of rationale for each prescribed medication as evidenced by patient able to report to provider purpose of medication    attend all scheduled medical appointments: with primary care provider on 05/28/21 as evidenced by no missed appointments when appointment history reviewed in medical record         demonstrate improved adherence to prescribed treatment plan for HLD, DMII, and Obesity as evidenced by improved Hgb A1C, weight loss of no weight gain, improvement in lipid panel continue to work with [name] and/or Social Worker to address care management and care coordination needs related to HLD, DMII, and Obesity as evidenced by adherence to CM Team Scheduled appointments     work with Education officer, museum to address Financial constraints related to limited income and Limited access to food related to the management of HLD, DMII, and Obesity as evidenced by review of EMR and patient or Education officer, museum report     through collaboration with Consulting civil engineer, provider, and care team.  Work with pharmacist to address inconsistent medication taking behavior and personal feeling regarding medications. i.e. "I don't like taking so many medications"  Interventions: Inter-disciplinary care team collaboration (see longitudinal plan of care) Evaluation of current treatment plan related to  self management and patient's adherence to plan as established by provider Ensured patient received [facility] calendar  and health information in the mail and the community resources e-mailed to her on 05/20/21 by the community care guide   Interdisciplinary Collaboration Interventions:  (Status: Goal on track:  NO.) Long Term Goal  - patient has not spoken with managed Medicaid BSW or pharmacist as of today due to patient's inability to be reached to arrange initial appointments  Collaborated with BSW to initiate plan of care to address needs related to Financial constraints related to limited income, Limited access to food, and Lacks knowledge of community resource: to address food and financial insecurity and dental resources  in patient with HLD, DMII, and Obesity Collaboration with Angelica Berg, Angelica Halsted, MD regarding development and update of comprehensive plan of care as evidenced by provider attestation and co-signature Inter-disciplinary care team collaboration   Advised patient she has initial telephone appointments with the managed Medicaid BSW [name] today at 1:30 pm and  with pharmacist on 06/13/21 at 2:30 pm   Diabetes:  (Status: Goal on track: NO.) Long Term Goal - patient states she is due to take Trulicity today and is interested in referral to the [facility] [place]  Lab Results  Component Value Date   HGBA1C 8.4 (A) 02/25/2021  Provided education to patient about basic DM disease process; Reviewed medications with patient and discussed importance of medication adherence;        Reviewed prescribed diet with patient CHO controlled; Discussed plans with patient for ongoing care management follow up and provided patient with direct contact information for care management team;      Reviewed scheduled/upcoming provider appointments including: 08/25/21 with primary care provider;         Ensured patient received "Planning Healthy Meals" brochure that was mailed to patient's home address- provided opportunity for patient to ask questions/discussion Encouraged patient to review health plan benefits including those related to behavioral health mobile app since patient is single mother of twin daughters age 105- patient states she received handbook for [facility] Managed Medicaid members  Hyperlipidemia:  (Status: Condition stable. Not addressed this visit.) Long Term Goal  Lab Results  Component Value Date   CHOL 113 08/21/2020   HDL 44.70 08/21/2020   LDLCALC 52 08/21/2020   TRIG 80.0 08/21/2020   CHOLHDL 3 08/21/2020     Medication review performed; medication list updated in electronic medical record.   Weight Loss:  (Status: Goal on track: NO.) Long term Goal- after extensive discussion of DM metabolic deficits including decreased satiety signaling to brain from intestines and newer medication  Terzepatide used to treat DM/ lipid/BP . Patient would like referral to [facility] [place] patient to discuss with primary care provider options regarding weight management;  Collaboration with provider for dietician referral;   Provided patient and/or caregiver with contact information about benefits offered by health plan to assist with weight management  (community resource or dietician); Discussed in detail [facility] [place] primary care provider since patient missed her appointment on 05/26/21 that she will be seeing patient on 5/8 in the office and again requested referral to the [place] [facility] [place] for weight management assistance  Patient Goals/Self-Care Activities: Take medications as prescribed   Attend all scheduled provider appointments Call pharmacy for medication refills 3-7 days in advance of running out of medications Perform all self care activities independently  Perform IADL's (shopping, preparing meals, housekeeping, managing finances) independently Call provider office for new concerns or questions  Work  with the social worker to address care coordination needs and will continue to work with the clinical team to address health care and disease management related needs set goal weight fill half of plate with vegetables manage portion size switch to sugar-free drinks - call for medicine refill 2 or 3 days before it runs out - take all medications exactly as prescribed - call doctor with any symptoms you believe are related to your medicine - call doctor when you experience any new symptoms - go to all doctor appointments as scheduled - adhere to prescribed diet: CHO controlled -  discuss referral to [facility] [place] with primary care provider     Follow Up:  Patient agrees to Care Plan and Follow-up.  Plan: The Managed Medicaid care management team will reach out to the patient again over the next 30 days.  Date/time of next scheduled RN care management/care coordination outreach:  06/23/21 at 11:0 am  Kelli Churn RN, CCM, [place] Management Coordinator - Managed [place] High  Risk [phone]

## 2021-06-11 ENCOUNTER — Telehealth: Payer: Self-pay | Admitting: Internal Medicine

## 2021-06-13 ENCOUNTER — Ambulatory Visit: Payer: Self-pay

## 2021-06-18 ENCOUNTER — Other Ambulatory Visit: Payer: Self-pay | Admitting: Internal Medicine

## 2021-06-18 DIAGNOSIS — E1121 Type 2 diabetes mellitus with diabetic nephropathy: Secondary | ICD-10-CM

## 2021-06-23 ENCOUNTER — Other Ambulatory Visit: Payer: Self-pay | Admitting: *Deleted

## 2021-06-23 NOTE — Patient Outreach (Signed)
chl 

## 2021-06-23 NOTE — Patient Outreach (Signed)
Medicaid Managed Care   Nurse Care Manager Note  06/23/2021 Name:  Angelica Berg MRN:  [id number] DOB:  Aug 17, 1975  Angelica Berg is an 46 y.o. year old female who is a primary patient of [name], [name], MD.  The [facility] Managed Care Coordination team was consulted for assistance with:    HLD DM Obesity  Ms. Pichon was given information about Medicaid Managed Care Coordination team services today. Angelica Berg Patient agreed to services and verbal consent obtained.  Engaged with patient by telephone for follow up visit in response to provider referral for case management and/or care coordination services.   Assessments/Interventions:  Review of past medical history, allergies, medications, health status, including review of consultants reports, laboratory and other test data, was performed as part of comprehensive evaluation and provision of chronic care management services.  SDOH (Social Determinants of Health) assessments and interventions performed:   Care Plan  No Known Allergies  Medications Reviewed Today     Reviewed by [name], RN (Registered Nurse) on 06/23/21 at [place] List Status: <None>   Medication Order Taking? Sig Documenting Provider Last Dose Status Informant  atorvastatin (LIPITOR) 80 MG tablet [id number] No TAKE ONE TABLET BY MOUTH DAILY [name], [name], MD Taking Active            Med Note [name], [name]   Tue Apr 22, 2021  1:52 PM) Takes at hs with Metformin and Lisinopril  Blood Glucose Monitoring Suppl (ACCU-CHEK GUIDE) w/Device KIT [id number] No 1 Device by Does not apply route daily. E11.9 [name], [name], MD Taking Active            Med Note [name], [name]   Tue Apr 22, 2021  1:53 PM) States she checks CBG prn in the morning and sometimes after meals  Blood Glucose Monitoring Suppl (ONETOUCH VERIO) w/Device KIT [id number] No 1 Device by Does not apply route as directed. [name], [name], MD  Taking Active   ergocalciferol (VITAMIN D2) 1.25 MG (50000 UT) capsule [id number] No Take 1 capsule (50,000 Units total) by mouth once a week.  Patient not taking: Reported on 04/22/2021   [name], [name], MD Not Taking Active            Med Note [name], [name] Apr 22, 2021  1:55 PM) Reviewed most recent Vit D level and discussed indications for taking Vit D, encouraged her to take the weekly medication when she injects Trulicity to help with adherence   glucose blood (ACCU-CHEK GUIDE) test strip [id number] No Use as instructed to test blood sugar 2 times daily E11.9 [name], [name], MD Taking Active   ibuprofen (ADVIL) 800 MG tablet [id number] No Take 1 tablet (800 mg total) by mouth every 8 (eight) hours as needed. [name], MD Taking Active   lisinopril (ZESTRIL) 5 MG tablet [id number]  Take 1 tablet (5 mg total) by mouth daily. [name], [name], MD  Active   meloxicam [facility]) 15 MG tablet [id number] No Take 1 tablet (15 mg total) by mouth daily.  Patient not taking: Reported on 04/22/2021   [name], MD Not Taking Active            Med Note [name], [name] Apr 22, 2021  1:57 PM) Patient states she started the medication because she didn't know what it was for so discussed indications and encouraged her to try medication  to assess efficacy with managing right hand and elbow pain  metFORMIN (GLUCOPHAGE) 1000 MG tablet [id number] No Take 1 tablet (1,000 mg total) by mouth at bedtime. [name], [name], MD Taking Active            Med Note [name], [name] Apr 22, 2021  1:57 PM) [name] at night with atorvastatin and lisinopril  TRULICITY 7.37 TG/6.2IR SOPN [id number]  INJECT 0.75 MG UNDER THE SKIN ONCE WEEKLY [name], [name], MD  Active             Patient Active Problem List   Diagnosis Date Noted   Pain in left knee 05/23/2019   Morbid obesity ([place]) 08/30/2018   Vitamin D deficiency 08/30/2018   Hyperlipidemia  associated with type 2 diabetes mellitus ([place]) 08/30/2018   Transaminitis 08/30/2018    Care Plan : RN Care Manager Plan Of Care  Updates made by [name], RN since 06/23/2021 12:00 AM     Problem: Knowledge Deficit and Care Coordination Needs Related to Management of  DM, obesity and HLD   Priority: High     Long-Range Goal: Development of Plan Of Care to Address Care Coordination Needs and Knowledge Deficits for Management of DM, obesity, HLD   Start Date: 04/22/2021  Expected End Date: 04/22/2022  Priority: High  Note:   Current Barriers:  Knowledge Deficits related to plan of care for management of HLD, DMII, and Obesity  Care Coordination needs related to Financial constraints related to limited income, Limited access to food, and Lacks knowledge of community resource: related to food resources, paying monthly bills including rent, dental resources  Reached patient via phone to complete follow up assessment. She is requesting dental providers in her area that accept Medicaid and new patients as she cannot find the resources provided to her by the care guide on 05/20/21. RNCM Clinical Goal(s):  Patient will verbalize basic understanding of HLD, DMII, and obesity disease process and self health management plan as evidenced by improved Hgb A1C, weight loss or no weight gain, normal lipid panel take all medications exactly as prescribed and will call provider for medication related questions as evidenced by patient reporting improvement in taken medications as prescribed    demonstrate understanding of rationale for each prescribed medication as evidenced by patient able to report to provider purpose of medication    attend all scheduled medical appointments: with primary care provider on 05/28/21 as evidenced by no missed appointments when appointment history reviewed in medical record         demonstrate improved adherence to prescribed treatment plan for HLD, DMII, and Obesity as  evidenced by improved Hgb A1C, weight loss of no weight gain, improvement in lipid panel continue to work with Consulting civil engineer and/or Social Worker to address care management and care coordination needs related to HLD, DMII, and Obesity as evidenced by adherence to CM Team Scheduled appointments     through collaboration with RN Care manager, provider, and care team.  Work with pharmacist to address inconsistent medication taking behavior and personal feeling regarding medications. i.e. "I don't like taking so many medications"  Interventions: Inter-disciplinary care team collaboration (see longitudinal plan of care) Evaluation of current treatment plan related to  self management and patient's adherence to plan as established by provider Ensured patient received [facility] calendar and health information in the mail and the community resources e-mailed to her on 05/20/21 by the community care  guide   Interdisciplinary Collaboration Interventions:  (Status: Goal on track:  NO.) Long Term Goal  - patient has not spoken with managed Medicaid BSW or pharmacist as of today due to patient's inability to be reached to arrange initial appointments  Collaborated with BSW to initiate plan of care to address needs related to Financial constraints related to limited income, Limited access to food, and Lacks knowledge of community resource: to address food and financial insecurity and dental resources  in patient with HLD, DMII, and Obesity Collaboration with [name], [name], MD regarding development and update of comprehensive plan of care as evidenced by provider attestation and co-signature Inter-disciplinary care team collaboration   Advised patient the Managed Medicaid scheduling care guide will contact her in the future to arrange telephone appointment  with the pharmacist now that  the vacated position has been filled as of 06/30/21   Diabetes:  (Status: Goal on  track: NO.) Long Term Goal - patient states she is taking Trulicity as prescribed and remains interested in referral to the [facility] [place]  Lab Results  Component Value Date   HGBA1C 8.4 (A) 02/25/2021  Provided education to patient about basic DM disease process; Reviewed medications with patient and discussed importance of medication adherence;        Reviewed prescribed diet with patient CHO controlled; Discussed plans with patient for ongoing care management follow up and provided patient with direct contact information for care management team;      Reviewed scheduled/upcoming provider appointments including: 08/25/21 with primary care provider;         Emailed patient list of dental providers that accept Medicaid and are taking new patients.   Hyperlipidemia:  (Status: Condition stable. Not addressed this visit.) Long Term Goal  Lab Results  Component Value Date   CHOL 113 08/21/2020   HDL 44.70 08/21/2020   LDLCALC 52 08/21/2020   TRIG 80.0 08/21/2020   CHOLHDL 3 08/21/2020     Medication review performed; medication list updated in electronic medical record.   Weight Loss:  (Status: Goal on track: NO.) Long term Goal- after extensive discussion of DM metabolic deficits including decreased satiety signaling to brain from intestines and newer medication  Terzepatide used to treat DM/ lipid/BP . Patient would like referral to [facility] [place] Encouraged patient to contact primary care provider and request referral to [place] [place] to assist her with weight management and glycemic control since provider did not respond to this RNCM's communication regarding the referral last month.     Patient Goals/Self-Care Activities: Take medications as prescribed   Attend all scheduled provider appointments Call pharmacy for medication refills 3-7 days in advance of running out of medications Perform all self care  activities independently  Perform IADL's (shopping, preparing meals, housekeeping, managing finances) independently Call provider office for new concerns or questions  set goal weight fill half of plate with vegetables manage portion size switch to sugar-free drinks - call for medicine refill 2 or 3 days before it runs out - take all medications exactly as prescribed - call doctor with any symptoms you believe are related to your medicine - call doctor when you experience any new symptoms - go to all doctor appointments as scheduled - adhere to prescribed diet: CHO controlled - discuss referral to [facility] [place] with primary care provider     Follow Up:  Patient agrees to Care Plan and Follow-up.  Plan: The Managed Medicaid care management team will reach out to the patient again over the next 30 days.  Date/time of next scheduled RN care management/care coordination outreach:  07/22/21 at 10:30 am  Kelli Churn RN, CCM, [place] Management Coordinator - Managed [place] High Risk [phone]

## 2021-06-23 NOTE — Patient Instructions (Signed)
Visit Information  Angelica Berg was given information about Medicaid Managed Care team care coordination services as a part of their [facility] Community Plan Medicaid benefit. Angelica Berg verbally consented to engagement with the [facility] Managed Care team.   If you are experiencing a medical emergency, please call 911 or report to your local emergency department or urgent care.   If you have a non-emergency medical problem during routine business hours, please contact your provider's office and ask to speak with a nurse.   For questions related to your [facility], please call: [phone] or visit the homepage here: [email or web address]  If you would like to schedule transportation through your [facility], please call the following number at least 2 days in advance of your appointment: [phone].  Rides for urgent appointments can also be made after hours by calling Member Services.  Call the Behavioral Health Crisis Line at [phone], at any time, 24 hours a day, 7 days a week. If you are in danger or need immediate medical attention call 911.  If you would like help to quit smoking, call 1-800-QUIT-NOW (312-703-6681) OR Espaol: 1-855-Djelo-Ya (2-263-335-4562) o para ms informacin haga clic aqu or Text READY to 563-893 to register via text  Angelica Berg - following are the goals we discussed in your visit today:   Goals Addressed   Take medications as prescribed   Attend all scheduled provider appointments Call pharmacy for medication refills 3-7 days in advance of running out of medications Perform all self care activities independently  Perform IADL's (shopping, preparing meals, housekeeping, managing finances) independently Call provider office for new concerns or questions  set goal weight fill half of plate with vegetables manage portion size switch to  sugar-free drinks - call for medicine refill 2 or 3 days before it runs out - take all medications exactly as prescribed - call doctor with any symptoms you believe are related to your medicine - call doctor when you experience any new symptoms - go to all doctor appointments as scheduled - adhere to prescribed diet: CHO controlled - discuss referral to [facility] with primary care provider   Patient verbalizes understanding of instructions and care plan provided today and agrees to view in MyChart. Active MyChart status confirmed with patient.    The Managed Medicaid care management team will reach out to the patient again over the next 30 days.   Angelica Mon RN, CCM, CDCES [place]   [facility] Care Management Coordinator - Managed [place] High Risk [phone]   Following is a copy of your plan of care:  Care Plan : RN Care Manager Plan Of Care  Updates made by Bary Richard, RN since 06/23/2021 12:00 AM     Problem: Knowledge Deficit and Care Coordination Needs Related to Management of  DM, obesity and HLD   Priority: High     Long-Range Goal: Development of Plan Of Care to Address Care Coordination Needs and Knowledge Deficits for Management of DM, obesity, HLD   Start Date: 04/22/2021  Expected End Date: 04/22/2022  Priority: High  Note:   Current Barriers:  Knowledge Deficits related to plan of care for management of HLD, DMII, and Obesity  Care Coordination needs related to Financial constraints related to limited income, Limited access to food, and Lacks knowledge of community resource: related to food resources, paying monthly bills including rent, dental resources  Reached patient via phone to complete follow  up assessment. She is requesting dental providers in her area that accept Medicaid and new patients as she cannot find the resources provided to her by the care guide on 05/20/21. RNCM Clinical Goal(s):  Patient will  verbalize basic understanding of HLD, DMII, and obesity disease process and self health management plan as evidenced by improved Hgb A1C, weight loss or no weight gain, normal lipid panel take all medications exactly as prescribed and will call provider for medication related questions as evidenced by patient reporting improvement in taken medications as prescribed    demonstrate understanding of rationale for each prescribed medication as evidenced by patient able to report to provider purpose of medication    attend all scheduled medical appointments: with primary care provider on 05/28/21 as evidenced by no missed appointments when appointment history reviewed in medical record         demonstrate improved adherence to prescribed treatment plan for HLD, DMII, and Obesity as evidenced by improved Hgb A1C, weight loss of no weight gain, improvement in lipid panel continue to work with Medical illustrator and/or Social Worker to address care management and care coordination needs related to HLD, DMII, and Obesity as evidenced by adherence to CM Team Scheduled appointments     through collaboration with RN Care manager, provider, and care team.  Work with pharmacist to address inconsistent medication taking behavior and personal feeling regarding medications. i.e. "I don't like taking so many medications"  Interventions: Inter-disciplinary care team collaboration (see longitudinal plan of care) Evaluation of current treatment plan related to  self management and patient's adherence to plan as established by provider Ensured patient received [facility] Care Management calendar and health information in the mail and the community resources e-mailed to her on 05/20/21 by the community care guide   Interdisciplinary Collaboration Interventions:  (Status: Goal on track:  NO.) Long Term Goal  - patient has not spoken with managed Medicaid BSW or pharmacist as of today due to patient's inability to be  reached to arrange initial appointments  Collaborated with BSW to initiate plan of care to address needs related to Financial constraints related to limited income, Limited access to food, and Lacks knowledge of community resource: to address food and financial insecurity and dental resources  in patient with HLD, DMII, and Obesity Collaboration with Philip Aspen, Limmie Patricia, MD regarding development and update of comprehensive plan of care as evidenced by provider attestation and co-signature Inter-disciplinary care team collaboration   Advised patient the Managed Medicaid scheduling care guide will contact her in the future to arrange telephone appointment  with the pharmacist now that  the vacated position has been filled as of 06/30/21   Diabetes:  (Status: Goal on track: NO.) Long Term Goal - patient states she is taking Trulicity as prescribed and remains interested in referral to the [facility]  Lab Results  Component Value Date   HGBA1C 8.4 (A) 02/25/2021  Provided education to patient about basic DM disease process; Reviewed medications with patient and discussed importance of medication adherence;        Reviewed prescribed diet with patient CHO controlled; Discussed plans with patient for ongoing care management follow up and provided patient with direct contact information for care management team;      Reviewed scheduled/upcoming provider appointments including: 08/25/21 with primary care provider;         Emailed patient list of dental providers that accept Medicaid and are taking new patients.  Hyperlipidemia:  (Status: Condition stable. Not addressed this visit.) Long Term Goal  Lab Results  Component Value Date   CHOL 113 08/21/2020   HDL 44.70 08/21/2020   LDLCALC 52 08/21/2020   TRIG 80.0 08/21/2020   CHOLHDL 3 08/21/2020     Medication review performed; medication list updated in electronic medical record.   Weight Loss:  (Status: Goal on  track: NO.) Long term Goal- after extensive discussion of DM metabolic deficits including decreased satiety signaling to brain from intestines and newer medication  Terzepatide used to treat DM/ lipid/BP . Patient would like referral to [facility] Encouraged patient to contact primary care provider and request referral to [facility] to assist her with weight management and glycemic control since provider did not respond to this RNCM's communication regarding the referral last month.     Patient Goals/Self-Care Activities: Take medications as prescribed   Attend all scheduled provider appointments Call pharmacy for medication refills 3-7 days in advance of running out of medications Perform all self care activities independently  Perform IADL's (shopping, preparing meals, housekeeping, managing finances) independently Call provider office for new concerns or questions  set goal weight fill half of plate with vegetables manage portion size switch to sugar-free drinks - call for medicine refill 2 or 3 days before it runs out - take all medications exactly as prescribed - call doctor with any symptoms you believe are related to your medicine - call doctor when you experience any new symptoms - go to all doctor appointments as scheduled - adhere to prescribed diet: CHO controlled - discuss referral to [facility] with primary care provider

## 2021-07-22 ENCOUNTER — Telehealth: Payer: Self-pay

## 2021-07-22 ENCOUNTER — Other Ambulatory Visit: Payer: Self-pay | Admitting: *Deleted

## 2021-07-22 DIAGNOSIS — E1169 Type 2 diabetes mellitus with other specified complication: Secondary | ICD-10-CM

## 2021-07-22 NOTE — Patient Instructions (Signed)
Visit Information ? ?Angelica Berg was given information about Medicaid Managed Care team care coordination services as a part [facility] their [facility]. Henrene Pastoroniecia D Termine verbally consented [facility].  ? ?If you are experiencing a medical emergency, please call 911 or report to your local emergency department or urgent care.  ? ?If you have a non-emergency medical problem during routine business hours, please contact your provider's office and ask to speak with a nurse.  ? ?For questions related to your [facility] Medicaid, please call: [phone] or visit the homepage here: [email or web address] ? ?If you would like to schedule transportation through your [facility] Medicaid, please call the following number at least 2 days in advance of your appointment: [phone]. ? Rides for urgent appointments can also be made after hours by calling Member Services. ? ?Call the Behavioral Health Crisis Line at [phone], at any time, 24 hours a day, 7 days a week. If you are in danger or need immediate medical attention call 911. ? ?If you would like help to quit smoking, call 1-800-QUIT-NOW (403-461-40361-([phone])768-9726) OR Espa?ol: 1-855-D?jelo-Ya 469 200 6933(1-(506)380-6742) o para m?s informaci?n haga clic aqu? or Text READY to 200-400 to register via text ? ?Angelica Berg - following are the goals we discussed in your visit today:  ? Goals Addressed   ?Take medications as prescribed   ?Attend all scheduled provider appointments ?Call pharmacy for medication refills 3-7 days in advance of running out of medications ?Perform all self care activities independently  ?Perform IADL's (shopping, preparing meals, housekeeping, managing finances) independently ?Call provider office for new concerns or questions  ?set goal weight ?fill half of plate with vegetables ?manage portion size ?switch to  sugar-free drinks ?- call for medicine refill 2 or 3 days before it runs out ?- take all medications exactly as prescribed ?- call doctor with any symptoms you believe are related to your medicine ?- call doctor when you experience any new symptoms ?- go to all doctor appointments as scheduled ?- adhere to prescribed diet: CHO controlled ?- discuss referral to [facility] with primary care provider ? ? ?Patient verbalizes understanding of instructions and care plan provided today and agrees to view in MyChart. Active MyChart status confirmed with patient.   ? ?The Managed Medicaid care management team will reach out to the patient again over the next 30 days.  ? ?Cranford MonJanet Braylen Denunzio RN, CCM, CDCES ?[place]  [facility] ?Care Management Coordinator - Managed Medicaid High Risk ?[phone]  ? ?Following is a copy of your plan of care:  ?Care Plan : RN Care Manager Plan Of Care  ?Updates made by Bary RichardHauser, Coltan Spinello S, RN since 07/22/2021 12:00 AM  ?  ? ?Problem: Knowledge Deficit and Care Coordination Needs Related to Management of  DM, obesity and HLD   ?Priority: High  ?  ? ?Long-Range Goal: Development of Plan Of Care to Address Care Coordination Needs and Knowledge Deficits for Management of DM, obesity, HLD   ?Start Date: 04/22/2021  ?Expected End Date: 04/22/2022  ?Priority: High  ?Note:   ?Current Barriers:  ?Knowledge Deficits related to plan of care for management of HLD, DMII, and Obesity  ?Care Coordination needs related to Financial constraints related to limited income, Limited access to food, and Lacks knowledge of community resource: related to food resources, paying monthly bills including rent, dental resources  ?Reached patient via phone to complete follow up  assessment. She states she received the e-mail from this [facility] with Medicaid dental providers. She states she has a court date on April 19th regarding her inability to pay her rent- states her Investment banker, corporate will not  allow her to make partial payments so she is behind in her rent payments and is again requesting resources. Asking if the resources provided to her by the community care guide in January can be resent.  ?RNCM Clinical Goal(s):  ?Patient will verbalize basic understanding of HLD, DMII, and obesity disease process and self health management plan as evidenced by improved Hgb A1C, weight loss or no weight gain, normal lipid panel ?take all medications exactly as prescribed and will call provider for medication related questions as evidenced by patient reporting improvement in taken medications as prescribed    ?demonstrate understanding of rationale for each prescribed medication as evidenced by patient able to report to provider purpose of medication    ?attend all scheduled medical appointments: with primary care provider on 05/28/21 as evidenced by no missed appointments when appointment history reviewed in medical record         ?demonstrate improved adherence to prescribed treatment plan for HLD, DMII, and Obesity as evidenced by improved Hgb A1C, weight loss of no weight gain, improvement in lipid panel ?continue to work with Medical illustrator and/or Social Worker to address care management and care coordination needs related to HLD, DMII, and Obesity as evidenced by adherence to CM Team Scheduled appointments     through collaboration with Medical illustrator, provider, and care team.  ?Work with pharmacist to address inconsistent medication taking behavior and personal feeling regarding medications. i.e. "I don't like taking so many medications" and appropriateness of adding Terzepatide to medication regimen in place of Trulicity ? ?Interventions: ?Inter-disciplinary care team collaboration (see longitudinal plan of care) ?Evaluation of current treatment plan related to  self management and patient's adherence to plan as established by provider ?Ensured patient received Medicaid dental resources via e-mail ?Advised  patient role of RNCM will change effective 08/18/21 ? ? ?Interdisciplinary Collaboration Interventions:  (Status: Goal on track:  NO.) Long Term Goal  - patient states she is in litigation with her property manger regarding overdue rent payments, says she is not allowed to make partial payments, next court date is 08/06/21, she is requesting resources that were given to her in January by the community care guide be resent ?Collaborated with BSW to initiate plan of care to address needs related to Financial constraints related to limited income, Limited access to food, and Lacks knowledge of community resource: to address food and financial insecurity and dental resources  in patient with HLD, DMII, and Obesity ?Collaboration with Philip Aspen, Limmie Patricia, MD regarding development and update of comprehensive plan of care as evidenced by provider attestation and co-signature ?Inter-disciplinary care team collaboration   ?Referral placed with the pharmacist to address medication taking behavior and patient's personal feelings of "I don't like taking so many medications"  ?Schedule initial telephone appointment with Managed Medicaid BSW [name] to address financial insecurities and patient's inability to pay rent ?Referral sent to community care guide requesting resources provided in January related to financial insecurity and utilities be resent to patient  ? ? ?Diabetes:  (Status: Goal on track: NO.) Long Term Goal - patient states she is taking Trulicity as prescribed and remains interested in referral to the [facility] ? ?Lab Results  ?Component Value Date  ? HGBA1C 8.4 (A)  02/25/2021  ?Provided education to patient about basic DM disease process; ?Reviewed medications with patient and discussed importance of medication adherence;        ?Reviewed prescribed diet with patient CHO controlled; ?Discussed plans with patient for ongoing care management follow up and provided patient with  direct contact information for care management team;      ?Reviewed scheduled/upcoming provider appointments including: 08/25/21 with primary care provider;         ? ? ?Hyperlipidemia:  (Status: Condition stable. Not a

## 2021-07-22 NOTE — Telephone Encounter (Signed)
? ?  Telephone encounter was:  Unsuccessful.  07/22/2021 ?Name: Angelica Berg MRN: [id number] DOB: Feb 10, 1976 ? ?Unsuccessful outbound call made today to assist with:  Financial Difficulties related to bills ? ?Outreach Attempt:  1st Attempt ? ?A HIPAA compliant voice message was left requesting a return call.  Instructed patient to call back at [phone] at their earliest convenience. ? ?Cameron Proud ?Care Guide, Embedded Care Coordination ?[facility]  Care management  ?[place] [place]  ?Main Phone: [phone]  E-mail: [email or web address]  ?Website: [email or web address] ? ? ? ?

## 2021-07-22 NOTE — Patient Outreach (Addendum)
?Medicaid Managed Care   ?Nurse Care Manager Note ? ?07/22/2021 ?Name:  Angelica Berg MRN:  [id number] DOB:  01-24-1976 ? ?Angelica Berg is an 46 y.o. year old female who is a primary patient of [name], [name], MD.  The [facility] Managed Care Coordination team was consulted for assistance with:    ?HLD ?DM ?Morbid Obesity ? ?Ms. Eick was given information about Medicaid Managed Care Coordination team services today. [name] agreed to services and verbal consent obtained. ? ?Engaged with patient by telephone for follow up visit in response to provider referral for case management and/or care coordination services.  ? ?Assessments/Interventions:  Review of past medical history, allergies, medications, health status, including review of consultants reports, laboratory and other test data, was performed as part of comprehensive evaluation and provision of chronic care management services. ? ?SDOH (Social Determinants of Health) assessments and interventions performed: ? ? ?Care Plan ? ?No Known Allergies ? ?Medications Reviewed Today   ? ? Reviewed by [name], RN (Registered Nurse) on 07/22/21 at 1101  Med List Status: <None>  ? ?Medication Order Taking? Sig Documenting Provider Last Dose Status Informant  ?atorvastatin (LIPITOR) 80 MG tablet [id number] Yes TAKE ONE TABLET BY MOUTH DAILY [name], [name], MD Taking Active   ?         ?Med Note [name], [name]   Tue Apr 22, 2021  1:52 PM) Takes at hs with Metformin and Lisinopril  ?Blood Glucose Monitoring Suppl (ACCU-CHEK GUIDE) w/Device KIT [id number] No 1 Device by Does not apply route daily. E11.9  ?Patient not taking: Reported on 07/22/2021  ? [name], [name], MD Not Taking Active   ?         ?Med Note [name], Larina Lieurance S   Tue Apr 22, 2021  1:53 PM) States she checks CBG prn in the morning and sometimes after meals  ?Blood Glucose Monitoring Suppl (ONETOUCH VERIO) w/Device KIT [id number]  1 Device by Does not  apply route as directed. [name], [name], MD  Active   ?ergocalciferol (VITAMIN D2) 1.25 MG (50000 UT) capsule [id number]  Take 1 capsule (50,000 Units total) by mouth once a week.  ?Patient not taking: Reported on 04/22/2021  ? [name], [name], MD  Active   ?         ?Med Note [name], [name]   Tue Apr 22, 2021  1:55 PM) Reviewed most recent Vit D level and discussed indications for taking Vit D, encouraged her to take the weekly medication when she injects Trulicity to help with adherence   ?glucose blood (ACCU-CHEK GUIDE) test strip [id number]  Use as instructed to test blood sugar 2 times daily E11.9 [name], [name], MD  Active   ?ibuprofen (ADVIL) 800 MG tablet [id number] Yes Take 1 tablet (800 mg total) by mouth every 8 (eight) hours as needed. [name], MD Taking Active   ?lisinopril (ZESTRIL) 5 MG tablet [id number] Yes Take 1 tablet (5 mg total) by mouth daily. [name], [name], MD Taking Active   ?meloxicam [facility]) 15 MG tablet [id number] No Take 1 tablet (15 mg total) by mouth daily.  ?Patient not taking: Reported on 04/22/2021  ? [name], MD Not Taking Active   ?         ?Med Note [name], [name]   Tue Apr 22, 2021  1:57 PM) Patient states she started the medication because she didn't know what it was  for so discussed indications and encouraged her to try medication to assess efficacy with managing right hand and elbow pain  ?metFORMIN (GLUCOPHAGE) 1000 MG tablet [id number] Yes Take 1 tablet (1,000 mg total) by mouth at bedtime. [name], [name], MD Taking Active   ?         ?Med Note [name], [name]   Tue Apr 22, 2021  1:57 PM) Takes at night with atorvastatin and lisinopril  ?TRULICITY 2.09 OB/0.9GG SOPN [id number] Yes INJECT 0.75 MG UNDER THE SKIN ONCE WEEKLY [name], [name], MD Taking Active   ? ?  ?  ? ?  ? ? ?Patient Active Problem List  ? Diagnosis Date Noted  ? Pain in left knee 05/23/2019  ? Morbid obesity ([place]) 08/30/2018  ?  Vitamin D deficiency 08/30/2018  ? Hyperlipidemia associated with type 2 diabetes mellitus ([place]) 08/30/2018  ? Transaminitis 08/30/2018  ? ? ? ?Care Plan : RN Care Manager Plan Of Care  ?Updates made by [name], RN since 07/22/2021 12:00 AM  ?  ? ?Problem: Knowledge Deficit and Care Coordination Needs Related to Management of  DM, obesity and HLD   ?Priority: High  ?  ? ?Long-Range Goal: Development of Plan Of Care to Address Care Coordination Needs and Knowledge Deficits for Management of DM, obesity, HLD   ?Start Date: 04/22/2021  ?Expected End Date: 04/22/2022  ?Priority: High  ?Note:   ?Current Barriers:  ?Knowledge Deficits related to plan of care for management of HLD, DMII, and Obesity  ?Care Coordination needs related to Financial constraints related to limited income, Limited access to food, and Lacks knowledge of community resource: related to food resources, paying monthly bills including rent, dental resources  ?Reached patient via phone to complete follow up assessment. She states she received the e-mail from this [facility] with Medicaid dental providers. She states she has a court date on April 19th regarding her inability to pay her rent- states her Secondary school teacher will not allow her to make partial payments so she is behind in her rent payments and is again requesting resources. Asking if the resources provided to her by the community care guide in January can be resent.  ?RNCM Clinical Goal(s):  ?Patient will verbalize basic understanding of HLD, DMII, and obesity disease process and self health management plan as evidenced by improved Hgb A1C, weight loss or no weight gain, normal lipid panel ?take all medications exactly as prescribed and will call provider for medication related questions as evidenced by patient reporting improvement in taken medications as prescribed    ?demonstrate understanding of rationale for each prescribed medication as evidenced by patient able to report to provider purpose  of medication    ?attend all scheduled medical appointments: with primary care provider on 05/28/21 as evidenced by no missed appointments when appointment history reviewed in medical record         ?demonstrate improved adherence to prescribed treatment plan for HLD, DMII, and Obesity as evidenced by improved Hgb A1C, weight loss of no weight gain, improvement in lipid panel ?continue to work with Consulting civil engineer and/or Social Worker to address care management and care coordination needs related to HLD, DMII, and Obesity as evidenced by adherence to CM Team Scheduled appointments     through collaboration with Consulting civil engineer, provider, and care team.  ?Work with pharmacist to address inconsistent medication taking behavior and personal feeling regarding medications. i.e. "I don't like taking so many medications" and appropriateness of  adding Tirzepatide to medication regimen in place of Trulicity ? ?Interventions: ?Inter-disciplinary care team collaboration (see longitudinal plan of care) ?Evaluation of current treatment plan related to  self management and patient's adherence to plan as established by provider ?Ensured patient received Medicaid dental resources via e-mail ?Advised patient role of RNCM will change effective 08/18/21 ? ? ?Interdisciplinary Collaboration Interventions:  (Status: Goal on track:  NO.) Long Term Goal  - patient states she is in litigation with her property manger regarding overdue rent payments, says she is not allowed to make partial payments, next court date is 08/06/21, she is requesting resources that were given to her in January by the community care guide be resent ?Collaborated with BSW to initiate plan of care to address needs related to Financial constraints related to limited income, Limited access to food, and Lacks knowledge of community resource: to address food and financial insecurity and dental resources  in patient with HLD, DMII, and Obesity ?Collaboration with [name], [name], MD regarding development and update of comprehensive plan of care as evidenced by provider attestation and co-signature ?Inter-disciplinary care team collaboration   ?Referral placed with th

## 2021-07-24 ENCOUNTER — Telehealth: Payer: Self-pay

## 2021-07-24 NOTE — Telephone Encounter (Signed)
? ?  Telephone encounter was:  Unsuccessful.  07/24/2021 ?Name: Angelica Berg MRN: [id number] DOB: March 26, 1976 ? ?Unsuccessful outbound call made today to assist with:  Financial Difficulties related to bills ? ?Outreach Attempt:  2nd Attempt ? ?A HIPAA compliant voice message was left requesting a return call.  Instructed patient to call back at [phone] at their earliest convenience. ? ?[name] ?Care Guide, Embedded Care Coordination ?[facility]  Care management  ?[place], [place] [address]  ?Main Phone: [phone]  E-mail: [email or web address]  ?Website: [email or web address] ? ? ? ?

## 2021-07-25 ENCOUNTER — Telehealth: Payer: Self-pay

## 2021-07-25 NOTE — Telephone Encounter (Signed)
? ?  Telephone encounter was:  Successful.  ?07/25/2021 ?Name: Angelica Berg MRN: [id number] DOB: 1975-04-26 ? ?Angelica Berg is a 46 y.o. year old female who is a primary care patient of Philip Aspen, Limmie Patricia, MD . The community resource team was consulted for assistance with Financial Difficulties related to bills ? ?Care guide performed the following interventions:  Pt advised she received my e-mail (Enclosed in e-mail: [facility] Emergency assistance, [employer] information and their calendar with their availability, Air traffic controller for food stamps if you have not yet applied, [employer], [employer] and Resources for [employer], [employer] assistance program, and a guide to accessing [employer] to help with your needs.) and is still looking over things. At this time, pt does not have any further questions or concerns and advised she will e-mail me or call me if she does. ? ?Follow Up Plan:  No further follow up planned at this time. The patient has been provided with needed resources. ? ?[name] ?Care Guide, Embedded Care Coordination ?[facility]  Care management  ?[place], [place] [address]  ?Main Phone: [phone]  E-mail: [email or web address]  ?Website: [email or web address] ? ? ? ?

## 2021-07-28 ENCOUNTER — Ambulatory Visit: Payer: Self-pay

## 2021-07-28 ENCOUNTER — Other Ambulatory Visit: Payer: Self-pay

## 2021-07-28 NOTE — Patient Instructions (Signed)
Visit Information ? ?Ms. Mcmurrey was given information about Medicaid Managed Care team care coordination services as a part of their [place] Medicaid benefit. [name] verbally consented to engagement with the [facility] Managed Care team.  ? ?If you are experiencing a medical emergency, please call 911 or report to your local emergency department or urgent care.  ? ?If you have a non-emergency medical problem during routine business hours, please contact your provider's office and ask to speak with a nurse.  ? ?For questions related to your [facility], please call: [phone] or visit the homepage here: [email or web address] ? ?If you would like to schedule transportation through your [facility], please call the following number at least 2 days in advance of your appointment: [phone]. ? Rides for urgent appointments can also be made after hours by calling Member Services. ? ?Call the [place] at [phone], at any time, 24 hours a day, 7 days a week. If you are in danger or need immediate medical attention call 911. ? ?If you would like help to quit smoking, call 1-800-QUIT-NOW 636-834-1071) OR Espa?ol: 1-855-D?jelo-Ya 541-362-2539) o para m?s informaci?n haga clic aqu? or Text READY to 200-400 to register via text ? ?[name] - following are the goals we discussed in your visit today:  ? Goals Addressed   ?None ?  ? ? ? ?Social Worker will follow up in 7 days .  ? ?[name], BSW, [place] ?[place]  ?High Risk Managed Medicaid Team  ?[phone]  ? ?Following is a copy of your plan of care:  ?Care Plan : [place]  ?Updates made by Ethelda Chick since 07/28/2021 12:00 AM  ?  ? ?Problem: Knowledge Deficit and Care Coordination Needs Related to Management of  DM, obesity and HLD   ?Priority:  High  ?  ? ?Long-Range Goal: Development of Plan Of Care to Address Care Coordination Needs and Knowledge Deficits for Management of DM, obesity, HLD   ?Start Date: 04/22/2021  ?Expected End Date: 04/22/2022  ?Priority: High  ?Note:   ?Current Barriers:  ?Knowledge Deficits related to plan of care for management of HLD, DMII, and Obesity  ?Care Coordination needs related to Financial constraints related to limited income, Limited access to food, and Lacks knowledge of community resource: related to food resources, paying monthly bills including rent, dental resources  ?Reached patient via phone to complete follow up assessment. She states she received the e-mail from this [facility] with Medicaid dental providers. She states she has a court date on April 19th regarding her inability to pay her rent- states her Secondary school teacher will not allow her to make partial payments so she is behind in her rent payments and is again requesting resources. Asking if the resources provided to her by the community care guide in January can be resent.  ?RNCM Clinical Goal(s):  ?Patient will verbalize basic understanding of HLD, DMII, and obesity disease process and self health management plan as evidenced by improved Hgb A1C, weight loss or no weight gain, normal lipid panel ?take all medications exactly as prescribed and will call provider for medication related questions as evidenced by patient reporting improvement in taken medications as prescribed    ?demonstrate understanding of rationale for each prescribed medication as evidenced by patient able to report to provider purpose of medication    ?attend all scheduled medical appointments: with primary  care provider on 05/28/21 as evidenced by no missed appointments when appointment history reviewed in medical record         ?demonstrate improved adherence to prescribed treatment plan for HLD, DMII, and Obesity as evidenced by improved Hgb A1C, weight loss of no weight gain, improvement in  lipid panel ?continue to work with Consulting civil engineer and/or Social Worker to address care management and care coordination needs related to HLD, DMII, and Obesity as evidenced by adherence to CM Team Scheduled appointments     through collaboration with Consulting civil engineer, provider, and care team.  ?Work with pharmacist to address inconsistent medication taking behavior and personal feeling regarding medications. i.e. "I don't like taking so many medications" and appropriateness of adding Tirzepatide to medication regimen in place of Trulicity ? ?Interventions: ?Inter-disciplinary care team collaboration (see longitudinal plan of care) ?Evaluation of current treatment plan related to  self management and patient's adherence to plan as established by provider ?Ensured patient received Medicaid dental resources via e-mail ?Advised patient role of RNCM will change effective 08/18/21 ? ? ?Interdisciplinary Collaboration Interventions:  (Status: Goal on track:  NO.) Long Term Goal  - patient states she is in litigation with her property manger regarding overdue rent payments, says she is not allowed to make partial payments, next court date is 08/06/21, she is requesting resources that were given to her in January by the community care guide be resent ?Collaborated with BSW to initiate plan of care to address needs related to Financial constraints related to limited income, Limited access to food, and Lacks knowledge of community resource: to address food and financial insecurity and dental resources  in patient with HLD, DMII, and Obesity ?Collaboration with [name], [name], MD regarding development and update of comprehensive plan of care as evidenced by provider attestation and co-signature ?Inter-disciplinary care team collaboration   ?Referral placed with the pharmacist to address medication taking behavior and patient's personal feelings of "I don't like taking so many medications"  ?Schedule initial telephone  appointment with Managed Medicaid BSW [name] to address financial insecurities and patient's inability to pay rent ?Referral sent to community care guide requesting resources provided in January related to financial insecurity and utilities be resent to patient  ?07/28/21: BSW completed telephone outreach with patient regarding rent and financial insecurity. BSW explained what happened with her rent and stated she is going back to court on 08/06/21. Patient stated that she does need assistance with her back rent and utilities. Patient states her rent is about 8,000 and utilities are about 1500. BSW provided patient with the phone number for legal aid and encouraged patient to try the resources provided by the care guide.  ? ? ?Diabetes:  (Status: Goal on track: NO.) Long Term Goal - patient states she is taking Trulicity as prescribed and remains interested in referral to the [facility] [place] ? ?Lab Results  ?Component Value Date  ? HGBA1C 8.4 (A) 02/25/2021  ?Provided education to patient about basic DM disease process; ?Reviewed medications with patient and discussed importance of medication adherence;        ?Reviewed prescribed diet with patient CHO controlled; ?Discussed plans with patient for ongoing care management follow up and provided patient with direct contact information for care management team;      ?Reviewed scheduled/upcoming provider appointments including: 08/25/21 with primary care provider;         ? ? ?Hyperlipidemia:  (Status: Condition stable. Not addressed this visit.) Long  Term Goal  ?Lab Results  ?Component Value Date  ? CHOL 113 08/21/2020  ? HDL 44.70 08/21/2020  ? [place] 52 08/21/2020  ? TRIG 80.0 08/21/2020  ? CHOLHDL 3 08/21/2020  ?  ? ?Medication review performed; medication list updated in electronic medical record.  ? ?Weight Loss:  (Status: Goal on track: NO.) Long term Goal-  Patient would like referral to [facility] [place] ?Reviewed DM  metabolic deficits including decreased satiety signaling to brain from intestines and newer medication Tirzepatide used to treat DM/ lipid/BP . ?Encouraged patient to discuss referral to [facility]

## 2021-07-28 NOTE — Patient Outreach (Signed)
?Medicaid Managed Care ?Social Work Note ? ?07/28/2021 ?Name:  Angelica Berg MRN:  [id number] DOB:  1976-03-22 ? ?Angelica Berg is an 46 y.o. year old female who is a primary patient of [name], [name], MD.  The [facility] Managed Care Coordination team was consulted for assistance with:  Community Resources  ? ?[name] was given information about Medicaid Managed Care Coordination team services today. Jeronimo Berg Patient agreed to services and verbal consent obtained. ? ?Engaged with patient  for by telephone forinitial visit in response to referral for case management and/or care coordination services.  ? ?Assessments/Interventions:  Review of past medical history, allergies, medications, health status, including review of consultants reports, laboratory and other test data, was performed as part of comprehensive evaluation and provision of chronic care management services. ? ?SDOH: (Social Determinant of Health) assessments and interventions performed: ?07/28/21: BSW completed telephone outreach with patient regarding rent and financial insecurity. BSW explained what happened with her rent and stated she is going back to court on 08/06/21. Patient stated that she does need assistance with her back rent and utilities. Patient states her rent is about 8,000 and utilities are about 1500. BSW provided patient with the phone number for legal aid and encouraged patient to try the resources provided by the care guide.  ? ?Advanced Directives Status:  Not addressed in this encounter. ? ?Care Plan ?                ?No Known Allergies ? ?Medications Reviewed Today   ? ? Reviewed by [name], RN (Registered Nurse) on 07/22/21 at 1101  Med List Status: <None>  ? ?Medication Order Taking? Sig Documenting Provider Last Dose Status Informant  ?atorvastatin (LIPITOR) 80 MG tablet [id number] Yes TAKE ONE TABLET BY MOUTH DAILY [name], [name], MD Taking Active   ?         ?Med Note Angelica Berg, Angelica Berg   Tue Apr 22, 2021  1:52 PM) Takes at hs with Metformin and Lisinopril  ?Blood Glucose Monitoring Suppl (ACCU-CHEK GUIDE) w/Device KIT [id number] No 1 Device by Does not apply route daily. E11.9  ?Patient not taking: Reported on 07/22/2021  ? [name], [name], MD Not Taking Active   ?         ?Med Note Angelica Berg, Angelica Berg   Tue Apr 22, 2021  1:53 PM) States she checks CBG prn in the morning and sometimes after meals  ?Blood Glucose Monitoring Suppl (ONETOUCH VERIO) w/Device KIT [id number]  1 Device by Does not apply route as directed. [name], [name], MD  Active   ?ergocalciferol (VITAMIN D2) 1.25 MG (50000 UT) capsule [id number]  Take 1 capsule (50,000 Units total) by mouth once a week.  ?Patient not taking: Reported on 04/22/2021  ? [name], [name], MD  Active   ?         ?Med Note Angelica Berg, Angelica Berg   Tue Apr 22, 2021  1:55 PM) Reviewed most recent Vit D level and discussed indications for taking Vit D, encouraged her to take the weekly medication when she injects Trulicity to help with adherence   ?glucose blood (ACCU-CHEK GUIDE) test strip [id number]  Use as instructed to test blood sugar 2 times daily E11.9 [name], [name], MD  Active   ?ibuprofen (ADVIL) 800 MG tablet [id number] Yes Take 1 tablet (800 mg total) by mouth every 8 (eight) hours as needed. [name], MD Taking Active   ?lisinopril (  ZESTRIL) 5 MG tablet [id number] Yes Take 1 tablet (5 mg total) by mouth daily. [name], [name], MD Taking Active   ?meloxicam [facility]) 15 MG tablet [id number] No Take 1 tablet (15 mg total) by mouth daily.  ?Patient not taking: Reported on 04/22/2021  ? [name], MD Not Taking Active   ?         ?Med Note Angelica Berg, Angelica Berg   Tue Apr 22, 2021  1:57 PM) Patient states she started the medication because she didn't know what it was for so discussed indications and encouraged her to try medication to assess efficacy with managing right hand and elbow pain  ?metFORMIN (GLUCOPHAGE)  1000 MG tablet [id number] Yes Take 1 tablet (1,000 mg total) by mouth at bedtime. [name], [name], MD Taking Active   ?         ?Med Note Angelica Berg, Angelica Berg   Tue Apr 22, 2021  1:57 PM) Takes at night with atorvastatin and lisinopril  ?TRULICITY 5.63 JS/9.7WY SOPN [id number] Yes INJECT 0.75 MG UNDER THE SKIN ONCE WEEKLY [name], [name], MD Taking Active   ? ?  ?  ? ?  ? ? ?Patient Active Problem List  ? Diagnosis Date Noted  ? Pain in left knee 05/23/2019  ? Morbid obesity ([place]) 08/30/2018  ? Vitamin D deficiency 08/30/2018  ? Hyperlipidemia associated with type 2 diabetes mellitus ([place]) 08/30/2018  ? Transaminitis 08/30/2018  ? ? ?Conditions to be addressed/monitored per PCP order:   rent ? ?Care Plan : RN Care Manager Plan Of Care  ?Updates made by Ethelda Chick since 07/28/2021 12:00 AM  ?  ? ?Problem: Knowledge Deficit and Care Coordination Needs Related to Management of  DM, obesity and HLD   ?Priority: High  ?  ? ?Long-Range Goal: Development of Plan Of Care to Address Care Coordination Needs and Knowledge Deficits for Management of DM, obesity, HLD   ?Start Date: 04/22/2021  ?Expected End Date: 04/22/2022  ?Priority: High  ?Note:   ?Current Barriers:  ?Knowledge Deficits related to plan of care for management of HLD, DMII, and Obesity  ?Care Coordination needs related to Financial constraints related to limited income, Limited access to food, and Lacks knowledge of community resource: related to food resources, paying monthly bills including rent, dental resources  ?Reached patient via phone to complete follow up assessment. She states she received the e-mail from this [facility] with Medicaid dental providers. She states she has a court date on April 19th regarding her inability to pay her rent- states her Secondary school teacher will not allow her to make partial payments so she is behind in her rent payments and is again requesting resources. Asking if the resources provided to her by the community care  guide in January can be resent.  ?RNCM Clinical Goal(Berg):  ?Patient will verbalize basic understanding of HLD, DMII, and obesity disease process and self health management plan as evidenced by improved Hgb A1C, weight loss or no weight gain, normal lipid panel ?take all medications exactly as prescribed and will call provider for medication related questions as evidenced by patient reporting improvement in taken medications as prescribed    ?demonstrate understanding of rationale for each prescribed medication as evidenced by patient able to report to provider purpose of medication    ?attend all scheduled medical appointments: with primary care provider on 05/28/21 as evidenced by no missed appointments when appointment history reviewed in medical record         ?  demonstrate improved adherence to prescribed treatment plan for HLD, DMII, and Obesity as evidenced by improved Hgb A1C, weight loss of no weight gain, improvement in lipid panel ?continue to work with Consulting civil engineer and/or Social Worker to address care management and care coordination needs related to HLD, DMII, and Obesity as evidenced by adherence to CM Team Scheduled appointments     through collaboration with Consulting civil engineer, provider, and care team.  ?Work with pharmacist to address inconsistent medication taking behavior and personal feeling regarding medications. i.e. "I don't like taking so many medications" and appropriateness of adding Tirzepatide to medication regimen in place of Trulicity ? ?Interventions: ?Inter-disciplinary care team collaboration (see longitudinal plan of care) ?Evaluation of current treatment plan related to  self management and patient'Berg adherence to plan as established by provider ?Ensured patient received Medicaid dental resources via e-mail ?Advised patient role of RNCM will change effective 08/18/21 ? ? ?Interdisciplinary Collaboration Interventions:  (Status: Goal on track:  NO.) Long Term Goal  - patient states she is  in litigation with her property manger regarding overdue rent payments, says she is not allowed to make partial payments, next court date is 08/06/21, she is requesting resources that were given to he

## 2021-08-01 NOTE — Patient Outreach (Signed)
?Medicaid Managed Care ?Social Work Note ? ?08/01/2021 ?Name:  Angelica Berg MRN:  [id number] DOB:  [birth year] ? ?Angelica Berg is an 46 y.o. year old female who is a primary patient of [name], [name], MD.  The [facility] Managed Care Coordination team was consulted for assistance with:  Community Resources  ? ?Angelica Berg was given information about Medicaid Managed Care Coordination team services today. Angelica Berg Patient agreed to services and verbal consent obtained. ? ?Engaged with patient  for by telephone forfollow up visit in response to referral for case management and/or care coordination services.  ? ?Assessments/Interventions:  Review of past medical history, allergies, medications, health status, including review of consultants reports, laboratory and other test data, was performed as part of comprehensive evaluation and provision of chronic care management services. ? ?SDOH: (Social Determinant of Health) assessments and interventions performed: ?BSW returned patients telephone call. She stated she spoke with CG and the email was not coming through. Patient stated she spoke with DSS and are case was closed. BSW informed patient she should reapply. Bsw will send a message to leadership about patient getting assistance ? ?Advanced Directives Status:  Not addressed in this encounter. ? ?Care Plan ?                ?No Known Allergies ? ?Medications Reviewed Today   ? ? Reviewed by [name], RN (Registered Nurse) on 07/22/21 at 1101  Med List Status: <None>  ? ?Medication Order Taking? Sig Documenting Provider Last Dose Status Informant  ?atorvastatin (LIPITOR) 80 MG tablet [id number] Yes TAKE ONE TABLET BY MOUTH DAILY [name], [name], MD Taking Active   ?         ?Med Note [name], [name]   Tue Apr 22, 2021  1:52 PM) Takes at hs with Metformin and Lisinopril  ?Blood Glucose Monitoring Suppl (ACCU-CHEK GUIDE) w/Device KIT [id number] No 1 Device by Does not apply route  daily. E11.9  ?Patient not taking: Reported on 07/22/2021  ? [name], [name], MD Not Taking Active   ?         ?Med Note [name], [name]   Tue Apr 22, 2021  1:53 PM) States she checks CBG prn in the morning and sometimes after meals  ?Blood Glucose Monitoring Suppl (ONETOUCH VERIO) w/Device KIT [id number]  1 Device by Does not apply route as directed. [name], [name], MD  Active   ?ergocalciferol (VITAMIN D2) 1.25 MG (50000 UT) capsule [id number]  Take 1 capsule (50,000 Units total) by mouth once a week.  ?Patient not taking: Reported on 04/22/2021  ? [name], [name], MD  Active   ?         ?Med Note [name], [name]   Tue Apr 22, 2021  1:55 PM) Reviewed most recent Vit D level and discussed indications for taking Vit D, encouraged her to take the weekly medication when she injects Trulicity to help with adherence   ?glucose blood (ACCU-CHEK GUIDE) test strip [id number]  Use as instructed to test blood sugar 2 times daily E11.9 [name], [name], MD  Active   ?ibuprofen (ADVIL) 800 MG tablet [id number] Yes Take 1 tablet (800 mg total) by mouth every 8 (eight) hours as needed. [name], MD Taking Active   ?lisinopril (ZESTRIL) 5 MG tablet [id number] Yes Take 1 tablet (5 mg total) by mouth daily. [name], [name], MD Taking Active   ?meloxicam [facility]) 15 MG tablet [id number]  No Take 1 tablet (15 mg total) by mouth daily.  ?Patient not taking: Reported on 04/22/2021  ? [name], MD Not Taking Active   ?         ?Med Note [name], [name]   Tue Apr 22, 2021  1:57 PM) Patient states she started the medication because she didn't know what it was for so discussed indications and encouraged her to try medication to assess efficacy with managing right hand and elbow pain  ?metFORMIN (GLUCOPHAGE) 1000 MG tablet [id number] Yes Take 1 tablet (1,000 mg total) by mouth at bedtime. [name], [name], MD Taking Active   ?         ?Med Note [name], [name]   Tue Apr 22, 2021   1:57 PM) Takes at night with atorvastatin and lisinopril  ?TRULICITY 1.69 IH/0.3UU SOPN [id number] Yes INJECT 0.75 MG UNDER THE SKIN ONCE WEEKLY [name], [name], MD Taking Active   ? ?  ?  ? ?  ? ? ?Patient Active Problem List  ? Diagnosis Date Noted  ? Pain in left knee 05/23/2019  ? Morbid obesity ([place]) 08/30/2018  ? Vitamin D deficiency 08/30/2018  ? Hyperlipidemia associated with type 2 diabetes mellitus ([place]) 08/30/2018  ? Transaminitis 08/30/2018  ? ? ?Conditions to be addressed/monitored per PCP order:   community resources ? ?Care Plan : RN Care Manager Plan Of Care  ?Updates made by Angelica Berg since 08/01/2021 12:00 AM  ?  ? ?Problem: Knowledge Deficit and Care Coordination Needs Related to Management of  DM, obesity and HLD   ?Priority: High  ?  ? ?Long-Range Goal: Development of Plan Of Care to Address Care Coordination Needs and Knowledge Deficits for Management of DM, obesity, HLD   ?Start Date: 04/22/2021  ?Expected End Date: 04/22/2022  ?Priority: High  ?Note:   ?Current Barriers:  ?Knowledge Deficits related to plan of care for management of HLD, DMII, and Obesity  ?Care Coordination needs related to Financial constraints related to limited income, Limited access to food, and Lacks knowledge of community resource: related to food resources, paying monthly bills including rent, dental resources  ?Reached patient via phone to complete follow up assessment. She states she received the e-mail from this [facility] with Medicaid dental providers. She states she has a court date on April 19th regarding her inability to pay her rent- states her Secondary school teacher will not allow her to make partial payments so she is behind in her rent payments and is again requesting resources. Asking if the resources provided to her by the community care guide in January can be resent.  ?RNCM Clinical Goal(s):  ?Patient will verbalize basic understanding of HLD, DMII, and obesity disease process and self health  management plan as evidenced by improved Hgb A1C, weight loss or no weight gain, normal lipid panel ?take all medications exactly as prescribed and will call provider for medication related questions as evidenced by patient reporting improvement in taken medications as prescribed    ?demonstrate understanding of rationale for each prescribed medication as evidenced by patient able to report to provider purpose of medication    ?attend all scheduled medical appointments: with primary care provider on 05/28/21 as evidenced by no missed appointments when appointment history reviewed in medical record         ?demonstrate improved adherence to prescribed treatment plan for HLD, DMII, and Obesity as evidenced by improved Hgb A1C, weight loss of no weight gain, improvement in lipid  panel ?continue to work with RN Care Manager and/or Social Worker to address care management and care coordination needs related to HLD, DMII, and Obesity as evidenced by adherence to CM Team Scheduled appointments     through collaboration with RN Care manager, provider, and care team.  ?Work with pharmacist to address inconsistent medication taking behavior and personal feeling regarding medications. i.e. "I don't like taking so many medications" and appropriateness of adding Tirzepatide to medication regimen in place of Trulicity ? ?Interventions: ?Inter-disciplinary care team collaboration (see longitudinal plan of care) ?Evaluation of current treatment plan related to  self management and patient's adherence to plan as established by provider ?Ensured patient received Medicaid dental resources via e-mail ?Advised patient role of RNCM will change effective 08/18/21 ? ? ?Interdisciplinary Collaboration Interventions:  (Status: Goal on track:  NO.) Long Term Goal  - patient states she is in litigation with her property manger regarding overdue rent payments, says she is not allowed to make partial payments, next court date is 08/06/21, she is  requesting resources that were given to her in January by the community care guide be resent ?Collaborated with BSW to initiate plan of care to address needs related to Financial constraints related to limited incom

## 2021-08-01 NOTE — Patient Instructions (Signed)
Visit Information ? ?Angelica Berg was given information about Medicaid Managed Care team care coordination services as a part of their [facility] Community Plan Medicaid benefit. Angelica Berg verbally consented to engagement with the [facility] Managed Care team.  ? ?If you are experiencing a medical emergency, please call 911 or report to your local emergency department or urgent care.  ? ?If you have a non-emergency medical problem during routine business hours, please contact your provider's office and ask to speak with a nurse.  ? ?For questions related to your [facility], please call: [phone] or visit the homepage here: [email or web address] ? ?If you would like to schedule transportation through your [facility], please call the following number at least 2 days in advance of your appointment: [phone]. ? Rides for urgent appointments can also be made after hours by calling Member Services. ? ?Call the Behavioral Health Crisis Line at [phone], at any time, 24 hours a day, 7 days a week. If you are in danger or need immediate medical attention call 911. ? ?If you would like help to quit smoking, call 1-800-QUIT-NOW (682-252-4440) OR Espa?ol: 1-855-D?jelo-Ya 407-724-6125) o para m?s informaci?n haga clic aqu? or Text READY to 200-400 to register via text ? ?[name] - following are the goals we discussed in your visit today:  ? Goals Addressed   ?None ?  ? ? ? ?Social Worker will follow up on 08/05/21.  ? ?[name], BSW, [place] ?[employer]  ?High Risk Managed Medicaid Team  ?[phone]  ? ?Following is a copy of your plan of care:  ?Care Plan : RN Care Manager Plan Of Care  ?Updates made by [name] since 08/01/2021 12:00 AM  ?  ? ?Problem: Knowledge Deficit and Care Coordination Needs Related to Management of  DM, obesity and HLD   ?Priority:  High  ?  ? ?Long-Range Goal: Development of Plan Of Care to Address Care Coordination Needs and Knowledge Deficits for Management of DM, obesity, HLD   ?Start Date: 04/22/2021  ?Expected End Date: 04/22/2022  ?Priority: High  ?Note:   ?Current Barriers:  ?Knowledge Deficits related to plan of care for management of HLD, DMII, and Obesity  ?Care Coordination needs related to Financial constraints related to limited income, Limited access to food, and Lacks knowledge of community resource: related to food resources, paying monthly bills including rent, dental resources  ?Reached patient via phone to complete follow up assessment. She states she received the e-mail from this [facility] with Medicaid dental providers. She states she has a court date on April 19th regarding her inability to pay her rent- states her Investment banker, corporate will not allow her to make partial payments so she is behind in her rent payments and is again requesting resources. Asking if the resources provided to her by the community care guide in January can be resent.  ?RNCM Clinical Goal(s):  ?Patient will verbalize basic understanding of HLD, DMII, and obesity disease process and self health management plan as evidenced by improved Hgb A1C, weight loss or no weight gain, normal lipid panel ?take all medications exactly as prescribed and will call provider for medication related questions as evidenced by patient reporting improvement in taken medications as prescribed    ?demonstrate understanding of rationale for each prescribed medication as evidenced by patient able to report to provider purpose of medication    ?attend all scheduled medical appointments: with primary care provider  on 05/28/21 as evidenced by no missed appointments when appointment history reviewed in medical record         ?demonstrate improved adherence to prescribed treatment plan for HLD, DMII, and Obesity as evidenced by improved Hgb A1C, weight loss of no weight gain, improvement in  lipid panel ?continue to work with Medical illustratorN Care Manager and/or Social Worker to address care management and care coordination needs related to HLD, DMII, and Obesity as evidenced by adherence to CM Team Scheduled appointments     through collaboration with Medical illustratorN Care manager, provider, and care team.  ?Work with pharmacist to address inconsistent medication taking behavior and personal feeling regarding medications. i.e. "I don't like taking so many medications" and appropriateness of adding Tirzepatide to medication regimen in place of Trulicity ? ?Interventions: ?Inter-disciplinary care team collaboration (see longitudinal plan of care) ?Evaluation of current treatment plan related to  self management and patient's adherence to plan as established by provider ?Ensured patient received Medicaid dental resources via e-mail ?Advised patient role of RNCM will change effective 08/18/21 ? ? ?Interdisciplinary Collaboration Interventions:  (Status: Goal on track:  NO.) Long Term Goal  - patient states she is in litigation with her property manger regarding overdue rent payments, says she is not allowed to make partial payments, next court date is 08/06/21, she is requesting resources that were given to her in January by the community care guide be resent ?Collaborated with BSW to initiate plan of care to address needs related to Financial constraints related to limited income, Limited access to food, and Lacks knowledge of community resource: to address food and financial insecurity and dental resources  in patient with HLD, DMII, and Obesity ?Collaboration with Philip AspenHernandez Acosta, Limmie PatriciaEstela Y, MD regarding development and update of comprehensive plan of care as evidenced by provider attestation and co-signature ?Inter-disciplinary care team collaboration   ?Referral placed with the pharmacist to address medication taking behavior and patient's personal feelings of "I don't like taking so many medications"  ?Schedule initial telephone  appointment with Managed Medicaid BSW Gus Pumalexis Lametria Klunk to address financial insecurities and patient's inability to pay rent ?Referral sent to community care guide requesting resources provided in January related to financial insecurity and utilities be resent to patient  ?07/28/21: BSW completed telephone outreach with patient regarding rent and financial insecurity. BSW explained what happened with her rent and stated she is going back to court on 08/06/21. Patient stated that she does need assistance with her back rent and utilities. Patient states her rent is about 8,000 and utilities are about 1500. BSW provided patient with the phone number for legal aid and encouraged patient to try the resources provided by the care guide.  ?08/01/21: BSW returned patients telephone call. She stated she spoke with CG and the email was not coming through. Patient stated she spoke with DSS and are case was closed. BSW informed patient she should reapply. Bsw will send a message to leadership about patient getting assistance.  ? ? ?Diabetes:  (Status: Goal on track: NO.) Long Term Goal - patient states she is taking Trulicity as prescribed and remains interested in [facility] to the [facility] ? ?Lab Results  ?Component Value Date  ? HGBA1C 8.4 (A) 02/25/2021  ?Provided education to patient about basic DM disease process; ?Reviewed medications with patient and discussed importance of medication adherence;        ?Reviewed prescribed diet with patient CHO controlled; ?Discussed plans with patient for ongoing care management follow up  and provided patient with direct contact information for care management team;      ?Reviewed scheduled/upcoming provider appointments including: 08/25/21 with primary care provider;         ? ? ?Hyperlipidemia:  (Status: Condition stable. Not addressed this visit.) Long Term Goal  ?Lab Results  ?Component Value Date  ? CHOL 113 08/21/2020  ? HDL 44.70 08/21/2020  ? LDLCALC 52  08/21/2020  ? TRIG 80.0 08/21/2020  ? CHOLHDL 3 08/21/2020  ?  ? ?Medication review performed; medication list updated in electronic medical record.  ? ?Weight Loss:  (Status: Goal on track: NO.) Long term Goal-

## 2021-08-04 ENCOUNTER — Other Ambulatory Visit: Payer: Self-pay

## 2021-08-04 ENCOUNTER — Other Ambulatory Visit: Payer: Self-pay | Admitting: Pharmacist

## 2021-08-04 DIAGNOSIS — E1169 Type 2 diabetes mellitus with other specified complication: Secondary | ICD-10-CM

## 2021-08-04 DIAGNOSIS — E1142 Type 2 diabetes mellitus with diabetic polyneuropathy: Secondary | ICD-10-CM

## 2021-08-04 NOTE — Patient Instructions (Signed)
It was great to speak with you today! ? ?Check your blood sugars twice daily:  ?1) Fasting, first thing in the morning before breakfast and  ?2) 2 hours after your largest meal.  ? ?For a goal A1c of less than 7%, goal fasting readings are less than 130 and goal 2 hour after meal readings are less than 180.  ? ?Talk to Dr. [name] about increasing your Trulicity dose. ? ?Check your blood pressure periodically . Our goal is less than 130/80 ? ?We recommend a blood pressure cuff that goes around your upper arm, as these are generally the most accurate.  ? ?To appropriately check your blood pressure, make sure you do the following:  ?1) Avoid caffeine, exercise, or tobacco products for 30 minutes before checking. ?2) Sit with your back supported in a flat-backed chair. Rest your arm on something flat (arm of the chair, table, etc). ?3) Sit still with your feet flat on the floor, resting, for at least 5 minutes.  ?4) Check your blood pressure. Take 1-2 readings.  ? ?Write down these readings and bring with you to any provider appointments. Bring your home blood pressure machine with you to a provider's office for accuracy comparison at least once a year. Make sure you take your blood pressure medications before you come to any office visit. ? ?Schedule follow up with Dr. Georgina Snell regarding your hand pain that has not improved with meloxicam.  ? ?Take care! ? ?Catie Hedwig Morton, PharmD, BCACP ?[place] ?[phone] ? ? ? ?Visit Information ? ?Ms. Babbitt was given information about Medicaid Managed Care team care coordination services as a part of their [place] Medicaid benefit. [name] verbally consented to engagement with the [facility] Managed Care team.  ? ?If you are experiencing a medical emergency, please call 911 or report to your local emergency department or urgent care.  ? ?If you have a non-emergency medical problem during routine business hours, please contact your  provider's office and ask to speak with a nurse.  ? ?For questions related to your [facility], please call: [phone] or visit the homepage here: [email or web address] ? ?If you would like to schedule transportation through your [facility], please call the following number at least 2 days in advance of your appointment: [phone]. ? Rides for urgent appointments can also be made after hours by calling Member Services. ? ?

## 2021-08-04 NOTE — Patient Outreach (Signed)
? ?   ? ?Chief Complaint  ?Patient presents with  ? High Risk Managed Medicaid  ? ? ?Angelica Berg is a 46 y.o. year old female who was referred for medication management by their primary care provider, [name], [name], MD. They presented for a telephone visit in the context of the COVID-19 pandemic. ?  ?They were referred to the pharmacist by their High Risk Managed Medicaid Care Team  for assistance in managing diabetes.  ? ?Subjective: ? ?Care Team: ?Primary Care Provider: [name], [name], MD ; Next Scheduled Visit: 08/25/21 ? ?Medication Access/Adherence ? ?Current Pharmacy:  ?[id number] , [place] - [address] [id number]?Phone: [phone]: [phone]? ?[id number] [address]?Phone: [phone]: [phone]? ? ?Patient reports affordability concerns with their medications: No  ?Patient reports access/transportation concerns to their pharmacy: No  ?Patient reports adherence concerns with their medications:  Yes  reports she sometimes forgets to take her Trulicity in the morning, and worries that she cannot take it at night because it will "mess with the other medications" ? ? ?Diabetes: ? ?Current medications: metformin 16144mg QPM, Trulicity 03.15mg weekly ? ?Current glucose readings: 2 hour post prandial: 237, no other recent readings ? ?Patient reports hyperglycemic symptoms including , polyphagia, nocturia. ? ?Current meal patterns:  ?- Reports she has not always been consistent with mealtimes, but is trying to do a better job. Goal of cutting back on starches ? 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 ?Hypertension: ? ?Current medications: lisinopril 5 mg daily ? ?Patient has a validated, automated, upper arm home BP cuff ?Current blood pressure readings  readings: not checking ? ? ?Hyperlipidemia/ASCVD Risk Reduction ? ?Current lipid lowering medications: atorvastatin 80 mg daily ? ?Pain: ?Current medications: meloxicam 15 mg daily - has been taking consistently since she last spoke to RN CM 2 weeeks ago, denies benefit ? ? ?Supplementation: ?- Vitamin D 50,000 units weekly - patient reports she is not taking consistently ? ?Health Maintenance ? ?Yearly diabetic eye exam: up to date ?Yearly diabetic foot exam: due ?Urine microalbumin: due ?Yearly influenza vaccination: up to date ?Td/Tdap vaccination: due ?COVID vaccinations: due ? ?Objective: ?Lab Results  ?Component Value Date  ? HGBA1C 8.4 (A) 02/25/2021  ? ? ?Lab Results  ?Component Value Date  ? CREATININE 0.60 08/21/2020  ? BUN 7 08/21/2020  ? NA 140 08/21/2020  ? K 4.3 08/21/2020  ? CL 105 08/21/2020  ? CO2 28 08/21/2020  ? ? ?Lab Results  ?Component Value Date  ? CHOL 113 08/21/2020  ? HDL 44.70 08/21/2020  ? L[place]52 08/21/2020  ? TRIG 80.0 08/21/2020  ? CHOLHDL 3 08/21/2020  ? ? ?Medications Reviewed Today   ? ? Reviewed by HOsker Mason RPH-CPP (Pharmacist) on 08/04/21 at 1108  Med List Status: <None>  ? ?Medication Order Taking? Sig Documenting Provider Last Dose Status Informant  ?atorvastatin (LIPITOR) 80 MG tablet [id number] TAKE ONE TABLET BY MOUTH DAILY HIsaac Bliss ERayford Halsted MD Taking Active   ?         ?Med Note ([name] Tomio Kirk T   Mon Aug 04, 2021 11:03 AM)    ?Blood Glucose Monitoring Suppl (ACCU-CHEK GUIDE) w/Device KIT [id number] 1 Device by Does not apply route daily. E11.9  ?Patient not  taking: Reported on 07/22/2021  ? [name], [name], MD  Active   ?         ?Med Note [name], Zayneb Baucum T   Mon Aug 04, 2021 11:03 AM)    ?Blood Glucose Monitoring Suppl (ONETOUCH VERIO) w/Device KIT [id number]  1 Device by Does not apply route as directed. [name], [name], MD  Active   ?ergocalciferol (VITAMIN D2) 1.25 MG (50000 UT) capsule [id number] No Take 1 capsule  (50,000 Units total) by mouth once a week.  ?Patient not taking: Reported on 04/22/2021  ? [name], [name], MD Not Taking Active   ?         ?Med Note [name], Davier Tramell T   Mon Aug 04, 2021 11:03 AM)    ?glucose blood (ACCU-CHEK GUIDE) test strip [id number]  Use as instructed to test blood sugar 2 times daily E11.9 [name], [name], MD  Active   ?ibuprofen (ADVIL) 800 MG tablet [id number] No Take 1 tablet (800 mg total) by mouth every 8 (eight) hours as needed.  ?Patient not taking: Reported on 08/04/2021  ? [name], MD Not Taking Active   ?lisinopril (ZESTRIL) 5 MG tablet [id number] Yes Take 1 tablet (5 mg total) by mouth daily. [name], [name], MD Taking Active   ?meloxicam [facility]) 15 MG tablet [id number] Yes Take 1 tablet (15 mg total) by mouth daily. [name], MD Taking Active   ?         ?Med Note [name], Adon Gehlhausen T   Mon Aug 04, 2021 11:03 AM)    ?metFORMIN (GLUCOPHAGE) 1000 MG tablet [id number] Yes Take 1 tablet (1,000 mg total) by mouth at bedtime. [name], [name], MD Taking Active   ?         ?Med Note [name], Sia Gabrielsen T   Mon Aug 04, 2021 11:03 AM)    ?TRULICITY 5.97 CB/6.3AG SOPN [id number] Yes INJECT 0.75 MG UNDER THE SKIN ONCE WEEKLY [name], [name], MD Taking Active   ? ?  ?  ? ?  ? ? ?Assessment/Plan:  ?Care Plan : Medication Management  ?Updates made by Osker Mason, RPH-CPP since 08/04/2021 12:00 AM  ?  ? ?Problem: Diabetes   ?  ? ?Long-Range Goal: A1c <7%   ?Note:   ?Current Barriers:  ?Unable to achieve control of diabetes  ? ?Patient Needs: ?Optimized therapy ? ?Patient Activities: ?Patient will:  ?- check glucose twice daily, document, and provide at future appointments ? ?  ? ? ?Diabetes: ?- Currently uncontrolled ?- Counseled that Trulicity can be taken in the evenings if that is beneficial for adherence ?- Reviewed long term cardiovascular and renal outcomes of uncontrolled blood sugar ?- Reviewed goal A1c, goal fasting, and  goal 2 hour post prandial glucose ?- Reviewed dietary modifications including reduction in carbohydrate ?- Recommend to increase Trulicity dose. Encouraged to discuss with PCP at upcoming visit.  ?- Recommend to check glucose twice daily ? ?Hypertension: ?- Currently uncontrolled ?- Counseled on role of ACEI in nephroprotection and BP ?- Reviewed long term cardiovascular and renal outcomes of uncontrolled blood pressure ?- Reviewed appropriate blood pressure monitoring technique and reviewed goal blood pressure. Recommended to check home blood pressure and heart rate periodically ?- Recommend to continue lisinopril 5 mg daily ? ?Hyperlipidemia/ASCVD Risk Reduction: ?- Currently controlled.  ?- Recommend to continue atorvastatin 80 mg daily ? ?Pain: ?- Currently uncontrolled ?- Recommend to schedule follow up with Dr. Georgina Snell. Patient verbalizes understanding ? ?Supplementation: ?-  Encouraged to discuss repeat Vit D with PCP at upcoming appointment to determine if prescription Vitamin D is still warranted ? ?Follow Up Plan: phone call in 6 weeks ? ?Catie Hedwig Morton, PharmD, BCACP ?[place] ?[phone] ? ? ? ?

## 2021-08-07 ENCOUNTER — Telehealth: Payer: Self-pay

## 2021-08-07 NOTE — Patient Instructions (Signed)
Visit Information ? ?Angelica Berg was given information about Medicaid Managed Care team care coordination services as a part of their [facility] Community Plan Medicaid benefit. Angelica Berg verbally consented to engagement with the [facility] Managed Care team.  ? ?If you are experiencing a medical emergency, please call 911 or report to your local emergency department or urgent care.  ? ?If you have a non-emergency medical problem during routine business hours, please contact your provider's office and ask to speak with a nurse.  ? ?For questions related to your [facility], please call: [phone] or visit the homepage here: [email or web address] ? ?If you would like to schedule transportation through your [facility], please call the following number at least 2 days in advance of your appointment: [phone]. ? Rides for urgent appointments can also be made after hours by calling Member Services. ? ?Call the Behavioral Health Crisis Line at [phone], at any time, 24 hours a day, 7 days a week. If you are in danger or need immediate medical attention call 911. ? ?If you would like help to quit smoking, call 1-800-QUIT-NOW (249-628-1938) OR Espa?ol: 1-855-D?jelo-Ya 215-034-5660) o para m?s informaci?n haga clic aqu? or Text READY to 200-400 to register via text ? ?Angelica Berg - following are the goals we discussed in your visit today:  ? Goals Addressed   ?None ?  ? ? ?Social Worker will follow up with patient in 7 days.  ? ?[name], BSW, MHA ?[employer]  ?High Risk Managed Medicaid Team  ?[phone]  ? ?Following is a copy of your plan of care:  ?There are no care plans that you recently modified to display for this patient. ?  ?

## 2021-08-07 NOTE — Patient Outreach (Signed)
?Medicaid Managed Care ?Social Work Note ? ?08/07/2021 ?Name:  Angelica Berg MRN:  [id number] DOB:  12-15-1975 ? ?Angelica Berg is an 46 y.o. year old female who is a primary patient of [name], [name], MD.  The [facility] Managed Care Coordination team was consulted for assistance with:  Community Resources  ? ?Angelica Berg was given information about Medicaid Managed Care Coordination team services today. Angelica Berg Patient agreed to services and verbal consent obtained. ? ?Engaged with patient  for by telephone forfollow up visit in response to referral for case management and/or care coordination services.  ? ?Assessments/Interventions:  Review of past medical history, allergies, medications, health status, including review of consultants reports, laboratory and other test data, was performed as part of comprehensive evaluation and provision of chronic care management services. ? ?SDOH: (Social Determinant of Health) assessments and interventions performed: ?BSW received a voicemail from patient, BSW contacted patient back, she stated she had court yesterday and the judge gave her 10 days to be out of the home. Patient stated her mom came down from [place] to assist with finding somewhere to stay. BSW provided patient with a rental company that she can try. BSW will reach out to leadership to see if any assistance can be given to patient for moving expenses. BSW will also see resources for moving expenses within the community. ? ?Advanced Directives Status:  Not addressed in this encounter. ? ?Care Plan ?                ?No Known Allergies ? ?Medications Reviewed Today   ? ? Reviewed by Osker Mason, RPH-CPP (Pharmacist) on 08/04/21 at 1108  Med List Status: <None>  ? ?Medication Order Taking? Sig Documenting Provider Last Dose Status Informant  ?atorvastatin (LIPITOR) 80 MG tablet [id number] Yes TAKE ONE TABLET BY MOUTH DAILY [name], [name], MD Taking Active   ?         ?Med  Note [name], [name]   Mon Aug 04, 2021 11:03 AM)    ?Blood Glucose Monitoring Suppl (ACCU-CHEK GUIDE) w/Device KIT [id number]  1 Device by Does not apply route daily. E11.9  ?Patient not taking: Reported on 07/22/2021  ? [name], [name], MD  Active   ?         ?Med Note [name], [name]   Mon Aug 04, 2021 11:03 AM)    ?Blood Glucose Monitoring Suppl (ONETOUCH VERIO) w/Device KIT [id number]  1 Device by Does not apply route as directed. [name], [name], MD  Active   ?ergocalciferol (VITAMIN D2) 1.25 MG (50000 UT) capsule [id number] No Take 1 capsule (50,000 Units total) by mouth once a week.  ?Patient not taking: Reported on 04/22/2021  ? [name], [name], MD Not Taking Active   ?         ?Med Note [name], [name]   Mon Aug 04, 2021 11:03 AM)    ?glucose blood (ACCU-CHEK GUIDE) test strip [id number]  Use as instructed to test blood sugar 2 times daily E11.9 [name], [name], MD  Active   ?ibuprofen (ADVIL) 800 MG tablet [id number] No Take 1 tablet (800 mg total) by mouth every 8 (eight) hours as needed.  ?Patient not taking: Reported on 08/04/2021  ? [name], MD Not Taking Active   ?lisinopril (ZESTRIL) 5 MG tablet [id number] Yes Take 1 tablet (5 mg total) by mouth daily. [name], [name], MD Taking Active   ?meloxicam [facility]) 15  MG tablet [id number] Yes Take 1 tablet (15 mg total) by mouth daily. [name], MD Taking Active   ?         ?Med Note [name], [name]   Mon Aug 04, 2021 11:03 AM)    ?metFORMIN (GLUCOPHAGE) 1000 MG tablet [id number] Yes Take 1 tablet (1,000 mg total) by mouth at bedtime. [name], [name], MD Taking Active   ?         ?Med Note [name], [name]   Mon Aug 04, 2021 11:03 AM)    ?TRULICITY 8.37 GB/0.2XJ SOPN [id number] Yes INJECT 0.75 MG UNDER THE SKIN ONCE WEEKLY [name], [name], MD Taking Active   ? ?  ?  ? ?  ? ? ?Patient Active Problem List  ? Diagnosis Date Noted  ? Pain in left knee 05/23/2019   ? Morbid obesity ([place]) 08/30/2018  ? Vitamin D deficiency 08/30/2018  ? Hyperlipidemia associated with type 2 diabetes mellitus ([place]) 08/30/2018  ? Transaminitis 08/30/2018  ? ? ?Conditions to be addressed/monitored per PCP order:   housing ? ?There are no care plans that you recently modified to display for this patient. ? ? ?Follow up:  Patient agrees to Care Plan and Follow-up. ? ?Plan: The Managed Medicaid care management team will reach out to the patient again over the next 7 days. ? ?Date/time of next scheduled Social Work care management/care coordination outreach:  08/18/21 ? ?[name], BSW, [place] ?[place]  ?High Risk Managed Medicaid Team  ?[phone]  ?

## 2021-08-18 ENCOUNTER — Other Ambulatory Visit: Payer: Self-pay

## 2021-08-18 ENCOUNTER — Other Ambulatory Visit: Payer: Self-pay | Admitting: Obstetrics and Gynecology

## 2021-08-18 NOTE — Patient Outreach (Signed)
Care Coordination ? ?08/18/2021 ? ?LAYLI CAPSHAW ?1975/07/09 ?[id number] ? ? ?Medicaid Managed Care  ? ?Unsuccessful Outreach Note ? ?08/18/2021 ?Name: TEA COLLUMS MRN: [id number] DOB: 1976/02/09 ? ?Referred by: Philip Aspen, Limmie Patricia, MD ?Reason for referral : High Risk Managed Medicaid (MM Social work unsuccessful telephone outreach) ? ? ?An unsuccessful telephone outreach was attempted today. The patient was referred to the case management team for assistance with care management and care coordination.  ? ?Follow Up Plan: The care management team will reach out to the patient again over the next 30 days.  ? ?[name], BSW, MHA ?[facility]  ?High Risk Managed Medicaid Team  ?[phone]  ?

## 2021-08-18 NOTE — Patient Outreach (Signed)
?Medicaid Managed Care   ?Nurse Care Manager Note ? ?08/18/2021 ?Name:  Angelica Berg MRN:  [id number] DOB:  1975-10-23 ? ?Angelica Berg is an 46 y.o. year old female who is a primary patient of [name], [name], MD.  The [facility] Managed Care Coordination team was consulted for assistance with:    ?Chronic healthcare management needs, DM, HLD ? ?[name] was given information about Medicaid Managed Care Coordination team services today. Angelica Berg Patient agreed to services and verbal consent obtained. ? ?Engaged with patient by telephone for follow up visit in response to provider referral for case management and/or care coordination services.  ? ?Assessments/Interventions:  Review of past medical history, allergies, medications, health status, including review of consultants reports, laboratory and other test data, was performed as part of comprehensive evaluation and provision of chronic care management services. ? ?SDOH (Social Determinants of Health) assessments and interventions performed: ?SDOH Interventions   ? ?Flowsheet Row Most Recent Value  ?SDOH Interventions   ?Stress Interventions Other (Comment)  [patient is speaking with MMSW]  ? ?  ? ?Care Plan ? ?No Known Allergies ? ?Medications Reviewed Today   ? ? Reviewed by [name], RN (Registered Nurse) on 08/18/21 at 1116  Med List Status: <None>  ? ?Medication Order Taking? Sig Documenting Provider Last Dose Status Informant  ?atorvastatin (LIPITOR) 80 MG tablet [id number] No TAKE ONE TABLET BY MOUTH DAILY [name], [name], MD Taking Active   ?         ?Med Note Angelica Berg, Angelica Berg   Mon Aug 04, 2021 11:03 AM)    ?Blood Glucose Monitoring Suppl (ACCU-CHEK GUIDE) w/Device KIT [id number] No 1 Device by Does not apply route daily. E11.9  ?Patient not taking: Reported on 07/22/2021  ? [name], Angelica Crazier, MD Not Taking Active   ?         ?Med Note Angelica Berg, Angelica Berg   Mon Aug 04, 2021 11:03 AM)    ?Blood Glucose  Monitoring Suppl (ONETOUCH VERIO) w/Device KIT [id number] No 1 Device by Does not apply route as directed. [name], Angelica Crazier, MD Taking Active   ?ergocalciferol (VITAMIN D2) 1.25 MG (50000 UT) capsule [id number] No Take 1 capsule (50,000 Units total) by mouth once a week.  ?Patient not taking: Reported on 04/22/2021  ? [name], [name], MD Not Taking Active   ?         ?Med Note Angelica Berg, Angelica Berg   Mon Aug 04, 2021 11:03 AM)    ?glucose blood (ACCU-CHEK GUIDE) test strip [id number] No Use as instructed to test blood sugar 2 times daily E11.9 [name], Angelica Crazier, MD Taking Active   ?ibuprofen (ADVIL) 800 MG tablet [id number] No Take 1 tablet (800 mg total) by mouth every 8 (eight) hours as needed.  ?Patient not taking: Reported on 08/04/2021  ? Angelica Hams, MD Not Taking Active   ?lisinopril (ZESTRIL) 5 MG tablet [id number] No Take 1 tablet (5 mg total) by mouth daily. [name], [name], MD Taking Active   ?meloxicam [facility]) 15 MG tablet [id number] No Take 1 tablet (15 mg total) by mouth daily. Angelica Hams, MD Taking Active   ?         ?Med Note Angelica Berg, Angelica Berg   Mon Aug 04, 2021 11:03 AM)    ?metFORMIN (GLUCOPHAGE) 1000 MG tablet [id number] No Take 1 tablet (1,000 mg total) by mouth at bedtime. [name], [name], MD Taking Active   ?         ?  Med Note Angelica Berg, Angelica Berg   Mon Aug 04, 2021 11:03 AM)    ?TRULICITY 9.52 WU/1.3KG SOPN [id number] No INJECT 0.75 MG UNDER THE SKIN ONCE WEEKLY [name], [name], MD Taking Active   ? ?  ?  ? ?  ? ?Patient Active Problem List  ? Diagnosis Date Noted  ? Pain in left knee 05/23/2019  ? Morbid obesity ([place]) 08/30/2018  ? Vitamin D deficiency 08/30/2018  ? Hyperlipidemia associated with type 2 diabetes mellitus ([place]) 08/30/2018  ? Transaminitis 08/30/2018  ? ?Conditions to be addressed/monitored per PCP order:  Chronic healthcare management needs, DM, HLD ? ?Care Plan : RN Care Manager Plan Of Care  ?Updates made by [name], RN since 08/18/2021 12:00 AM  ?  ? ?Problem: Knowledge Deficit and Care Coordination Needs Related to Management of  DM, obesity and HLD   ?Priority: High  ?  ? ?Long-Range Goal: Development of Plan Of Care to Address Care Coordination Needs and Knowledge Deficits for Management of DM, obesity, HLD   ?Start Date: 04/22/2021  ?Expected End Date: 04/22/2022  ?Priority: High  ?Note:   ?Current Barriers:  ?Knowledge Deficits related to plan of care for management of HLD, DMII, and Obesity  ?08/18/21:  Patient requesting dental resources-will refer to care guide.  Patient states she is checking blood sugars sporadically-ranging 179-240.  Patient has an appt with PCP 08/25/21 and will inquire about possible referral to [facility] [place].  She is hoping to hear today about a different house to move to. ?RNCM Clinical Goal(s):  ?Patient will verbalize basic understanding of HLD, DMII, and obesity disease process and self health management plan as evidenced by improved Hgb A1C, weight loss or no weight gain, normal lipid panel ?take all medications exactly as prescribed and will call provider for medication related questions as evidenced by patient reporting improvement in taken medications as prescribed    ?demonstrate understanding of rationale for each prescribed medication as evidenced by patient able to report to provider purpose of medication    ?attend all scheduled medical appointments: with primary care provider on 05/28/21 as evidenced by no missed appointments when appointment history reviewed in medical record         ?demonstrate improved adherence to prescribed treatment plan for HLD, DMII, and Obesity as evidenced by improved Hgb A1C, weight loss of no weight gain, improvement in lipid panel ?continue to work with Consulting civil engineer and/or Social Worker to address care management and care coordination needs related to HLD, DMII, and Obesity as evidenced by adherence to CM Team Scheduled appointments      through collaboration with Consulting civil engineer, provider, and care team.  ?Work with pharmacist to address inconsistent medication taking behavior and personal feeling regarding medications. i.e. "I don'Berg like taking so many medications" and appropriateness of adding Tirzepatide to medication regimen in place of Trulicity ? ?Interventions: ?Inter-disciplinary care team collaboration (see longitudinal plan of care) ?Evaluation of current treatment plan related to  self management and patient's adherence to plan as established by provider ?Care guide referral for dental resources ?Collaborated with Care Guide for resources. ? ?Interdisciplinary Collaboration Interventions:  (Status: Goal on track:  NO.) Long Term Goal  - patient states she is in litigation with her property manger regarding overdue rent payments, says she is not allowed to make partial payments, next court date is  ?Collaborated with BSW to initiate plan of care to address needs related to Financial constraints related  to limited income, Limited access to food, and Lacks knowledge of community resource: to address food and financial insecurity and dental resources  in patient with HLD, DMII, and Obesity ?Collaboration with [name], [name], MD regarding development and update of comprehensive plan of care as evidenced by provider attestation and co-signature ?Inter-disciplinary care team collaboration   ?Referral placed with the pharmacist to address medication taking behavior and patient's personal feelings of "I don'Berg like taking so many medications"  ?Schedule initial telephone appointment with Managed Medicaid BSW [name] to address financial insecurities and patient's inability to pay rent ?Referral sent to community care guide requesting resources provided in January related to financial insecurity and utilities be resent to patient  ?07/28/21: BSW completed telephone outreach with patient regarding rent and financial insecurity.  BSW explained what happened with her rent and stated she is going back to court on 08/06/21. Patient stated that she does need assistance with her back rent and utilities. Patient states her rent is abo

## 2021-08-18 NOTE — Patient Instructions (Signed)
Hi [name] nice to speak with you today!  I hope your day goes well:) ? ?Angelica Berg was given information about Medicaid Managed Care team care coordination services as a part of their [place] Medicaid benefit. Angelica Berg verbally consented to engagement with the [facility] Managed Care team.  ? ?If you are experiencing a medical emergency, please call 911 or report to your local emergency department or urgent care.  ? ?If you have a non-emergency medical problem during routine business hours, please contact your provider's office and ask to speak with a nurse.  ? ?For questions related to your [facility], please call: [phone] or visit the homepage here: [email or web address] ? ?If you would like to schedule transportation through your [facility], please call the following number at least 2 days in advance of your appointment: [phone]. ? Rides for urgent appointments can also be made after hours by calling Member Services. ? ?Call the [place] at [phone], at any time, 24 hours a day, 7 days a week. If you are in danger or need immediate medical attention call 911. ? ?If you would like help to quit smoking, call 1-800-QUIT-NOW 518-429-1527) OR Espa?ol: 1-855-D?jelo-Ya (901)769-1834) o para m?s informaci?n haga clic aqu? or Text READY to 200-400 to register via text ? ?Angelica Berg - following are the goals we discussed in your visit today:  ? Goals Addressed   ? ?Timeframe:  Long-Range Goal ?Priority:  High ?Start Date:     08/18/21                        ?Expected End Date: ongoing                     ? ?Follow Up Date 09/22/21  ?  ?- practice safe sex ?- schedule appointment for flu shot ?- schedule appointment for vaccines needed due to my age or health ?- schedule recommended health tests (blood work, mammogram, colonoscopy, pap test) ?-  schedule and keep appointment for annual check-up  ?  ?Why is this important?   ?Screening tests can find diseases early when they are easier to treat.  ?Your doctor or nurse will talk with you about which tests are important for you.  ?Getting shots for common diseases like the flu and shingles will help prevent them.   ? ?Patient verbalizes understanding of instructions and care plan provided today and agrees to view in [place]. Active MyChart status confirmed with patient.   ? ?The Managed Medicaid care management team will reach out to the patient again over the next 30 days.  ?The  Patient  has been provided with contact information for the Managed Medicaid care management team and has been advised to call with any health related questions or concerns.  ? ?[name] RN, BSN ?[facility] ?Care Management Coordinator - Managed Medicaid High Risk ?[phone] ?  ?Following is a copy of your plan of care:  ?Care Plan : [place]  ?Updates made by Angelica Medicus, RN since 08/18/2021 12:00 AM  ?  ? ?Problem: Knowledge Deficit and Care Coordination Needs Related to Management of  DM, obesity and HLD   ?Priority: High  ?  ? ?Long-Range Goal: Development of Plan Of Care to Address Care Coordination Needs and Knowledge Deficits for Management of DM, obesity, HLD   ?Start Date:  04/22/2021  ?Expected End Date: 04/22/2022  ?Priority: High  ?Note:   ?Current Barriers:  ?Knowledge Deficits related to plan of care for management of HLD, DMII, and Obesity  ?08/18/21:  Patient requesting dental resources-will refer to care guide.  Patient states she is checking blood sugars sporadically-ranging 179-240.  Patient has an appt with PCP 08/25/21 and will inquire about possible referral to [facility] [place].  She is hoping to hear today about a different house to move to. ?RNCM Clinical Goal(s):  ?Patient will verbalize basic understanding of HLD, DMII, and obesity disease  process and self health management plan as evidenced by improved Hgb A1C, weight loss or no weight gain, normal lipid panel ?take all medications exactly as prescribed and will call provider for medication related questions as evidenced by patient reporting improvement in taken medications as prescribed    ?demonstrate understanding of rationale for each prescribed medication as evidenced by patient able to report to provider purpose of medication    ?attend all scheduled medical appointments: with primary care provider on 05/28/21 as evidenced by no missed appointments when appointment history reviewed in medical record         ?demonstrate improved adherence to prescribed treatment plan for HLD, DMII, and Obesity as evidenced by improved Hgb A1C, weight loss of no weight gain, improvement in lipid panel ?continue to work with Consulting civil engineer and/or Social Worker to address care management and care coordination needs related to HLD, DMII, and Obesity as evidenced by adherence to CM Team Scheduled appointments     through collaboration with Consulting civil engineer, provider, and care team.  ?Work with pharmacist to address inconsistent medication taking behavior and personal feeling regarding medications. i.e. "I don't like taking so many medications" and appropriateness of adding Tirzepatide to medication regimen in place of Trulicity ? ?Interventions: ?Inter-disciplinary care team collaboration (see longitudinal plan of care) ?Evaluation of current treatment plan related to  self management and patient's adherence to plan as established by provider ?Care guide referral for dental resources ?Collaborated with Care Guide for resources. ? ?Interdisciplinary Collaboration Interventions:  (Status: Goal on track:  NO.) Long Term Goal  - patient states she is in litigation with her property manger regarding overdue rent payments, says she is not allowed to make partial payments, next court date is  ?Collaborated with BSW to  initiate plan of care to address needs related to Financial constraints related to limited income, Limited access to food, and Lacks knowledge of community resource: to address food and financial insecurity and dental resources  in patient with HLD, DMII, and Obesity ?Collaboration with [name], [name], MD regarding development and update of comprehensive plan of care as evidenced by provider attestation and co-signature ?Inter-disciplinary care team collaboration   ?Referral placed with the pharmacist to address medication taking behavior and patient's personal feelings of "I don't like taking so many medications"  ?Schedule initial telephone appointment with Managed Medicaid BSW [name] to address financial insecurities and patient's inability to pay rent ?Referral sent to community care guide requesting resources provided in January related to financial insecurity and utilities be resent to patient  ?07/28/21: BSW completed telephone outreach with patient regarding rent and financial insecurity. BSW explained what happened with her rent and stated she is going back to court on 08/06/21. Patient stated that she does need assistance with her back rent and utilities. Patient states her rent is about 8,000 and utilities are about 1500. BSW provided patient with  the phone number for legal aid and encouraged patient to try the resources provided by the care guide.  ?08/01/21: BSW returned patients telephone call. She stated she spoke with CG and the email was not coming through. Patient stated she spoke with DSS and are case was closed. BSW informed patient she should reapply. Bsw will send a message to leadership about patient getting assistance.  ? ? ?Diabetes:  (Status: Goal on track: NO.) Long Term Goal - patient states she is taking Trulicity as prescribed and remains interested in referral to the [facility] [place] ? ?Lab Results  ?Component Value Date  ? HGBA1C 8.4 (A) 02/25/2021   ?Provided education to patient about basic DM disease process; ?Reviewed medications with patient and discussed importance of medication adherence;        ?Reviewed prescribed diet with patient CHO controlled; ?[place]

## 2021-08-18 NOTE — Patient Instructions (Signed)
Visit Information ? ?[name]  - as a part of your Medicaid benefit, you are eligible for care management and care coordination services at no cost or copay. I was unable to reach you by phone today but would be happy to help you with your health related needs. Please feel free to call me @ [phone] ? ?A member of the Managed Medicaid care management team will reach out to you again over the next 30 days.  ? ?[name], BSW, MHA ?[facility]  ?High Risk Managed Medicaid Team  ?[phone]  ?

## 2021-08-19 ENCOUNTER — Other Ambulatory Visit: Payer: Self-pay | Admitting: Obstetrics and Gynecology

## 2021-08-19 DIAGNOSIS — E1142 Type 2 diabetes mellitus with diabetic polyneuropathy: Secondary | ICD-10-CM

## 2021-08-21 ENCOUNTER — Telehealth: Payer: Self-pay

## 2021-08-21 NOTE — Telephone Encounter (Signed)
? ?  Telephone encounter was:  Unsuccessful.  08/21/2021 ?Name: Angelica Berg MRN: [id number] DOB: 1975-07-19 ? ?Unsuccessful outbound call made today to assist with:   Dental ? ?Outreach Attempt:  1st Attempt ? ?A HIPAA compliant voice message was left requesting a return call.  Instructed patient to call back at [phone] at their earliest convenience. ? ?[name] ?Care Guide, Embedded Care Coordination ?[facility]  Care management  ?[place], [place] [address]  ?Main Phone: [phone]  E-mail: [email or web address]  ?Website: [email or web address] ? ? ? ?

## 2021-08-22 ENCOUNTER — Telehealth: Payer: Self-pay

## 2021-08-22 NOTE — Telephone Encounter (Signed)
? ?  Telephone encounter was:  Successful.  ?08/22/2021 ?Name: Angelica Berg MRN: [id number] DOB: 06-14-1975 ? ?Angelica Berg is a 46 y.o. year old female who is a primary care patient of Philip Aspen, Limmie Patricia, MD . The community resource team was consulted for assistance with  Dental ? ?Care guide performed the following interventions: Patient provided with information about care guide support team and interviewed to confirm resource needs. Patient advised she needed further dental information. CG sent information by e-mail per patient's request.Enclosed: Dental Resources-[place], Access to free for reduced cost dental care and [facility] ? ?Follow Up Plan:  No further follow up planned at this time. The patient has been provided with needed resources. ? ?Angelica Berg ?Care Guide, Embedded Care Coordination ?[facility]  Care management  ?[place], [place] [address]  ?Main Phone: [phone]  E-mail: Angelica Berg  ?Website: [email or web address] ? ? ? ?

## 2021-08-25 ENCOUNTER — Ambulatory Visit (INDEPENDENT_AMBULATORY_CARE_PROVIDER_SITE_OTHER): Payer: Medicaid Other | Admitting: Internal Medicine

## 2021-08-25 ENCOUNTER — Telehealth: Payer: Medicaid Other | Admitting: Internal Medicine

## 2021-08-25 ENCOUNTER — Encounter: Payer: Self-pay | Admitting: Internal Medicine

## 2021-08-25 ENCOUNTER — Other Ambulatory Visit: Payer: Self-pay

## 2021-08-25 VITALS — BP 122/88 | HR 85 | Temp 97.6°F | Wt 249.8 lb

## 2021-08-25 DIAGNOSIS — E559 Vitamin D deficiency, unspecified: Secondary | ICD-10-CM

## 2021-08-25 DIAGNOSIS — E785 Hyperlipidemia, unspecified: Secondary | ICD-10-CM | POA: Diagnosis not present

## 2021-08-25 DIAGNOSIS — E1169 Type 2 diabetes mellitus with other specified complication: Secondary | ICD-10-CM | POA: Diagnosis not present

## 2021-08-25 DIAGNOSIS — B3731 Acute candidiasis of vulva and vagina: Secondary | ICD-10-CM

## 2021-08-25 DIAGNOSIS — R7401 Elevation of levels of liver transaminase levels: Secondary | ICD-10-CM

## 2021-08-25 DIAGNOSIS — E1121 Type 2 diabetes mellitus with diabetic nephropathy: Secondary | ICD-10-CM | POA: Diagnosis not present

## 2021-08-25 LAB — POCT GLYCOSYLATED HEMOGLOBIN (HGB A1C): Hemoglobin A1C: 9.5 % — AB (ref 4.0–5.6)

## 2021-08-25 LAB — MICROALBUMIN / CREATININE URINE RATIO
Creatinine,U: 104.9 mg/dL
Microalb Creat Ratio: 5.7 mg/g (ref 0.0–30.0)
Microalb, Ur: 6 mg/dL — ABNORMAL HIGH (ref 0.0–1.9)

## 2021-08-25 MED ORDER — TRULICITY 1.5 MG/0.5ML ~~LOC~~ SOAJ: 1.5000 mg | SUBCUTANEOUS | 0 refills | Status: DC

## 2021-08-25 MED ORDER — FLUCONAZOLE 150 MG PO TABS: 150.0000 mg | ORAL_TABLET | Freq: Once | ORAL | 0 refills | Status: AC

## 2021-08-25 NOTE — Patient Instructions (Signed)
-  Nice seeing you today!! ? ?-Take diflucan 150 mg once. ? ?-Increase trulicity to 1.5 mg injected weekly. ? ?-Schedule follow up for your physical in 3 months. Please come in fasting. ?

## 2021-08-25 NOTE — Progress Notes (Signed)
? ? ? ?Established Patient Office Visit ? ? ? ? ?CC/Reason for Visit: Follow-up chronic medical conditions ? ?HPI: Angelica Berg is a 46 y.o. female who is coming in today for the above mentioned reasons. Past Medical History is significant for: Hypertension, hyperlipidemia, type 2 diabetes, morbid obesity.  She states she has been compliant with medication.  I have not seen her in 6 months.  She is complaining of vaginal itching. ? ? ?Past Medical/Surgical History: ?Past Medical History:  ?Diagnosis Date  ? Diabetes mellitus ([place])   ? Dyslipidemia   ? ? ?Past Surgical History:  ?Procedure Laterality Date  ? CESAREAN SECTION    ? KNEE ARTHROCENTESIS    ? ? ?Social History: ? reports that she quit smoking about 3 years ago. Her smoking use included cigarettes. She has never used smokeless tobacco. She reports that she does not currently use alcohol. She reports current drug use. Frequency: 7.00 times per week. Drug: Marijuana. ? ?Allergies: ?No Known Allergies ? ?Family History:  ?Family History  ?Problem Relation Age of Onset  ? Diabetes Mother   ? Hypertension Mother   ? Cancer Maternal Grandmother   ?     throat  ? Heart disease Maternal Grandmother   ? ? ? ?Current Outpatient Medications:  ?  atorvastatin (LIPITOR) 80 MG tablet, TAKE ONE TABLET BY MOUTH DAILY, Disp: 90 tablet, Rfl: 1 ?  Blood Glucose Monitoring Suppl (ACCU-CHEK GUIDE) w/Device KIT, 1 Device by Does not apply route daily. E11.9, Disp: 1 kit, Rfl: 0 ?  Blood Glucose Monitoring Suppl (ONETOUCH VERIO) w/Device KIT, 1 Device by Does not apply route as directed., Disp: 1 kit, Rfl: 0 ?  Dulaglutide (TRULICITY) 1.5 IN/8.6VE SOPN, Inject 1.5 mg into the skin once a week., Disp: 6 mL, Rfl: 0 ?  ergocalciferol (VITAMIN D2) 1.25 MG (50000 UT) capsule, Take 1 capsule (50,000 Units total) by mouth once a week., Disp: 12 capsule, Rfl: 0 ?  fluconazole (DIFLUCAN) 150 MG tablet, Take 1 tablet (150 mg total) by mouth once for 1 dose., Disp: 1 tablet, Rfl: 0 ?   glucose blood (ACCU-CHEK GUIDE) test strip, Use as instructed to test blood sugar 2 times daily E11.9, Disp: 100 each, Rfl: 12 ?  ibuprofen (ADVIL) 800 MG tablet, Take 1 tablet (800 mg total) by mouth every 8 (eight) hours as needed., Disp: 90 tablet, Rfl: 2 ?  lisinopril (ZESTRIL) 5 MG tablet, Take 1 tablet (5 mg total) by mouth daily., Disp: 90 tablet, Rfl: 1 ?  meloxicam (MOBIC) 15 MG tablet, Take 1 tablet (15 mg total) by mouth daily., Disp: 30 tablet, Rfl: 3 ?  metFORMIN (GLUCOPHAGE) 1000 MG tablet, Take 1 tablet (1,000 mg total) by mouth at bedtime., Disp: 180 tablet, Rfl: 1 ? ?Review of Systems:  ?Constitutional: Denies fever, chills, diaphoresis, appetite change and fatigue.  ?HEENT: Denies photophobia, eye pain, redness, hearing loss, ear pain, congestion, sore throat, rhinorrhea, sneezing, mouth sores, trouble swallowing, neck pain, neck stiffness and tinnitus.   ?Respiratory: Denies SOB, DOE, cough, chest tightness,  and wheezing.   ?Cardiovascular: Denies chest pain, palpitations and leg swelling.  ?Gastrointestinal: Denies nausea, vomiting, abdominal pain, diarrhea, constipation, blood in stool and abdominal distention.  ?Genitourinary: Denies dysuria, urgency, frequency, hematuria, flank pain and difficulty urinating.  ?Endocrine: Denies: hot or cold intolerance, sweats, changes in hair or nails, polyuria, polydipsia. ?Musculoskeletal: Denies myalgias, back pain, joint swelling, arthralgias and gait problem.  ?Skin: Denies pallor, rash and wound.  ?Neurological: Denies dizziness, seizures,  syncope, weakness, light-headedness, numbness and headaches.  ?Hematological: Denies adenopathy. Easy bruising, personal or family bleeding history  ?Psychiatric/Behavioral: Denies suicidal ideation, mood changes, confusion, nervousness, sleep disturbance and agitation ? ? ? ?Physical Exam: ?Vitals:  ? 08/25/21 1059  ?BP: 122/88  ?Pulse: 85  ?Temp: 97.6 ?F (36.4 ?C)  ?TempSrc: Oral  ?SpO2: 100%  ?Weight: 249 lb 12.8  oz (113.3 kg)  ? ? ?Body mass index is 35.84 kg/m?. ? ? ?Constitutional: NAD, calm, comfortable ?Eyes: PERRL, lids and conjunctivae normal, wears corrective lenses ?ENMT: Mucous membranes are moist. ?Respiratory: clear to auscultation bilaterally, no wheezing, no crackles. Normal respiratory effort. No accessory muscle use.  ?Cardiovascular: Regular rate and rhythm, no murmurs / rubs / gallops. No extremity edema.  ?Neurologic: Grossly intact and nonfocal ?Psychiatric: Normal judgment and insight. Alert and oriented x 3. Normal mood.  ? ? ?Impression and Plan: ? ?Type 2 diabetes mellitus with diabetic nephropathy, without long-term current use of insulin ([place]) ? - Plan: POCT glycosylated hemoglobin (Hb A1C), Dulaglutide (TRULICITY) 1.5 FY/9.2KM SOPN, Microalbumin / creatinine urine ratio ?-Uncontrolled with an A1c of 9.5 today, continue metformin, increase Trulicity to 1.5, return in 3 months. ?-Check microalbumin. ? ?Hyperlipidemia associated with type 2 diabetes mellitus ([place]) ?-Last LDL was 52 in May 2022, continue atorvastatin 80 mg daily. ? ?Morbid obesity ([place]) ?-Discussed healthy lifestyle, including increased physical activity and better food choices to promote weight loss. ? ?Transaminitis ?Vitamin D deficiency ?-Recheck levels when she returns for CPE. ? ?Vaginal yeast infection  ?- Plan: fluconazole (DIFLUCAN) 150 MG tablet ? ? ? ?Time spent:32 minutes reviewing chart, interviewing and examining patient and formulating plan of care. ? ? ?Patient Instructions  ?-Nice seeing you today!! ? ?-Take diflucan 150 mg once. ? ?-Increase trulicity to 1.5 mg injected weekly. ? ?-Schedule follow up for your physical in 3 months. Please come in fasting. ? ? ? ?Lelon Frohlich, MD ?[facility] ? ? ?

## 2021-09-03 ENCOUNTER — Telehealth: Payer: Self-pay | Admitting: Internal Medicine

## 2021-09-03 NOTE — Telephone Encounter (Signed)
Pt was seen last week and needs name brand diflucan the generic does not work and please sent to  ?[facility] [id number] - [place], [place] - [address] Phone:  [phone]  ?Fax:  [phone]  ?  ? ?

## 2021-09-04 ENCOUNTER — Telehealth: Payer: Self-pay | Admitting: Internal Medicine

## 2021-09-04 MED ORDER — DIFLUCAN 150 MG PO TABS: 150.0000 mg | ORAL_TABLET | Freq: Every day | ORAL | 0 refills | Status: DC

## 2021-09-04 NOTE — Telephone Encounter (Signed)
Rx sent.  Attempted to call patient, but unable to leave a message.

## 2021-09-04 NOTE — Telephone Encounter (Signed)
Spoke with Sharyl Nimrod and explained the patient requested brand name.  Sharyl Nimrod states they do not carry brand name and will let the patient know.

## 2021-09-04 NOTE — Telephone Encounter (Signed)
I have [facility] on the line wanting to know if they can switch patient Angelica Berg  script for DIFLUCAN 150 MG tablet to the generic?

## 2021-09-08 ENCOUNTER — Other Ambulatory Visit: Payer: Self-pay | Admitting: Pharmacist

## 2021-09-08 NOTE — Patient Instructions (Signed)
Angelica Berg,   It was great talking with you today!  Check your blood sugars twice daily:  1) Fasting, first thing in the morning before breakfast and  2) 2 hours after your largest meal.   For a goal A1c of less than 7%, goal fasting readings are less than 130 and goal 2 hour after meal readings are less than 180.   Your blood pressure and cholesterol were GREAT when you last saw Dr. Philip Aspen. Keep up the great work!  Check your blood pressure periodically, and any time you have concerning symptoms like headache, chest pain, dizziness, shortness of breath, or vision changes.   Our goal is less than 130/80.  To appropriately check your blood pressure, make sure you do the following:  1) Avoid caffeine, exercise, or tobacco products for 30 minutes before checking. Empty your bladder. 2) Sit with your back supported in a flat-backed chair. Rest your arm on something flat (arm of the chair, table, etc). 3) Sit still with your feet flat on the floor, resting, for at least 5 minutes.  4) Check your blood pressure. Take 1-2 readings.  5) Write down these readings and bring with you to any provider appointments.  Bring your home blood pressure machine with you to a provider's office for accuracy comparison at least once a year.   Make sure you take your blood pressure medications before you come to any office visit, even if you were asked to fast for labs.  Please reach out with any questions or concerns. Thanks!  Catie Eppie Gibson, PharmD, [facility] [phone]

## 2021-09-08 NOTE — Chronic Care Management (AMB) (Signed)
Chief Complaint  Patient presents with   High Risk Managed Medicaid    Angelica Berg is a 46 y.o. year old female who presented for a telephone visit.   They were referred to the pharmacist by their High Risk Managed Medicaid Care Team  for assistance in managing diabetes.   Patient is participating in a Managed Medicaid Plan:  Yes   Subjective:  Care Team: Primary Care Provider: [name], [name], MD ; Next Scheduled Visit: 11/25/21   Medication Access/Adherence  Current Pharmacy[facility]  [id number] [place] [address] [place]Phone: [phone]: [id number][address]: [phone]: [phone] [id number][place]Phone: [phone]: [phone]  Patient reports affordability concerns with their medications: No  Patient reports access/transportation concerns to their pharmacy: No  Patient reports adherence concerns with their medications:  No     Diabetes:  Current medications: metformin 19022mg daily, Trulicity 1.5 mg weekly - will increase with upcoming dose on Wednesday  A1c elevated at last visit with PCP.   Hypertension:  Current medications: lisinopril 5 mg daily  Patient has a validated, automated, upper arm home BP cuff   Hyperlipidemia/ASCVD Risk Reduction  Current lipid lowering medications: atorvastatin 80 mg daily   Health Maintenance  Health Maintenance Due  Topic Date Due   FOOT EXAM  Never done   HIV Screening  Never done   Hepatitis C Screening  Never done   PAP SMEAR-Modifier  08/10/2021     Objective: Lab Results  Component Value Date   HGBA1C 9.5 (A) 08/25/2021    Lab Results  Component Value Date    CREATININE 0.60 08/21/2020   BUN 7 08/21/2020   NA 140 08/21/2020   K 4.3 08/21/2020   CL 105 08/21/2020   CO2 28 08/21/2020    Lab Results  Component Value Date   CHOL 113 08/21/2020   HDL 44.70 08/21/2020   LDLCALC 52 08/21/2020   TRIG 80.0 08/21/2020   CHOLHDL 3 08/21/2020    Medications Reviewed Today     Reviewed by HOsker Mason RPH-CPP (Pharmacist) on 09/08/21 at 1005  Med List Status: <None>   Medication Order Taking? Sig Documenting Provider Last Dose Status Informant  atorvastatin (LIPITOR) 80 MG tablet [id number] TAKE ONE TABLET BY MOUTH DAILY HIsaac Bliss ERayford Halsted MD Taking Active            Med Note ([name] Shjon Lizarraga T   Mon Aug 04, 2021 11:03 AM)    Blood Glucose Monitoring Suppl (ACCU-CHEK GUIDE) w/Device KIT [id number] 1 Device by Does not apply route daily. E11.9 [name], IMelanie Crazier MD Taking Active            Med Note ([name] CToney ReilApr 17, 2023 11:03 AM)    Blood Glucose Monitoring Suppl ([facility]) w/Device KIT [id number] 1 Device by Does not apply route as directed. [name], IMelanie Crazier MD Taking Active   DIFLUCAN 150 MG tablet [id number] Take 1 tablet (150 mg total) by mouth daily. HIsaac Bliss ERayford Halsted MD Taking Active   Dulaglutide (TRULICITY) 1.5 MBT/9.4TNSBonney [id number] Inject 1.5 mg into the skin once  a week. [name], [name], MD Taking Active   ergocalciferol (VITAMIN D2) 1.25 MG (50000 UT) capsule [id number] Yes Take 1 capsule (50,000 Units total) by mouth once a week. [name], [name], MD Taking Active            Med Note [name], Mariann Palo T   Mon Aug 04, 2021 11:03 AM)    glucose blood (ACCU-CHEK GUIDE) test strip [id number] Yes Use as instructed to test blood sugar 2 times daily E11.9 [name], [name], MD Taking Active   ibuprofen (ADVIL) 800 MG tablet [id number]  Take 1 tablet (800 mg total) by mouth every 8 (eight) hours as needed. [name], MD   Active   lisinopril (ZESTRIL) 5 MG tablet [id number] Yes Take 1 tablet (5 mg total) by mouth daily. [name], [name], MD Taking Active   meloxicam [facility]) 15 MG tablet [id number] No Take 1 tablet (15 mg total) by mouth daily.  Patient not taking: Reported on 09/08/2021   [name], MD Not Taking Active            Med Note [name], Toney Reil Aug 04, 2021 11:03 AM)    metFORMIN (GLUCOPHAGE) 1000 MG tablet [id number] Yes Take 1 tablet (1,000 mg total) by mouth at bedtime. [name], [name], MD Taking Active            Med Note [name], Toney Reil Aug 04, 2021 11:03 AM)              Assessment/Plan:  Care Plan : Medication Management  Updates made by Osker Mason, RPH-CPP since 09/08/2021 12:00 AM     Problem: Diabetes      Long-Range Goal: A1c <7%   Note:   Current Barriers:  Unable to achieve control of diabetes   Patient Needs: Optimized therapy  Patient Activities: Patient will:  - check glucose twice daily, document, and provide at future appointments       Diabetes: - Currently uncontrolled but anticipate improvement - Reviewed goal A1c, goal fasting, and goal 2 hour post prandial glucose - Recommend to increase Trulicity as planned. Continue metformin. - Recommend to check glucose twice daily - fasting and 2 hour post prandial  Hypertension: - Currently controlled - Reviewed appropriate blood pressure monitoring technique and reviewed goal blood pressure. Recommended to check home blood pressure and heart rate periodically - Recommend to continue current regimen    Hyperlipidemia/ASCVD Risk Reduction: - Currently controlled.  - Recommend to continue current regimen  Follow Up Plan: phone call in 4 weeks  Catie TJodi Mourning, PharmD, [facility] [phone]

## 2021-09-09 ENCOUNTER — Telehealth: Payer: Self-pay | Admitting: Internal Medicine

## 2021-09-09 NOTE — Telephone Encounter (Signed)
Pt cannot get diflucan, pharmacy does not have it. Wants to know if there is a generic that she can use for relief. Pt states her vaginal area is raw and very uncomfortable.

## 2021-09-09 NOTE — Telephone Encounter (Signed)
Attempted to reach pt to ask about which pharmacy she would like for [place] to send. Left voicemail to call back.

## 2021-09-12 ENCOUNTER — Encounter: Payer: Self-pay | Admitting: Family

## 2021-09-12 ENCOUNTER — Telehealth (INDEPENDENT_AMBULATORY_CARE_PROVIDER_SITE_OTHER): Payer: Medicaid Other | Admitting: Family

## 2021-09-12 VITALS — Ht 70.0 in | Wt 249.5 lb

## 2021-09-12 DIAGNOSIS — B3731 Acute candidiasis of vulva and vagina: Secondary | ICD-10-CM

## 2021-09-12 MED ORDER — DIFLUCAN 150 MG PO TABS: 150.0000 mg | ORAL_TABLET | ORAL | 0 refills | Status: DC

## 2021-09-12 NOTE — Progress Notes (Signed)
MyChart Video Visit    Virtual Visit via Video Note   This visit type was conducted due to national recommendations for restrictions regarding the COVID-19 Pandemic (e.g. social distancing) in an effort to limit this patient's exposure and mitigate transmission in our community. This patient is at least at moderate risk for complications without adequate follow up. This format is felt to be most appropriate for this patient at this time. Physical exam was limited by quality of the video and audio technology used for the visit. CMA was able to get the patient set up on a video visit.  Patient location: Home. Patient and provider in visit Provider location: Office  I discussed the limitations of evaluation and management by telemedicine and the availability of in person appointments. The patient expressed understanding and agreed to proceed.  Visit Date: 09/12/2021  Today's healthcare provider: Jeanie Sewer, NP     Subjective:    Patient ID: Angelica Berg, female    DOB: June 07, 1975, 46 y.o.   MRN: [id number]  Chief Complaint  Patient presents with   Vaginitis    Pt was diagnosed with a yeast infection on 5/18. Pt was given Diflucan but pharmacy stated they do not give diflucan anymore. Vaginal itching, redness.    HPI Vaginitis: Patient complains of an abnormal vaginal discharge for 2 weeks, tried the generic Fluconazole, but states this never works for her, she has to have the brand Diflucan, but [facility].  Vaginal symptoms include no odor, reports itching, and burning pain. STI Risk/HX:  N/A Discharge described as: thick, thin, color Other associated symptoms: pelvic pain Menstrual pattern: regular   Assessment & Plan:   Problem List Items Addressed This Visit   None Visit Diagnoses     Vaginal yeast infection    -  Primary pt seen in office 2 weeks ago, took Fluconazole x 1 pill with no relief in sx, asked her PCP to send brand name  Diflucan, but she has been unable to get from her pharmacy or any [facility]. Advised pt I will try to send brand again, but her insurance would probably require a prior auth even if it was in stock. She should try the Fluconazole x 2 pills 2 days apart and see if sx are any better. Also stressed importance of getting her BS down to prevent yeast episodes.    Relevant Medications   DIFLUCAN 150 MG tablet      Past Medical History:  Diagnosis Date   Diabetes mellitus ([place])    Dyslipidemia     Past Surgical History:  Procedure Laterality Date   CESAREAN SECTION     KNEE ARTHROCENTESIS      Outpatient Medications Prior to Visit  Medication Sig Dispense Refill   atorvastatin (LIPITOR) 80 MG tablet TAKE ONE TABLET BY MOUTH DAILY 90 tablet 1   Blood Glucose Monitoring Suppl (ACCU-CHEK GUIDE) w/Device KIT 1 Device by Does not apply route daily. E11.9 1 kit 0   Blood Glucose Monitoring Suppl (ONETOUCH VERIO) w/Device KIT 1 Device by Does not apply route as directed. 1 kit 0   Dulaglutide (TRULICITY) 1.5 TM/9.3JP SOPN Inject 1.5 mg into the skin once a week. 6 mL 0   ergocalciferol (VITAMIN D2) 1.25 MG (50000 UT) capsule Take 1 capsule (50,000 Units total) by mouth once a week. 12 capsule 0   glucose blood (ACCU-CHEK GUIDE) test strip Use as instructed to test blood sugar 2 times daily E11.9 100 each 12  ibuprofen (ADVIL) 800 MG tablet Take 1 tablet (800 mg total) by mouth every 8 (eight) hours as needed. 90 tablet 2   lisinopril (ZESTRIL) 5 MG tablet Take 1 tablet (5 mg total) by mouth daily. 90 tablet 1   meloxicam (MOBIC) 15 MG tablet Take 1 tablet (15 mg total) by mouth daily. 30 tablet 3   metFORMIN (GLUCOPHAGE) 1000 MG tablet Take 1 tablet (1,000 mg total) by mouth at bedtime. 180 tablet 1   DIFLUCAN 150 MG tablet Take 1 tablet (150 mg total) by mouth daily. 1 tablet 0   No facility-administered medications prior to visit.    No Known Allergies     Objective:     Physical  Exam Vitals and nursing note reviewed.  Constitutional:      General: She is not in acute distress.    Appearance: Normal appearance.  HENT:     Head: Normocephalic.  Pulmonary:     Effort: No respiratory distress.  Musculoskeletal:     Cervical back: Normal range of motion.  Skin:    General: Skin is dry.     Coloration: Skin is not pale.  Neurological:     Mental Status: She is alert and oriented to person, place, and time.  Psychiatric:        Mood and Affect: Mood normal.   Ht '5\' 10"'  (1.778 m)   Wt 249 lb 8 oz (113.2 kg)   BMI 35.80 kg/m   Wt Readings from Last 3 Encounters:  09/12/21 249 lb 8 oz (113.2 kg)  08/25/21 249 lb 12.8 oz (113.3 kg)  02/25/21 250 lb 9.6 oz (113.7 kg)      I discussed the assessment and treatment plan with the patient. The patient was provided an opportunity to ask questions and all were answered. The patient agreed with the plan and demonstrated an understanding of the instructions.   The patient was advised to call back or seek an in-person evaluation if the symptoms worsen or if the condition fails to improve as anticipated.  I provided 22 minutes of face-to-face time during this encounter.  Jeanie Sewer, NP [place] [phone] (phone) [phone] (fax)  [place]

## 2021-09-18 ENCOUNTER — Other Ambulatory Visit: Payer: Self-pay

## 2021-09-18 NOTE — Patient Instructions (Signed)
Visit Information  [name]  - as a part of your Medicaid benefit, you are eligible for care management and care coordination services at no cost or copay. I was unable to reach you by phone today but would be happy to help you with your health related needs. Please feel free to call me @ [phone]   [name], BSW, [facility]  [facility]  High Risk Managed Medicaid Team  [phone]

## 2021-09-18 NOTE — Patient Outreach (Signed)
Care Coordination  09/18/2021  Angelica Berg 12-06-1975 [id number]   Medicaid Managed Care   Unsuccessful Outreach Note  09/18/2021 Name: Angelica Berg MRN: [id number] DOB: [birth year]  Referred by: Philip Aspen, Limmie Patricia, MD Reason for referral : High Risk Managed Medicaid (MM Social Work Unsuccessful Telephone Outreach)   Third unsuccessful telephone outreach was attempted today. The patient was referred to the case management team for assistance with care management and care coordination. The patient's primary care provider has been notified of our unsuccessful attempts to make or maintain contact with the patient. The care management team is pleased to engage with this patient at any time in the future should he/she be interested in assistance from the care management team.   Follow Up Plan: A HIPAA compliant phone message was left for the patient providing contact information and requesting a return call.   [name], BSW, [place] [facility]  [place]  High Risk Managed Medicaid Team  [phone]

## 2021-09-22 ENCOUNTER — Ambulatory Visit: Payer: Medicaid Other

## 2021-10-01 ENCOUNTER — Telehealth: Payer: Self-pay

## 2021-10-01 NOTE — Telephone Encounter (Signed)
--  Pt has been dealing with a yeast infection, and it's come back. having vulvar itching but no discharge, onset of itching was Thursday/friday last weektrulicity shot was monday, took diflucan on monday of last week. no fever or other symptoms  10/01/2021 3:16:39 PM See PCP within 24 Hours [place], RN, [facility]  Referrals REFERRED TO PCP OFFICE  Pt scheduled for appt with PCP on 10/02/21 at 1330

## 2021-10-02 ENCOUNTER — Encounter: Payer: Self-pay | Admitting: Internal Medicine

## 2021-10-02 ENCOUNTER — Ambulatory Visit (INDEPENDENT_AMBULATORY_CARE_PROVIDER_SITE_OTHER): Payer: Medicaid Other | Admitting: Internal Medicine

## 2021-10-02 ENCOUNTER — Other Ambulatory Visit (HOSPITAL_COMMUNITY)
Admission: RE | Admit: 2021-10-02 | Discharge: 2021-10-02 | Disposition: A | Discharge status: Home / Self Care | Payer: Medicaid Other | Source: Ambulatory Visit | Attending: Internal Medicine | Admitting: Internal Medicine

## 2021-10-02 VITALS — BP 120/80 | HR 100 | Temp 97.8°F | Wt 246.8 lb

## 2021-10-02 DIAGNOSIS — N898 Other specified noninflammatory disorders of vagina: Secondary | ICD-10-CM | POA: Diagnosis not present

## 2021-10-02 DIAGNOSIS — N949 Unspecified condition associated with female genital organs and menstrual cycle: Secondary | ICD-10-CM | POA: Diagnosis not present

## 2021-10-02 DIAGNOSIS — Z124 Encounter for screening for malignant neoplasm of cervix: Secondary | ICD-10-CM | POA: Diagnosis not present

## 2021-10-02 NOTE — Progress Notes (Signed)
Acute office Visit     CC/Reason for Visit: Vaginal burning and itching  HPI: MARCEDES TECH is a 46 y.o. female who is coming in today for the above mentioned reasons.  She recently called and requested empiric treatment for a vaginal yeast infection, she was called in [place].  Despite this vaginal itching and burning persists.  She comes in today for evaluation.  She has not noticed a discharge, states she is not sexually active.  Past Medical/Surgical History: Past Medical History:  Diagnosis Date   Diabetes mellitus ([place])    Dyslipidemia     Past Surgical History:  Procedure Laterality Date   CESAREAN SECTION     KNEE ARTHROCENTESIS      Social History:  reports that she quit smoking about 3 years ago. Her smoking use included cigarettes. She has never used smokeless tobacco. She reports that she does not currently use alcohol. She reports current drug use. Frequency: 7.00 times per week. Drug: Marijuana.  Allergies: No Known Allergies  Family History:  Family History  Problem Relation Age of Onset   Diabetes Mother    Hypertension Mother    Cancer Maternal Grandmother        throat   Heart disease Maternal Grandmother      Current Outpatient Medications:    atorvastatin (LIPITOR) 80 MG tablet, TAKE ONE TABLET BY MOUTH DAILY, Disp: 90 tablet, Rfl: 1   Blood Glucose Monitoring Suppl (ACCU-CHEK GUIDE) w/Device KIT, 1 Device by Does not apply route daily. E11.9, Disp: 1 kit, Rfl: 0   Blood Glucose Monitoring Suppl (ONETOUCH VERIO) w/Device KIT, 1 Device by Does not apply route as directed., Disp: 1 kit, Rfl: 0   Dulaglutide (TRULICITY) 1.5 EE/1.0OF SOPN, Inject 1.5 mg into the skin once a week., Disp: 6 mL, Rfl: 0   ergocalciferol (VITAMIN D2) 1.25 MG (50000 UT) capsule, Take 1 capsule (50,000 Units total) by mouth once a week., Disp: 12 capsule, Rfl: 0   glucose blood (ACCU-CHEK GUIDE) test strip, Use as instructed to test blood sugar 2 times daily E11.9,  Disp: 100 each, Rfl: 12   ibuprofen (ADVIL) 800 MG tablet, Take 1 tablet (800 mg total) by mouth every 8 (eight) hours as needed., Disp: 90 tablet, Rfl: 2   lisinopril (ZESTRIL) 5 MG tablet, Take 1 tablet (5 mg total) by mouth daily., Disp: 90 tablet, Rfl: 1   meloxicam (MOBIC) 15 MG tablet, Take 1 tablet (15 mg total) by mouth daily., Disp: 30 tablet, Rfl: 3   metFORMIN (GLUCOPHAGE) 1000 MG tablet, Take 1 tablet (1,000 mg total) by mouth at bedtime., Disp: 180 tablet, Rfl: 1  Review of Systems:  Constitutional: Denies fever, chills, diaphoresis, appetite change and fatigue.  HEENT: Denies photophobia, eye pain, redness, hearing loss, ear pain, congestion, sore throat, rhinorrhea, sneezing, mouth sores, trouble swallowing, neck pain, neck stiffness and tinnitus.   Respiratory: Denies SOB, DOE, cough, chest tightness,  and wheezing.   Cardiovascular: Denies chest pain, palpitations and leg swelling.  Gastrointestinal: Denies nausea, vomiting, abdominal pain, diarrhea, constipation, blood in stool and abdominal distention.  Genitourinary: Denies dysuria, urgency, frequency, hematuria, flank pain and difficulty urinating.  Endocrine: Denies: hot or cold intolerance, sweats, changes in hair or nails, polyuria, polydipsia. Musculoskeletal: Denies myalgias, back pain, joint swelling, arthralgias and gait problem.  Skin: Denies pallor, rash and wound.  Neurological: Denies dizziness, seizures, syncope, weakness, light-headedness, numbness and headaches.  Hematological: Denies adenopathy. Easy bruising, personal or family bleeding history  Psychiatric/Behavioral: Denies suicidal ideation, mood changes, confusion, nervousness, sleep disturbance and agitation    Physical Exam: Vitals:   10/02/21 1340  BP: 120/80  Pulse: 100  Temp: 97.8 F (36.6 C)  TempSrc: Oral  SpO2: 99%  Weight: 246 lb 12.8 oz (111.9 kg)    Body mass index is 35.41 kg/m.   Constitutional: NAD, calm, comfortable Eyes:  PERRL, lids and conjunctivae normal, wears corrective lenses ENMT: Mucous membranes are moist.  Psychiatric: Normal judgment and insight. Alert and oriented x 3. Normal mood.    Impression and Plan:  Screening for cervical cancer - Plan: PAP [[place]]  Vaginal itching - Plan: Cervicovaginal ancillary only  Vaginal burning  -Speculum exam, wet prep and Pap performed today. -No vaginal discharge is evident, vaginal canal and introitus are not inflamed and erythematous as is typical with yeast. -Await results of wet prep, if negative and symptoms persist consider GYN referral.   Time spent:26 minutes reviewing chart, interviewing and examining patient and formulating plan of care.    Lelon Frohlich, MD [facility]

## 2021-10-03 LAB — CERVICOVAGINAL ANCILLARY ONLY
Bacterial Vaginitis (gardnerella): NEGATIVE
Candida Glabrata: NEGATIVE
Candida Vaginitis: NEGATIVE
Chlamydia: NEGATIVE
Comment: NEGATIVE
Comment: NEGATIVE
Comment: NEGATIVE
Comment: NEGATIVE
Comment: NEGATIVE
Comment: NORMAL
Neisseria Gonorrhea: NEGATIVE
Trichomonas: NEGATIVE

## 2021-10-05 ENCOUNTER — Other Ambulatory Visit: Payer: Self-pay | Admitting: Internal Medicine

## 2021-10-05 DIAGNOSIS — E1169 Type 2 diabetes mellitus with other specified complication: Secondary | ICD-10-CM

## 2021-10-06 ENCOUNTER — Other Ambulatory Visit: Payer: Self-pay | Admitting: Pharmacist

## 2021-10-06 LAB — CYTOLOGY - PAP
Adequacy: ABSENT
Comment: NEGATIVE
Diagnosis: NEGATIVE
High risk HPV: NEGATIVE

## 2021-10-06 NOTE — Chronic Care Management (AMB) (Signed)
Chief Complaint  Patient presents with   High Risk Managed Medicaid    Angelica Berg is a 46 y.o. year old female who presented for a telephone visit.   They were referred to the pharmacist by their High Risk Managed Medicaid Care Team  for assistance in managing diabetes.   Patient is participating in a Managed Medicaid Plan:  Yes  Subjective:  Care Team: Primary Care Provider: [name], [name], MD ; Next Scheduled Visit: 11/25/21  Medication Access/Adherence  [facility]  [id number] [place] [place]Phone: [phone]: [phone] [id number]- [place] [address] Gary[address]Phone: [phone]: [phone] HT[id number]-Lady [place] NW[place]ervilleFR[place] StreetSFidelityNAlaska[address]Phone: [phone]: [phone] [facility] [address]Phone: [phone]: [phone]  Patient reports affordability concerns with their medications: No  Patient reports access/transportation concerns to their pharmacy: No  Patient reports adherence concerns with their medications:  No     Diabetes:  Current medications: Trulicity 1.5 mg weekly, metformin 1000 mg daily  Current glucose readings: not checking, but bringing her meter to work and plans on checking today  Reports continued concerns with yeast infection symptoms. Saw PCP last week. Pending results, referring to GYN.   Hypertension:  Current medications: lisinopril 5 mg daily  Hyperlipidemia/ASCVD Risk Reduction  Current lipid lowering medications: atorvastatin 80 mg daily  Antiplatelet regimen: none  Health Maintenance  Health  Maintenance Due  Topic Date Due   COVID-19 Vaccine (1) Never done   FOOT EXAM  Never done   HIV Screening  Never done   Hepatitis C Screening  Never done   PAP SMEAR-Modifier  08/10/2021     Objective: Lab Results  Component Value Date   HGBA1C 9.5 (A) 08/25/2021    Lab Results  Component Value Date   CREATININE 0.60 08/21/2020   BUN 7 08/21/2020   NA 140 08/21/2020   K 4.3 08/21/2020   CL 105 08/21/2020   CO2 28 08/21/2020    Lab Results  Component Value Date   CHOL 113 08/21/2020   HDL 44.70 08/21/2020   LDLCALC 52 08/21/2020   TRIG 80.0 08/21/2020   CHOLHDL 3 08/21/2020    Medications Reviewed Today     Reviewed by HOsker Mason RPH-CPP (Pharmacist) on 10/06/21 at [phone] Med List Status: <None>   Medication Order Taking? Sig Documenting Provider Last Dose Status Informant  atorvastatin (LIPITOR) 80 MG tablet [id number] TAKE ONE TABLET BY MOUTH DAILY HIsaac Bliss ERayford Halsted MD Taking Active   Blood Glucose Monitoring Suppl (ACCU-CHEK GUIDE) w/Device KIT [id number] 1 Device by Does not apply route daily. E11.9 [name], IMelanie Crazier MD Taking Active            Med Note ([name] CToney ReilApr 17, 2023 11:03 AM)    Blood Glucose Monitoring Suppl ([facility] Health LLPVERIO) w/Device KIT [id number] 1 Device by Does not apply route as directed. [name], IMelanie Crazier MD Taking Active   Dulaglutide (TRULICITY) 1.5 MFF/6.3WGSOPN [id number] Inject 1.5 mg into the skin once a week. HIsaac Bliss ERayford Halsted MD  Taking Active   ergocalciferol (VITAMIN D2) 1.25 MG (50000 UT) capsule [id number] Yes Take 1 capsule (50,000 Units total) by mouth once a week. [name], [name], MD Taking Active            Med Note [name], Taetum Flewellen T   Mon Aug 04, 2021 11:03 AM)    glucose blood (ACCU-CHEK GUIDE) test strip [id number]  Use as instructed to test blood sugar 2 times daily E11.9 [name], [name], MD  Active   ibuprofen (ADVIL) 800 MG  tablet [id number]  Take 1 tablet (800 mg total) by mouth every 8 (eight) hours as needed. [name], MD  Active   lisinopril (ZESTRIL) 5 MG tablet [id number] Yes Take 1 tablet (5 mg total) by mouth daily. [name], [name], MD Taking Active   meloxicam [facility]) 15 MG tablet [id number] Yes Take 1 tablet (15 mg total) by mouth daily. [name], MD Taking Active            Med Note [name], Chevelle Coulson T   Mon Aug 04, 2021 11:03 AM)    metFORMIN (GLUCOPHAGE) 1000 MG tablet [id number] Yes Take 1 tablet (1,000 mg total) by mouth at bedtime. [name], [name], MD Taking Active            Med Note [name], Toney Reil Aug 04, 2021 11:03 AM)                Assessment/Plan:   Diabetes: - Currently uncontrolled but anticipated to improve - Reviewed goal A1c, goal fasting, and goal 2 hour post prandial glucose - Discussed glucose in urine could increase risk of genitourinary infections, encouraged to check glucose to determine control. Discussed that GLP1 therapy does not generally increase risk of UTI.  - Encouraged to continue current regimen at this time  Hypertension: - Currently controlled - Recommend to continue current regimen   Hyperlipidemia/ASCVD Risk Reduction: - Currently controlled.  - Reviewed that refill was sent today - Recommend to continue current regimen at this time.   Follow Up Plan: phone call in 4 weeks  Catie TJodi Mourning, PharmD, [facility] [phone]

## 2021-10-06 NOTE — Patient Instructions (Signed)
Alexine,   It was great talking to you today!  Check your blood sugars twice daily:  1) Fasting, first thing in the morning before breakfast and  2) 2 hours after your largest meal.   For a goal A1c of less than 7%, goal fasting readings are less than 130 and goal 2 hour after meal readings are less than 180.   Take care!  Catie Eppie Gibson, PharmD, [facility] [phone]

## 2021-10-16 ENCOUNTER — Other Ambulatory Visit: Payer: Self-pay | Admitting: Internal Medicine

## 2021-10-16 DIAGNOSIS — E1169 Type 2 diabetes mellitus with other specified complication: Secondary | ICD-10-CM

## 2021-11-03 ENCOUNTER — Ambulatory Visit: Payer: Self-pay

## 2021-11-05 ENCOUNTER — Ambulatory Visit: Payer: Self-pay

## 2021-11-05 ENCOUNTER — Telehealth: Payer: Self-pay | Admitting: Pharmacist

## 2021-11-05 NOTE — Telephone Encounter (Signed)
Contacted patient for scheduled visit, patient requested to reschedule. Rescheduled for Monday. She also requests a call back from BSW.   Catie Eppie Gibson, PharmD, [facility] [phone]

## 2021-11-11 ENCOUNTER — Ambulatory Visit: Payer: Self-pay

## 2021-11-12 ENCOUNTER — Other Ambulatory Visit: Payer: Self-pay | Admitting: Pharmacist

## 2021-11-12 DIAGNOSIS — E1169 Type 2 diabetes mellitus with other specified complication: Secondary | ICD-10-CM

## 2021-11-12 NOTE — Patient Instructions (Signed)
Recia,   Keep up the great work! Call your GYN to schedule an appointment regarding your symptoms. Like I said, the Trulicity medication doesn't typically cause any genital/genitourinary side effects, but we could try something different if you really think there could be an association.   If you can, check your blood sugars twice periodically:  1) Fasting, first thing in the morning before breakfast and  2) 2 hours after your largest meal.   For a goal A1c of less than 7%, goal fasting readings are less than 130 and goal 2 hour after meal readings are less than 180.   Bring any readings with you to Dr. Wynetta Emery appointment week after next.   Call me with any questions! I'll ask Alexis to call you when she is back.   Catie Eppie Gibson, PharmD, [facility] [phone]

## 2021-11-12 NOTE — Chronic Care Management (AMB) (Signed)
Chief Complaint  Patient presents with   Diabetes    Angelica Berg is a 46 y.o. year old female who presented for a telephone visit.   They were referred to the pharmacist by their High Risk Managed Medicaid Care Team  for assistance in managing diabetes.   Patient is participating in a Managed Medicaid Plan:  Yes  Subjective:  Care Team: Primary Care Provider: [name], [name], MD ; Next Scheduled Visit: 11/25/21  Medication Access/Adherence  [facility]  [id number] [place] [address] [place]Phone: [phone]: [phone] [id number]- [place] [address] [address]GL[address] GaryNAlaska[address]Phone: [phone]: [phone] HLost Bridge Village098338250-Lady [place] [address]FM[address] Washington[place]ton[address]SAlta VistaNAlaska[address]Phone: [phone]: [phone] [facility] #[place]Phone: [phone]: [phone]  Patient reports affordability concerns with their medications: No  Patient reports access/transportation concerns to their pharmacy: No  Patient reports adherence concerns with their medications:  No    However, does report she is looking into new housing options and is having a difficult time finding something affordable. This is [place]   Diabetes:  Current medications: metformin 14481mg daily, Trulicity 1.5 mg weekly  Current glucose readings: has not been checking lately due to other stressors, has chosen to focus on taking medications  Hypertension:  Current medications: lisinopril 5 mg (also for nephroprotection)   Hyperlipidemia/ASCVD Risk Reduction  Current lipid lowering medications: atorvastatin 80  mg daily   Does note that she continues to have vaginal itching/irritation. Plans to call GYN for appointment. She feels like the itching worsens after taking Trulicity.    Health Maintenance  Health Maintenance Due  Topic Date Due   COVID-19 Vaccine (1) Never done   FOOT EXAM  Never done   HIV Screening  Never done   Hepatitis C Screening  Never done     Objective: Lab Results  Component Value Date   HGBA1C 9.5 (A) 08/25/2021    Lab Results  Component Value Date   CREATININE 0.60 08/21/2020   BUN 7 08/21/2020   NA 140 08/21/2020   K 4.3 08/21/2020   CL 105 08/21/2020   CO2 28 08/21/2020    Lab Results  Component Value Date   CHOL 113 08/21/2020   HDL 44.70 08/21/2020   LDLCALC 52 08/21/2020   TRIG 80.0 08/21/2020   CHOLHDL 3 08/21/2020    Medications Reviewed Today     Reviewed by HOsker Mason RPH-CPP (Pharmacist) on 11/12/21 at 1451  Med List Status: <None>   Medication Order Taking? Sig Documenting Provider Last Dose Status Informant  atorvastatin (LIPITOR) 80 MG tablet [id number] TAKE ONE TABLET BY MOUTH DAILY HIsaac Bliss ERayford Halsted MD Taking Active   Blood Glucose Monitoring Suppl (ACCU-CHEK GUIDE) w/Device KIT [id number] 1 Device by Does not apply route daily. E11.9 [name], IMelanie Crazier MD Taking Active            Med Note ([name] CToney ReilApr 17, 2023 11:03 AM)    Blood Glucose Monitoring Suppl ([facility]) w/Device KIT [id number] 1 Device by Does not apply  route as directed. [name], [name], MD Taking Active   Dulaglutide (TRULICITY) 1.5 BW/6.2MB SOPN [id number] Yes Inject 1.5 mg into the skin once a week. [name], [name], MD Taking Active   ergocalciferol (VITAMIN D2) 1.25 MG (50000 UT) capsule [id number] No Take 1 capsule (50,000 Units total) by mouth once a week.  Patient not taking: Reported on 11/12/2021   [name], [name], MD Not Taking Active            Med Note [name],  [name]   Mon Aug 04, 2021 11:03 AM)    glucose blood (ACCU-CHEK GUIDE) test strip [id number]  Use as instructed to test blood sugar 2 times daily E11.9 [name], [name], MD  Active   ibuprofen (ADVIL) 800 MG tablet [id number] No Take 1 tablet (800 mg total) by mouth every 8 (eight) hours as needed.  Patient not taking: Reported on 11/12/2021   [name], MD Not Taking Active   lisinopril (ZESTRIL) 5 MG tablet [id number] Yes Take 1 tablet (5 mg total) by mouth daily. [name], [name], MD Taking Active   meloxicam [facility]) 15 MG tablet [id number]  Take 1 tablet (15 mg total) by mouth daily. [name], MD  Active            Med Note [name], [name]   Mon Aug 04, 2021 11:03 AM)    metFORMIN (GLUCOPHAGE) 1000 MG tablet [id number] Yes Take 1 tablet (1,000 mg total) by mouth at bedtime. [name], [name], MD Taking Active            Med Note [name], Toney Reil Aug 04, 2021 11:03 AM)              SDOH Interventions    Flowsheet Row Most Recent Value  SDOH Interventions   Housing Interventions Other (Comment)  [refer to SW]       Assessment/Plan:   Diabetes: - Currently uncontrolled - Reviewed goal A1c, goal fasting, and goal 2 hour post prandial glucose - Praised for focus on medication adherence at a time of personal stress - Recommend to continue current regimen. Patient doesn't want to stop Trulicity, even if she feels there might be a temporal relationship with vaginal symptoms. We discussed that this class of medications generally doesn't increase risk of GU side effects, but encouraged to schedule with GYN to discuss. If patient wants to trial stopping Trulicity, could try alternative GLP1   Hypertension: - Currently controlled - Recommend to continue current regimen. Reviewed benefit of ACEi in albuminuria   Hyperlipidemia/ASCVD Risk Reduction: - Currently controlled.  - Recommend to continue current regimen  Follow Up Plan:  phone call in 4 weeks  Catie TJodi Mourning, PharmD, [facility] [phone]

## 2021-11-17 ENCOUNTER — Other Ambulatory Visit: Payer: Self-pay

## 2021-11-17 NOTE — Patient Instructions (Signed)
Visit Information  [name] was given information about Medicaid Managed Care team care coordination services as a part of their [employer] Community Plan Medicaid benefit. Angelica Berg verbally consented to engagement with the Medicaid Managed Care team.   If you are experiencing a medical emergency, please call 911 or report to your local emergency department or urgent care.   If you have a non-emergency medical problem during routine business hours, please contact your provider's office and ask to speak with a nurse.   For questions related to your [employer] Community Plan Medicaid, please call: [phone] or visit the homepage here: [email or web address]  If you would like to schedule transportation through your [employer] Community Plan Medicaid, please call the following number at least 2 days in advance of your appointment: [phone]   Rides for urgent appointments can also be made after hours by calling Member Services.  Call the Behavioral Health Crisis Line at 1-877-334-1141, at any time, 24 hours a day, 7 days a week. If you are in danger or need immediate medical attention call 911.  If you would like help to quit smoking, call 1-800-QUIT-NOW (1-800-784-8669) OR Espaol: 1-855-Djelo-Ya (1-855-335-3569) o para ms informacin haga clic aqu or Text READY to 200-400 to register via text  [name] - following are the goals we discussed in your visit today:   Goals Addressed   None      Social Worker will follow up in 30 days.   Angelica Berg, BSW, MHA [facility]  [place]  High Risk Managed Medicaid Team  [phone]   Following is a copy of your plan of care:  There are no care plans that you recently modified to display for this patient.   

## 2021-11-17 NOTE — Patient Outreach (Signed)
Medicaid Managed Care Social Work Note  11/17/2021 Name:  Angelica Berg MRN:  [id number] DOB:  Apr 22, 1975  Angelica Berg is an 46 y.o. year old female who is a primary patient of [name], [name], MD.  The [facility] Managed Care Coordination team was consulted for assistance with:   housing  [name] was given information about Medicaid Managed Care Coordination team services today. Angelica Berg Patient agreed to services and verbal consent obtained.  Engaged with patient  for by telephone forfollow up visit in response to referral for case management and/or care coordination services.   Assessments/Interventions:  Review of past medical history, allergies, medications, health status, including review of consultants reports, laboratory and other test data, was performed as part of comprehensive evaluation and provision of chronic care management services.  SDOH: (Social Determinant of Health) assessments and interventions performed: BSW completed a telephone outreach with patient. She stated she received a letter from her landlord's attorney stating the sheriff will be out on 11/19/21 and she will have to be out. She has been searching for apartments but everyone has given her the same answers that they do not have anything available. Patient has been approved for a 2 bedroom 2 bath apartment but it may not be available until 12/02/21. BSW provided patient with 2 other apartment complex's to try.   Advanced Directives Status:  Not addressed in this encounter.  Care Plan                 No Known Allergies  Medications Reviewed Today     Reviewed by Osker Mason, RPH-CPP (Pharmacist) on 11/12/21 at 1451  Med List Status: <None>   Medication Order Taking? Sig Documenting Provider Last Dose Status Informant  atorvastatin (LIPITOR) 80 MG tablet [id number] Yes TAKE ONE TABLET BY MOUTH DAILY [name], [name], MD Taking Active   Blood Glucose Monitoring Suppl  (ACCU-CHEK GUIDE) w/Device KIT [id number] Yes 1 Device by Does not apply route daily. E11.9 [name], [name], MD Taking Active            Med Note [name], Toney Reil Aug 04, 2021 11:03 AM)    Blood Glucose Monitoring Suppl [facility] VERIO) w/Device KIT [id number] Yes 1 Device by Does not apply route as directed. [name], [name], MD Taking Active   Dulaglutide (TRULICITY) 1.5 YF/7.4BS SOPN [id number] Yes Inject 1.5 mg into the skin once a week. [name], [name], MD Taking Active   ergocalciferol (VITAMIN D2) 1.25 MG (50000 UT) capsule [id number] No Take 1 capsule (50,000 Units total) by mouth once a week.  Patient not taking: Reported on 11/12/2021   [name], [name], MD Not Taking Active            Med Note [name], [name]   Mon Aug 04, 2021 11:03 AM)    glucose blood (ACCU-CHEK GUIDE) test strip [id number]  Use as instructed to test blood sugar 2 times daily E11.9 [name], [name], MD  Active   ibuprofen (ADVIL) 800 MG tablet [id number] No Take 1 tablet (800 mg total) by mouth every 8 (eight) hours as needed.  Patient not taking: Reported on 11/12/2021   [name], MD Not Taking Active   lisinopril (ZESTRIL) 5 MG tablet [id number] Yes Take 1 tablet (5 mg total) by mouth daily. [name], [name], MD Taking Active   meloxicam [facility]) 15 MG tablet [id number]  Take 1 tablet (15 mg total) by mouth  daily. [name], MD  Active            Med Note [name], [name]   Mon Aug 04, 2021 11:03 AM)    metFORMIN (GLUCOPHAGE) 1000 MG tablet [id number] Yes Take 1 tablet (1,000 mg total) by mouth at bedtime. [name], [name], MD Taking Active            Med Note [name], Toney Reil Aug 04, 2021 11:03 AM)              Patient Active Problem List   Diagnosis Date Noted   Pain in left knee 05/23/2019   Morbid obesity ([place]) 08/30/2018   Vitamin D deficiency 08/30/2018   Hyperlipidemia associated with type 2  diabetes mellitus ([place]) 08/30/2018   Transaminitis 08/30/2018    Conditions to be addressed/monitored per PCP order:   housing   There are no care plans that you recently modified to display for this patient.   Follow up:  Patient agrees to Care Plan and Follow-up.  Plan: The Managed Medicaid care management team will reach out to the patient again over the next 30 days.  Date/time of next scheduled Social Work care management/care coordination outreach:  12/18/21  [name], Arita Miss, [place] Medicaid Team  [phone]

## 2021-11-25 ENCOUNTER — Other Ambulatory Visit: Payer: Self-pay | Admitting: Internal Medicine

## 2021-11-25 ENCOUNTER — Encounter: Payer: Self-pay | Admitting: Internal Medicine

## 2021-11-25 ENCOUNTER — Other Ambulatory Visit: Payer: Self-pay | Admitting: *Deleted

## 2021-11-25 ENCOUNTER — Ambulatory Visit (INDEPENDENT_AMBULATORY_CARE_PROVIDER_SITE_OTHER): Payer: Medicaid Other | Admitting: Internal Medicine

## 2021-11-25 VITALS — BP 140/90 | HR 82 | Temp 98.3°F | Ht 69.0 in | Wt 242.1 lb

## 2021-11-25 DIAGNOSIS — E559 Vitamin D deficiency, unspecified: Secondary | ICD-10-CM | POA: Diagnosis not present

## 2021-11-25 DIAGNOSIS — E1169 Type 2 diabetes mellitus with other specified complication: Secondary | ICD-10-CM | POA: Diagnosis not present

## 2021-11-25 DIAGNOSIS — E1121 Type 2 diabetes mellitus with diabetic nephropathy: Secondary | ICD-10-CM

## 2021-11-25 DIAGNOSIS — Z1231 Encounter for screening mammogram for malignant neoplasm of breast: Secondary | ICD-10-CM | POA: Diagnosis not present

## 2021-11-25 DIAGNOSIS — Z0000 Encounter for general adult medical examination without abnormal findings: Secondary | ICD-10-CM | POA: Diagnosis not present

## 2021-11-25 DIAGNOSIS — Z23 Encounter for immunization: Secondary | ICD-10-CM

## 2021-11-25 DIAGNOSIS — E785 Hyperlipidemia, unspecified: Secondary | ICD-10-CM

## 2021-11-25 LAB — COMPREHENSIVE METABOLIC PANEL
ALT: 31 U/L (ref 0–35)
AST: 21 U/L (ref 0–37)
Albumin: 4.2 g/dL (ref 3.5–5.2)
Alkaline Phosphatase: 68 U/L (ref 39–117)
BUN: 7 mg/dL (ref 6–23)
CO2: 31 mEq/L (ref 19–32)
Calcium: 9.4 mg/dL (ref 8.4–10.5)
Chloride: 98 mEq/L (ref 96–112)
Creatinine, Ser: 0.71 mg/dL (ref 0.40–1.20)
GFR: 103.44 mL/min (ref >60.00)
Glucose, Bld: 327 mg/dL — ABNORMAL HIGH (ref 70–99)
Potassium: 3.9 mEq/L (ref 3.5–5.1)
Sodium: 137 mEq/L (ref 135–145)
Total Bilirubin: 1.1 mg/dL (ref 0.2–1.2)
Total Protein: 7.7 g/dL (ref 6.0–8.3)

## 2021-11-25 LAB — CBC WITH DIFFERENTIAL/PLATELET
Basophils Absolute: 0 10*3/uL (ref 0.0–0.1)
Basophils Relative: 0.6 % (ref 0.0–3.0)
Eosinophils Absolute: 0.1 10*3/uL (ref 0.0–0.7)
Eosinophils Relative: 2.5 % (ref 0.0–5.0)
HCT: 40.1 % (ref 36.0–46.0)
Hemoglobin: 13.7 g/dL (ref 12.0–15.0)
Lymphocytes Relative: 46.5 % — ABNORMAL HIGH (ref 12.0–46.0)
Lymphs Abs: 2.3 10*3/uL (ref 0.7–4.0)
MCHC: 34.1 g/dL (ref 30.0–36.0)
MCV: 90.1 fl (ref 78.0–100.0)
Monocytes Absolute: 0.4 10*3/uL (ref 0.1–1.0)
Monocytes Relative: 8.1 % (ref 3.0–12.0)
Neutro Abs: 2.1 10*3/uL (ref 1.4–7.7)
Neutrophils Relative %: 42.3 % — ABNORMAL LOW (ref 43.0–77.0)
Platelets: 263 10*3/uL (ref 150.0–400.0)
RBC: 4.45 Mil/uL (ref 3.87–5.11)
RDW: 12.9 % (ref 11.5–15.5)
WBC: 5 10*3/uL (ref 4.0–10.5)

## 2021-11-25 LAB — LIPID PANEL
Cholesterol: 141 mg/dL (ref 0–200)
HDL: 46.3 mg/dL (ref >39.00)
LDL Cholesterol: 72 mg/dL (ref 0–99)
NonHDL: 94.33
Total CHOL/HDL Ratio: 3
Triglycerides: 111 mg/dL (ref 0.0–149.0)
VLDL: 22.2 mg/dL (ref 0.0–40.0)

## 2021-11-25 LAB — MICROALBUMIN / CREATININE URINE RATIO
Creatinine,U: 163.2 mg/dL
Microalb Creat Ratio: 8.8 mg/g (ref 0.0–30.0)
Microalb, Ur: 14.3 mg/dL — ABNORMAL HIGH (ref 0.0–1.9)

## 2021-11-25 LAB — VITAMIN D 25 HYDROXY (VIT D DEFICIENCY, FRACTURES): VITD: 8.69 ng/mL — ABNORMAL LOW (ref 30.00–100.00)

## 2021-11-25 LAB — HEMOGLOBIN A1C: Hgb A1c MFr Bld: 11 % — ABNORMAL HIGH (ref 4.6–6.5)

## 2021-11-25 MED ORDER — VITAMIN D (ERGOCALCIFEROL) 1.25 MG (50000 UNIT) PO CAPS: 50000.0000 [IU] | ORAL_CAPSULE | ORAL | 0 refills | Status: AC

## 2021-11-25 NOTE — Progress Notes (Signed)
Established Patient Office Visit     CC/Reason for Visit: Annual preventive exam and follow-up chronic medical conditions  HPI: Angelica Berg is a 46 y.o. female who is coming in today for the above mentioned reasons. Past Medical History is significant for: Hypertension, hyperlipidemia, morbid obesity, type 2 diabetes.  She is overdue for eye and dental care.  She is now due for mammogram, diabetic eye exam.  We did a Pap smear in the office in June, however she continues to complain of vulvar itching.  Wet prep from that visit was negative for yeast or any other abnormalities.  She has been treated with fluconazole twice.  She is due for COVID, flu, Tdap vaccines.  She has not been taking her lisinopril.  She has missed a few doses of her Trulicity but has overall been adherent to it.  She has been evicted from her apartment and will be homeless in the next day or 2.   Past Medical/Surgical History: Past Medical History:  Diagnosis Date   Diabetes mellitus ([place])    Dyslipidemia     Past Surgical History:  Procedure Laterality Date   CESAREAN SECTION     KNEE ARTHROCENTESIS      Social History:  reports that she quit smoking about 3 years ago. Her smoking use included cigarettes. She has never used smokeless tobacco. She reports that she does not currently use alcohol. She reports current drug use. Frequency: 7.00 times per week. Drug: Marijuana.  Allergies: No Known Allergies  Family History:  Family History  Problem Relation Age of Onset   Diabetes Mother    Hypertension Mother    Cancer Maternal Grandmother        throat   Heart disease Maternal Grandmother      Current Outpatient Medications:    atorvastatin (LIPITOR) 80 MG tablet, TAKE ONE TABLET BY MOUTH DAILY, Disp: 90 tablet, Rfl: 0   Blood Glucose Monitoring Suppl (ACCU-CHEK GUIDE) w/Device KIT, 1 Device by Does not apply route daily. E11.9, Disp: 1 kit, Rfl: 0   Blood Glucose Monitoring Suppl (ONETOUCH  VERIO) w/Device KIT, 1 Device by Does not apply route as directed., Disp: 1 kit, Rfl: 0   ergocalciferol (VITAMIN D2) 1.25 MG (50000 UT) capsule, Take 1 capsule (50,000 Units total) by mouth once a week., Disp: 12 capsule, Rfl: 0   glucose blood (ACCU-CHEK GUIDE) test strip, Use as instructed to test blood sugar 2 times daily E11.9, Disp: 100 each, Rfl: 12   ibuprofen (ADVIL) 800 MG tablet, Take 1 tablet (800 mg total) by mouth every 8 (eight) hours as needed., Disp: 90 tablet, Rfl: 2   lisinopril (ZESTRIL) 5 MG tablet, Take 1 tablet (5 mg total) by mouth daily., Disp: 90 tablet, Rfl: 1   metFORMIN (GLUCOPHAGE) 1000 MG tablet, Take 1 tablet (1,000 mg total) by mouth at bedtime., Disp: 180 tablet, Rfl: 1  Review of Systems:  Constitutional: Denies fever, chills, diaphoresis, appetite change and fatigue.  HEENT: Denies photophobia, eye pain, redness, hearing loss, ear pain, congestion, sore throat, rhinorrhea, sneezing, mouth sores, trouble swallowing, neck pain, neck stiffness and tinnitus.   Respiratory: Denies SOB, DOE, cough, chest tightness,  and wheezing.   Cardiovascular: Denies chest pain, palpitations and leg swelling.  Gastrointestinal: Denies nausea, vomiting, abdominal pain, diarrhea, constipation, blood in stool and abdominal distention.  Genitourinary: Denies dysuria, urgency, frequency, hematuria, flank pain and difficulty urinating.  Endocrine: Denies: hot or cold intolerance, sweats, changes in hair or nails,  polyuria, polydipsia. Musculoskeletal: Denies myalgias, back pain, joint swelling, arthralgias and gait problem.  Skin: Denies pallor, rash and wound.  Neurological: Denies dizziness, seizures, syncope, weakness, light-headedness, numbness and headaches.  Hematological: Denies adenopathy. Easy bruising, personal or family bleeding history  Psychiatric/Behavioral: Denies suicidal ideation, mood changes, confusion, nervousness, sleep disturbance and agitation    Physical  Exam: Vitals:   11/25/21 0857  BP: (!) 140/90  Pulse: 82  Temp: 98.3 F (36.8 C)  TempSrc: Oral  SpO2: 100%  Weight: 242 lb 1.6 oz (109.8 kg)  Height: _0  (1.753 m)    Body mass index is 35.75 kg/m.   Constitutional: NAD, calm, comfortable Eyes: PERRL, lids and conjunctivae normal, wears corrective lenses ENMT: Mucous membranes are moist. Posterior pharynx clear of any exudate or lesions. Normal dentition. Tympanic membrane is pearly white, no erythema or bulging. Neck: normal, supple, no masses, no thyromegaly Respiratory: clear to auscultation bilaterally, no wheezing, no crackles. Normal respiratory effort. No accessory muscle use.  Cardiovascular: Regular rate and rhythm, no murmurs / rubs / gallops. No extremity edema. 2+ pedal pulses. No carotid bruits.  Abdomen: no tenderness, no masses palpated. No hepatosplenomegaly. Bowel sounds positive.  Musculoskeletal: no clubbing / cyanosis. No joint deformity upper and lower extremities. Good ROM, no contractures. Normal muscle tone.  Skin: no rashes, lesions, ulcers. No induration Neurologic: CN 2-12 grossly intact. Sensation intact, DTR normal. Strength 5/5 in all 4.  Psychiatric: Normal judgment and insight. Alert and oriented x 3. Normal mood.    Impression and Plan:  Encounter for preventive health examination -Recommend routine eye and dental care. -Immunizations: Tdap today, advised to get COVID at pharmacy -Healthy lifestyle discussed in detail. -Labs to be updated today. -Colon cancer screening: Commence age 2 -Breast cancer screening: 10/2020, referral placed -Cervical cancer screening: 09/2021 -Lung cancer screening: Not applicable -Prostate cancer screening: Not applicable -DEXA: Not applicable  Encounter for screening mammogram for malignant neoplasm of breast  - Plan: MM Digital Screening  Type 2 diabetes mellitus with diabetic nephropathy, without long-term current use of insulin ([place])  - Plan:  Ambulatory referral to Ophthalmology, CBC with Differential/Platelet, Comprehensive metabolic panel, Hemoglobin A1c, Microalbumin / creatinine urine ratio, AMB Referral to [facility] -Check A1c, hopefully improved with increase Trulicity dose. -She is now due for eye exam. -Check microalbumin. -Referral to chronic care management for homelessness and assistance with chronic conditions.  Need for Tdap vaccination -Tdap administered today.  Morbid obesity ([place]) -Discussed healthy lifestyle, including increased physical activity and better food choices to promote weight loss.  Vitamin D deficiency -Check levels today.  Hyperlipidemia associated with type 2 diabetes mellitus ([place])  - Plan: Lipid panel -Goal LDL less than 70, currently on atorvastatin 80 mg.    Patient Instructions  -Nice seeing you today!!  -Lab work today; will notify you once results are available.  -Tdap today.  -Remember COVID vaccine at pharmacy.  -Eye exam and mammogram requested today.  -Schedule follow up with GYN.  -Make sure you are taking all medication as prescribed.  -Schedule follow up in 3 months.      Lelon Frohlich, MD [facility]

## 2021-11-25 NOTE — Addendum Note (Signed)
Addended by: Kern Reap B on: 11/25/2021 09:40 AM   Modules accepted: Orders

## 2021-11-25 NOTE — Patient Instructions (Signed)
-  Nice seeing you today!!  -Lab work today; will notify you once results are available.  -Tdap today.  -Remember COVID vaccine at pharmacy.  -Eye exam and mammogram requested today.  -Schedule follow up with GYN.  -Make sure you are taking all medication as prescribed.  -Schedule follow up in 3 months.

## 2021-11-26 ENCOUNTER — Telehealth: Payer: Self-pay | Admitting: Internal Medicine

## 2021-11-26 DIAGNOSIS — E1121 Type 2 diabetes mellitus with diabetic nephropathy: Secondary | ICD-10-CM

## 2021-11-26 NOTE — Telephone Encounter (Signed)
Referral [id number], provider does not take the patient's insurance coverage. Pls reissue to another provider

## 2021-11-26 NOTE — Addendum Note (Signed)
Addended by: Kern Reap B on: 11/26/2021 10:58 AM   Modules accepted: Orders

## 2021-11-26 NOTE — Telephone Encounter (Signed)
Referral placed.

## 2021-11-27 ENCOUNTER — Other Ambulatory Visit: Payer: Self-pay | Admitting: Internal Medicine

## 2021-11-27 DIAGNOSIS — E1121 Type 2 diabetes mellitus with diabetic nephropathy: Secondary | ICD-10-CM

## 2021-11-27 DIAGNOSIS — E119 Type 2 diabetes mellitus without complications: Secondary | ICD-10-CM

## 2021-12-08 ENCOUNTER — Encounter: Payer: Self-pay | Admitting: Obstetrics & Gynecology

## 2021-12-08 ENCOUNTER — Ambulatory Visit (INDEPENDENT_AMBULATORY_CARE_PROVIDER_SITE_OTHER): Payer: Medicaid Other | Admitting: Obstetrics & Gynecology

## 2021-12-08 VITALS — BP 134/80

## 2021-12-08 DIAGNOSIS — N898 Other specified noninflammatory disorders of vagina: Secondary | ICD-10-CM | POA: Diagnosis not present

## 2021-12-08 DIAGNOSIS — R350 Frequency of micturition: Secondary | ICD-10-CM | POA: Diagnosis not present

## 2021-12-08 LAB — WET PREP FOR TRICH, YEAST, CLUE: RESULT: 8

## 2021-12-08 MED ORDER — FLUCONAZOLE 150 MG PO TABS: 150.0000 mg | ORAL_TABLET | ORAL | 2 refills | Status: AC

## 2021-12-08 MED ORDER — TERCONAZOLE 0.8 % VA CREA: 1.0000 | TOPICAL_CREAM | Freq: Every day | VAGINAL | 0 refills | Status: AC

## 2021-12-08 NOTE — Progress Notes (Signed)
    SADI ARAVE [birth year] [id number]        46 y.o.  G2P0011 Married  RP: Vaginal itching and urinary frequency x 3 weeks  HPI:  Vaginal itching and urinary frequency x 3 weeks.  Improved with Fluconazole 2 weeks ago, but itching at the vulva came back after 3 days.  No dysuria.  No blood in urine.  No fever.  Abstinent.    OB History  Gravida Para Term Preterm AB Living  2 1     1 1   SAB IAB Ectopic Multiple Live Births  1            # Outcome Date GA Lbr Len/2nd Weight Sex Delivery Anes PTL Lv  2 SAB           1 Para             Past medical history,surgical history, problem list, medications, allergies, family history and social history were all reviewed and documented in the EPIC chart.   Directed ROS with pertinent positives and negatives documented in the history of present illness/assessment and plan.  Exam:  Vitals:   12/08/21 1201  BP: 134/80   General appearance:  Normal  CVAT Neg bilaterally  Gynecologic exam: Vulva Erythema.  Speculum:  Mild increase in discharge.  Wet prep done.  U/A:  Yellow, slightly cloudy, Nit Neg, WBC 0-5, RBC 0-2, Bacteria few.  Yeasts with budding.   Wet prep: Yeasts present with budding.   Assessment/Plan:  46 y.o. G2P0011   1. Vaginal itching Vaginal itching and urinary frequency x 3 weeks.  Improved with Fluconazole 2 weeks ago, but itching at the vulva came back after 3 days.  No dysuria.  No blood in urine.  No fever.  Abstinent. Yeast vaginitis confirmed by Wet prep.  Given that Yeasts were also present in the urine, will treat with Diflucan (responds better to the brand) 150 mg every other day x 3.  Terconazole cream as well x 3 days. Erythema at the vulva, Hydrocortisone 1% cream as needed. Boric Acid weekly, yogurt, Probiotic for prevention.   - WET PREP FOR TRICH, YEAST, CLUE  2. Urinary frequency Yeasts with budding in urine.  Diflucan prescribe.  U. Culture pending, will treat per results. - Urinalysis,Complete  w/RFL Culture  Other orders - fluconazole (DIFLUCAN) 150 MG tablet; Take 1 tablet (150 mg total) by mouth every other day for 3 days. Better response with brand.  Please supply Diflucan. - terconazole (TERAZOL 3) 0.8 % vaginal cream; Place 1 applicator vaginally at bedtime for 3 days.   59 MD, 12:16 PM 12/08/2021

## 2021-12-10 ENCOUNTER — Other Ambulatory Visit: Payer: Self-pay

## 2021-12-10 LAB — CULTURE INDICATED

## 2021-12-10 LAB — URINALYSIS, COMPLETE W/RFL CULTURE
Bilirubin Urine: NEGATIVE
Casts: NONE SEEN /LPF
Crystals: NONE SEEN /HPF
Hgb urine dipstick: NEGATIVE
Hyaline Cast: NONE SEEN /LPF
Ketones, ur: NEGATIVE
Leukocyte Esterase: NEGATIVE
Nitrites, Initial: NEGATIVE
Protein, ur: NEGATIVE
Specific Gravity, Urine: 1.01 (ref 1.001–1.035)
pH: 5 (ref 5.0–8.0)

## 2021-12-10 LAB — URINE CULTURE
MICRO NUMBER:: 13807254
SPECIMEN QUALITY:: ADEQUATE

## 2021-12-10 MED ORDER — AMOXICILLIN 500 MG PO CAPS: 500.0000 mg | ORAL_CAPSULE | Freq: Three times a day (TID) | ORAL | 0 refills | Status: DC

## 2021-12-16 ENCOUNTER — Other Ambulatory Visit: Payer: Self-pay | Admitting: Internal Medicine

## 2021-12-16 DIAGNOSIS — E1129 Type 2 diabetes mellitus with other diabetic kidney complication: Secondary | ICD-10-CM

## 2021-12-16 DIAGNOSIS — E119 Type 2 diabetes mellitus without complications: Secondary | ICD-10-CM | POA: Diagnosis not present

## 2021-12-16 LAB — HM DIABETES EYE EXAM

## 2021-12-18 ENCOUNTER — Telehealth: Payer: Self-pay | Admitting: Internal Medicine

## 2021-12-18 ENCOUNTER — Other Ambulatory Visit: Payer: Self-pay

## 2021-12-18 DIAGNOSIS — M79676 Pain in unspecified toe(s): Secondary | ICD-10-CM

## 2021-12-18 NOTE — Patient Outreach (Signed)
Medicaid Managed Care Social Work Note  12/18/2021 Name:  LYRICA MCCLARTY MRN:  [id number] DOB:  Apr 30, 1975  [name] is an 46 y.o. year old female who is a primary patient of [name], [name], MD.  The Medicaid Managed Care Coordination team was consulted for assistance with:  Community Resources   Ms. Bradburn was given information about Medicaid Managed Care Coordination team services today. [name] agreed to services and verbal consent obtained.  Engaged with patient  for by telephone forfollow up visit in response to referral for case management and/or care coordination services.   Assessments/Interventions:  Review of past medical history, allergies, medications, health status, including review of consultants reports, laboratory and other test data, was performed as part of comprehensive evaluation and provision of chronic care management services.  SDOH: (Social Determinant of Health) assessments and interventions performed: BSW completed a telephone outreach with patient. She was able to find an apartment and has moved in. She stated her first month of rent is free but she may need assistance with next months rent. Patient states she thinks she broke her toe. BSW suggested patient go to see a podiatrist. BSW will email a list of podiatrist and rent resources.  Advanced Directives Status:  Not addressed in this encounter.  Care Plan                 No Known Allergies  Medications Reviewed Today     Reviewed by Princess Bruins, MD (Physician) on 12/08/21 at 1243  Med List Status: <None>   Medication Order Taking? Sig Documenting Provider Last Dose Status Informant  atorvastatin (LIPITOR) 80 MG tablet [id number] Yes TAKE ONE TABLET BY MOUTH DAILY [name], [name], MD Taking Active   Blood Glucose Monitoring Suppl (ACCU-CHEK GUIDE) w/Device KIT [id number] Yes 1 Device by Does not apply route daily. E11.9 [name], [name], MD  Taking Active            Med Note [name], Toney Reil Aug 04, 2021 11:03 AM)    Blood Glucose Monitoring Suppl [facility] VERIO) w/Device KIT [id number] Yes 1 Device by Does not apply route as directed. [name], [name], MD Taking Active   fluconazole (DIFLUCAN) 150 MG tablet [id number] Yes Take 1 tablet (150 mg total) by mouth every other day for 3 days. Better response with brand.  Please supply Diflucan. Princess Bruins, MD  Active   glucose blood (ACCU-CHEK GUIDE) test strip [id number] Yes Use as instructed to test blood sugar 2 times daily E11.9 [name], [name], MD Taking Active   ibuprofen (ADVIL) 800 MG tablet [id number] Yes Take 1 tablet (800 mg total) by mouth every 8 (eight) hours as needed. [name], MD Taking Active   lisinopril (ZESTRIL) 5 MG tablet [id number] Yes TAKE ONE TABLET BY MOUTH DAILY [name], [name], MD Taking Active   metFORMIN (GLUCOPHAGE) 1000 MG tablet [id number] Yes Take 1 tablet (1,000 mg total) by mouth at bedtime. [name], [name], MD Taking Active            Med Note [name], [name]   Mon Aug 04, 2021 11:03 AM)    terconazole (TERAZOL 3) 0.8 % vaginal cream [id number] Yes Place 1 applicator vaginally at bedtime for 3 days. Princess Bruins, MD  Active   TRULICITY 1.5 NT/7.0YF Bonney Aid [id number] Yes INJECT 1.5 MG UNDER THE SKIN ONCE WEEKLY [name], [name], MD Taking Active   Vitamin D, Ergocalciferol, (DRISDOL)  1.25 MG (50000 UNIT) CAPS capsule [id number] Yes Take 1 capsule (50,000 Units total) by mouth every 7 (seven) days for 12 doses. [name], [name], MD Taking Active             Patient Active Problem List   Diagnosis Date Noted   Pain in left knee 05/23/2019   Morbid obesity ([place]) 08/30/2018   Vitamin D deficiency 08/30/2018   Hyperlipidemia associated with type 2 diabetes mellitus ([place]) 08/30/2018   Transaminitis 08/30/2018    Conditions to be addressed/monitored per PCP  order:   community resources  There are no care plans that you recently modified to display for this patient.   Follow up:  Patient agrees to Care Plan and Follow-up.  Plan: The Managed Medicaid care management team will reach out to the patient again over the next 30 days.  Date/time of next scheduled Social Work care management/care coordination outreach:  01/16/22  [name], Arita Miss, [place] Medicaid Team  [phone]

## 2021-12-18 NOTE — Telephone Encounter (Signed)
Okay to refer? 

## 2021-12-18 NOTE — Patient Instructions (Signed)
Visit Information  Ms. Stief was given information about Medicaid Managed Care team care coordination services as a part of their [employer] Community Plan Medicaid benefit. Khalaya D Paden verbally consented to engagement with the Medicaid Managed Care team.   If you are experiencing a medical emergency, please call 911 or report to your local emergency department or urgent care.   If you have a non-emergency medical problem during routine business hours, please contact your provider's office and ask to speak with a nurse.   For questions related to your [employer] Community Plan Medicaid, please call: [phone] or visit the homepage here: [email or web address]  If you would like to schedule transportation through your [employer] Community Plan Medicaid, please call the following number at least 2 days in advance of your appointment: [phone]   Rides for urgent appointments can also be made after hours by calling Member Services.  Call the Behavioral Health Crisis Line at 1-877-334-1141, at any time, 24 hours a day, 7 days a week. If you are in danger or need immediate medical attention call 911.  If you would like help to quit smoking, call 1-800-QUIT-NOW (1-800-784-8669) OR Espaol: 1-855-Djelo-Ya (1-855-335-3569) o para ms informacin haga clic aqu or Text READY to 200-400 to register via text  Ms. Reichl - following are the goals we discussed in your visit today:   Goals Addressed   None      Social Worker will follow up in 30 days.   Alek Poncedeleon, BSW, MHA [facility]  [place]  High Risk Managed Medicaid Team  [phone]   Following is a copy of your plan of care:  There are no care plans that you recently modified to display for this patient.   

## 2021-12-18 NOTE — Telephone Encounter (Signed)
Pt states she broke her pinky toe last night. Pt would like a referral to:  [facility] [address] [place], [place] [address] [phone]   Please advise.

## 2021-12-19 NOTE — Telephone Encounter (Signed)
Referral placed.

## 2021-12-23 DIAGNOSIS — H5213 Myopia, bilateral: Secondary | ICD-10-CM | POA: Diagnosis not present

## 2021-12-24 ENCOUNTER — Telehealth: Payer: Self-pay | Admitting: Internal Medicine

## 2021-12-24 NOTE — Telephone Encounter (Signed)
Pt called to say Ortho never received the referral.  Fax number was incorrect.  Please fax referral to: [phone]

## 2021-12-25 ENCOUNTER — Encounter: Payer: Self-pay | Admitting: Internal Medicine

## 2021-12-25 ENCOUNTER — Telehealth: Payer: Self-pay | Admitting: Internal Medicine

## 2021-12-25 NOTE — Telephone Encounter (Signed)
The pt is calling fax number to [facility] is [phone] [address] [place], [place] [address]

## 2021-12-25 NOTE — Telephone Encounter (Signed)
Checked fax number and it was correct. I resent the referral over to ortho. Check in communication in referral.

## 2021-12-26 NOTE — Telephone Encounter (Signed)
Pt is aware the referral was refax

## 2021-12-29 ENCOUNTER — Other Ambulatory Visit: Payer: Self-pay | Admitting: Obstetrics & Gynecology

## 2021-12-29 ENCOUNTER — Other Ambulatory Visit: Payer: Self-pay | Admitting: Obstetrics and Gynecology

## 2021-12-29 ENCOUNTER — Ambulatory Visit (INDEPENDENT_AMBULATORY_CARE_PROVIDER_SITE_OTHER): Payer: Medicaid Other | Admitting: Obstetrics & Gynecology

## 2021-12-29 ENCOUNTER — Telehealth: Payer: Self-pay

## 2021-12-29 ENCOUNTER — Telehealth: Payer: Self-pay | Admitting: Pharmacist

## 2021-12-29 ENCOUNTER — Encounter: Payer: Self-pay | Admitting: Obstetrics & Gynecology

## 2021-12-29 VITALS — BP 130/90 | HR 66 | Ht 69.0 in | Wt 241.0 lb

## 2021-12-29 DIAGNOSIS — R300 Dysuria: Secondary | ICD-10-CM

## 2021-12-29 DIAGNOSIS — L292 Pruritus vulvae: Secondary | ICD-10-CM

## 2021-12-29 DIAGNOSIS — Z01419 Encounter for gynecological examination (general) (routine) without abnormal findings: Secondary | ICD-10-CM

## 2021-12-29 DIAGNOSIS — N951 Menopausal and female climacteric states: Secondary | ICD-10-CM | POA: Diagnosis not present

## 2021-12-29 DIAGNOSIS — M79675 Pain in left toe(s): Secondary | ICD-10-CM

## 2021-12-29 DIAGNOSIS — N952 Postmenopausal atrophic vaginitis: Secondary | ICD-10-CM

## 2021-12-29 LAB — WET PREP FOR TRICH, YEAST, CLUE

## 2021-12-29 MED ORDER — ESTRADIOL 0.1 MG/GM VA CREA: 1.0000 | TOPICAL_CREAM | VAGINAL | 12 refills | Status: DC

## 2021-12-29 MED ORDER — NYSTATIN-TRIAMCINOLONE 100000-0.1 UNIT/GM-% EX OINT: 1.0000 | TOPICAL_OINTMENT | Freq: Two times a day (BID) | CUTANEOUS | 0 refills | Status: DC

## 2021-12-29 NOTE — Patient Instructions (Signed)
Hi Ms. Olazabal, I am very sorry I missed you today, I hope you are doing okay - as a part of your Medicaid benefit, you are eligible for care management and care coordination services at no cost or copay. I was unable to reach you by phone today but would be happy to help you with your health related needs. Please feel free to call me at [phone].  A member of the Managed Medicaid care management team will reach out to you again over the next 30 business  days.   [name] RN, BSN [place]  [employer], production - Managed Medicaid High Risk [phone].

## 2021-12-29 NOTE — Progress Notes (Signed)
Angelica Berg 1975-06-24 [id number]   History:    46 y.o. G2P1A1L2  Single.  Twin girls 44 yo.   RP:  Established patient presenting for annual gyn exam    HPI: Postmenopause with no menses x > 1 year.  Hot flushes present. No pelvic pain.  Abstinent x >4 years.  Pap Neg in 09/2021.  Still having vaginal and vulvar itching and burning.  Treated with Fluconazole without improvement on 12/08/21, didn't use the Terconazole cream.  Burning after passing urine.  BMs normal.  Breasts normal. Mammo Neg 10/2020.  Will schedule mammo now. BMI 35.59.  Walking regularly, but not since she hurt her left small toe, thinks it's broken.  Will refer to Ortho.  Health labs with Fam MD.  DM type 2.    Past medical history,surgical history, family history and social history were all reviewed and documented in the EPIC chart.  Gynecologic History No LMP recorded. (Menstrual status: Perimenopausal).  Obstetric History OB History  Gravida Para Term Preterm AB Living  2 1     1 1   SAB IAB Ectopic Multiple Live Births  1            # Outcome Date GA Lbr Len/2nd Weight Sex Delivery Anes PTL Lv  2 SAB           1 Para              ROS: A ROS was performed and pertinent positives and negatives are included in the history.  GENERAL: No fevers or chills. HEENT: No change in vision, no earache, sore throat or sinus congestion. NECK: No pain or stiffness. CARDIOVASCULAR: No chest pain or pressure. No palpitations. PULMONARY: No shortness of breath, cough or wheeze. GASTROINTESTINAL: No abdominal pain, nausea, vomiting or diarrhea, melena or bright red blood per rectum. GENITOURINARY: No urinary frequency, urgency, hesitancy or dysuria. MUSCULOSKELETAL: No joint or muscle pain, no back pain, no recent trauma. DERMATOLOGIC: No rash, no itching, no lesions. ENDOCRINE: No polyuria, polydipsia, no heat or cold intolerance. No recent change in weight. HEMATOLOGICAL: No anemia or easy bruising or bleeding. NEUROLOGIC: No  headache, seizures, numbness, tingling or weakness. PSYCHIATRIC: No depression, no loss of interest in normal activity or change in sleep pattern.     Exam:   BP (!) 130/90   There is no height or weight on file to calculate BMI.  General appearance : Well developed well nourished female. No acute distress HEENT: Eyes: no retinal hemorrhage or exudates,  Neck supple, trachea midline, no carotid bruits, no thyroidmegaly Lungs: Clear to auscultation, no rhonchi or wheezes, or rib retractions  Heart: Regular rate and rhythm, no murmurs or gallops Breast:Examined in sitting and supine position were symmetrical in appearance, no palpable masses or tenderness,  no skin retraction, no nipple inversion, no nipple discharge, no skin discoloration, no axillary or supraclavicular lymphadenopathy Abdomen: no palpable masses or tenderness, no rebound or guarding Extremities: no edema or skin discoloration or tenderness  Pelvic: Vulva: Erythema throughout the vulva             Vagina: No gross lesions.  Yeasty discharge present.  Wet prep done.  Cervix: No gross lesions or discharge  Uterus  AV, normal size, shape and consistency, non-tender and mobile  Adnexa  Without masses or tenderness  Anus: Normal  U/A completely negative Wet prep Yeasts present   Assessment/Plan:  46 y.o. female for annual exam   1. Well female exam with routine gynecological  exam Postmenopause with no menses x > 1 year.  Hot flushes present. No pelvic pain.  Abstinent x >4 years.  Pap Neg in 09/2021.  Still having vaginal and vulvar itching and burning.  Treated with Fluconazole without improvement on 12/08/21, didn't use the Terconazole cream.  Burning after passing urine.  BMs normal.  Breasts normal. Mammo Neg 10/2020.  Will schedule mammo now. BMI 35.59.  Walking regularly, but not since she hurt her left small toe, thinks it's broken.  Will refer to Ortho.  Health labs with Fam MD.  DM type 2.   2. Menopausal syndrome  (hot flashes) Postmenopause with no menses x > 1 year.  Hot flushes present. No pelvic pain.  Abstinent x >4 years.  Will confirm menopause with an FSH today. 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  Will treat with Terconazole and Mycolog.  Start Estradiol cream twice a week after treating yeast to improve the quality of the vaginal mucosa and vulvar skin.  4. Vulvar itching Yeast vaginitis with secondary vulvitis.  Terconazole cream, then Mycolog.  Usage reviewed and prescriptions sent to pharmacy.  May use Probiotic and/or Boric Acid for prevention. - WET PREP FOR TRICH, YEAST, CLUE  5. Dysuria U/A completely negative.  Reassured. - Urinalysis,Complete w/RFL Culture  Other orders - nystatin-triamcinolone ointment (MYCOLOG); Apply 1 Application topically 2 (two) times daily. - estradiol (ESTRACE) 0.1 MG/GM vaginal cream; Place 1 Applicatorful vaginally 2 (two) times a week.   [name] MD, 11:59 AM 12/29/2021

## 2021-12-29 NOTE — Progress Notes (Signed)
Attempted to contact patient to schedule follow up for medication management. Left HIPAA compliant message for patient to return my call at their convenience.   Catie Eppie Gibson, PharmD, [facility] [phone]

## 2021-12-29 NOTE — Telephone Encounter (Signed)
Refer to Orthopedic Surgeon Received: Today [name], MD  P [facility] Triage Trauma to Left small toe, possible fracture.

## 2021-12-29 NOTE — Telephone Encounter (Signed)
Amb ref placed in Epic to [facility].

## 2021-12-29 NOTE — Patient Outreach (Signed)
Care Coordination  12/29/2021  Angelica Berg 1975/06/30 [id number]   Medicaid Managed Care   Unsuccessful Outreach Note  12/29/2021 Name: Angelica Berg MRN: [id number] DOB: 09/30/1975  Referred by: Philip Aspen, Limmie Patricia, MD Reason for referral : High Risk Managed Medicaid (Chronic case management follow up)   An unsuccessful telephone outreach was attempted today. The patient was referred to the case management team for assistance with care management and care coordination.   Follow Up Plan: The care management team will reach out to the patient again over the next 30 business  days.   [name] RN, BSN [place]  [employer] - Managed Medicaid High Risk [phone]

## 2021-12-30 LAB — FOLLICLE STIMULATING HORMONE: FSH: 43.7 m[IU]/mL

## 2021-12-30 NOTE — Telephone Encounter (Signed)
Patient is scheduled Weds., 12/31/21 at 9:00am with Orthopedist.

## 2021-12-30 NOTE — Telephone Encounter (Signed)
[facility] with [facility] asking if when they ran the prescription did it give any suggestions of covered vaginal estrogen options. Patient has medicaid.

## 2021-12-31 ENCOUNTER — Ambulatory Visit (INDEPENDENT_AMBULATORY_CARE_PROVIDER_SITE_OTHER): Payer: Medicaid Other

## 2021-12-31 ENCOUNTER — Encounter: Payer: Self-pay | Admitting: Physician Assistant

## 2021-12-31 ENCOUNTER — Ambulatory Visit (INDEPENDENT_AMBULATORY_CARE_PROVIDER_SITE_OTHER): Payer: Medicaid Other | Admitting: Physician Assistant

## 2021-12-31 DIAGNOSIS — M79672 Pain in left foot: Secondary | ICD-10-CM

## 2021-12-31 LAB — CULTURE INDICATED

## 2021-12-31 LAB — URINALYSIS, COMPLETE W/RFL CULTURE
Bacteria, UA: NONE SEEN /HPF
Bilirubin Urine: NEGATIVE
Hgb urine dipstick: NEGATIVE
Hyaline Cast: NONE SEEN /LPF
Ketones, ur: NEGATIVE
Leukocyte Esterase: NEGATIVE
Nitrites, Initial: NEGATIVE
Protein, ur: NEGATIVE
Specific Gravity, Urine: 1.015 (ref 1.001–1.035)
pH: 5 (ref 5.0–8.0)

## 2021-12-31 LAB — URINE CULTURE
MICRO NUMBER:: 13898755
SPECIMEN QUALITY:: ADEQUATE

## 2021-12-31 NOTE — Progress Notes (Signed)
Office Visit Note   Patient: Angelica Berg           Date of Birth: [birth year]           MRN: [id number] Visit Date: 12/31/2021              Requested by: [name], MD [address] [place],  [place] [address] PCP: Philip Aspen, Limmie Patricia, MD  Chief Complaint  Patient presents with   Left Foot - Pain      HPI: Patient is a pleasant 46 year old woman who comes in with a 2-week history of left forefoot pain.  She is 2 weeks status post dropping the end table glass on her left foot.  She states it hurts to walk and apply pressure.  Most of her pain is on the third fourth and fifth toes.  She has been taking Aleve for her pain.  She is a diabetic with an A1c 1 month ago of 11  Assessment & Plan: Visit Diagnoses:  1. Left foot pain     Plan: Her exam is reassuring today her toes are warm and pink with brisk capillary refill she has very little swelling and has a strong dorsalis pedis pulse.  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  I recommend immobilization in a postop shoe for the next couple weeks we will see her back then.  Follow-Up Instructions: No follow-ups on file.   Ortho Exam  Patient is alert, oriented, no adenopathy, well-dressed, normal affect, normal respiratory effort. Examination of her left foot no ecchymosis although she does show me a picture on her phone of significant ecchymosis in her fifth toe a couple weeks ago.  Her foot is warm and pink skin is intact she has no evidence of cellulitis.  Palpable pulses palpable.  No obvious deformity she is able to feel and wiggle her toes though it is painful.  She is tender over the the fifth fourth and third proximal phalanx.  No tenderness over the Lisfranc joint no tenderness with manipulation of the Lisfranc joint  Imaging: No results found. No images are attached to the encounter.  Labs: Lab Results  Component Value Date   HGBA1C 11.0 (H) 11/25/2021   HGBA1C 9.5  (A) 08/25/2021   HGBA1C 8.4 (A) 02/25/2021   ESRSEDRATE 9 08/26/2020   LABURIC 4.7 08/26/2020   LABURIC 5.4 04/07/2008   LABURIC 4.7 04/02/2008   REPTSTATUS 10/13/2011 FINAL 10/12/2011   CULT  10/12/2011    Multiple bacterial morphotypes present, none predominant. Suggest appropriate recollection if clinically indicated.     Lab Results  Component Value Date   ALBUMIN 4.2 11/25/2021   ALBUMIN 4.0 08/21/2020   ALBUMIN 4.3 08/16/2019    No results found for: "MG" Lab Results  Component Value Date   VD25OH 8.69 (L) 11/25/2021   VD25OH 11.36 (L) 08/21/2020   VD25OH 18 (L) 11/15/2019    No results found for: "PREALBUMIN"    Latest Ref Rng & Units 11/25/2021    9:29 AM 08/21/2020    9:45 AM 08/16/2019    9:37 AM  CBC EXTENDED  WBC 4.0 - 10.5 K/uL 5.0  6.7  6.5   RBC 3.87 - 5.11 Mil/uL 4.45  4.12  4.40   Hemoglobin 12.0 - 15.0 g/dL 33.2  95.1  88.4   HCT 36.0 - 46.0 % 40.1  38.3  40.3   Platelets 150.0 - 400.0 K/uL 263.0  267.0  267.0  NEUT# 1.4 - 7.7 K/uL 2.1  2.6  2.4   Lymph# 0.7 - 4.0 K/uL 2.3  3.3  3.6      There is no height or weight on file to calculate BMI.  Orders:  Orders Placed This Encounter  Procedures   XR Foot Complete Left   No orders of the defined types were placed in this encounter.    Procedures: No procedures performed  Clinical Data: No additional findings.  ROS:  All other systems negative, except as noted in the HPI. Review of Systems  Objective: Vital Signs: There were no vitals taken for this visit.  Specialty Comments:  No specialty comments available.  PMFS History: Patient Active Problem List   Diagnosis Date Noted   Pain in left knee 05/23/2019   Morbid obesity (HCC) 08/30/2018   Vitamin D deficiency 08/30/2018   Hyperlipidemia associated with type 2 diabetes mellitus (HCC) 08/30/2018   Transaminitis 08/30/2018   Past Medical History:  Diagnosis Date   Diabetes mellitus (HCC)    Dyslipidemia     Family History   Problem Relation Age of Onset   Diabetes Mother    Hypertension Mother    Cancer Maternal Grandmother        throat   Heart disease Maternal Grandmother     Past Surgical History:  Procedure Laterality Date   CESAREAN SECTION     KNEE ARTHROCENTESIS     Social History   Occupational History   Not on file  Tobacco Use   Smoking status: Former    Types: Cigarettes    Quit date: 06/12/2018    Years since quitting: 3.5   Smokeless tobacco: Never  Vaping Use   Vaping Use: Never used  Substance and Sexual Activity   Alcohol use: Yes   Drug use: Yes    Frequency: 7.0 times per week    Types: Marijuana   Sexual activity: Not Currently

## 2022-01-01 ENCOUNTER — Other Ambulatory Visit: Payer: Self-pay | Admitting: *Deleted

## 2022-01-01 MED ORDER — AMOXICILLIN 500 MG PO TABS: 500.0000 mg | ORAL_TABLET | Freq: Three times a day (TID) | ORAL | 0 refills | Status: DC

## 2022-01-01 MED ORDER — TRIAMCINOLONE ACETONIDE 0.1 % EX OINT: 1.0000 | TOPICAL_OINTMENT | Freq: Two times a day (BID) | CUTANEOUS | 0 refills | Status: DC

## 2022-01-01 MED ORDER — NYSTATIN 100000 UNIT/GM EX OINT: 1.0000 | TOPICAL_OINTMENT | Freq: Two times a day (BID) | CUTANEOUS | 0 refills | Status: DC

## 2022-01-01 NOTE — Progress Notes (Signed)
Attempted to contact patient to reschedule appointment for medication management. Left HIPAA compliant message for patient to return my call at their convenience.   Three unsuccessful outreach attempts by the care team. Will re-engage with patient with new PCP referral or patient call.   Catie Eppie Gibson, PharmD, [facility] [phone]

## 2022-01-09 DIAGNOSIS — H524 Presbyopia: Secondary | ICD-10-CM | POA: Diagnosis not present

## 2022-01-20 NOTE — Telephone Encounter (Signed)
Pharmacy confirmed that they did not have any alternatives for estrace cream from insurance.  Pt informed about GoodRx and cheapest OOP costs is at [facility]. Pt offered discounted OOP price. Pt voiced understanding and feels like she could handle that.   Will send Rx to [facility] per pt's request and will forward her the URL link to Good Rx coupon through [place].   Pt also reports that it seems like her internal vaginal sxs have not resolved. I inquired if pt ever used the terconazole cream that was advised by ML at visit in 12/2021. Pt reports no, she still had Rx. Pt  informed to complete Rx and then inform us after completion if still having vaginal sxs.

## 2022-01-21 MED ORDER — ESTRADIOL 0.1 MG/GM VA CREA: TOPICAL_CREAM | VAGINAL | 2 refills | Status: DC

## 2022-01-21 NOTE — Telephone Encounter (Signed)
Since [facility] as an e-prescribe error. I called pharmacy and left VM stating that pt will not be picking up estradiol cream from their pharmacy and to go ahead and discontinue it from their system.

## 2022-02-02 ENCOUNTER — Other Ambulatory Visit: Payer: Self-pay | Admitting: Obstetrics and Gynecology

## 2022-02-02 NOTE — Patient Instructions (Signed)
Hi Ms. Feld, I hope you are doing well-sorry to have missed you today- as a part of your Medicaid benefit, you are eligible for care management and care coordination services at no cost or copay. I was unable to reach you by phone today but would be happy to help you with your health related needs. Please feel free to call me at [phone].  A member of the Managed Medicaid care management team will reach out to you again over the next 30 business days.   [name] RN, BSN [place]  [facility] - Managed Medicaid High Risk [phone].

## 2022-02-02 NOTE — Patient Outreach (Signed)
Care Coordination  02/02/2022  Angelica Berg [birth year] [id number]   Medicaid Managed Care   Unsuccessful Outreach Note  02/02/2022 Name: Angelica Berg MRN: [id number] DOB: [birth year]  Referred by: [name], [name], MD Reason for referral : High Risk Managed Medicaid (Unsuccessful telephone outreach)   A second unsuccessful telephone outreach was attempted today. The patient was referred to the case management team for assistance with care management and care coordination.   Follow Up Plan: The care management team will reach out to the patient again over the next 30 business  days.   [name] RN, BSN [place]  [facility] - Managed Medicaid High Risk [phone]

## 2022-02-03 ENCOUNTER — Other Ambulatory Visit: Payer: Self-pay | Admitting: Obstetrics and Gynecology

## 2022-02-03 ENCOUNTER — Telehealth: Payer: Self-pay | Admitting: Internal Medicine

## 2022-02-03 NOTE — Telephone Encounter (Signed)
..   Medicaid Managed Care   Unsuccessful Outreach Note  02/03/2022 Name: Angelica Berg MRN: [id number] DOB: [birth year]  Referred by: [name], [name], MD Reason for referral : High Risk Managed Medicaid (I called the patient today to get her rescheduled with the MM RNCM. I left my name and number on her VM.)   A second unsuccessful telephone outreach was attempted today. The patient was referred to the case management team for assistance with care management and care coordination.   Follow Up Plan: The care management team will reach out to the patient again over the next 7 days.    [place]

## 2022-02-03 NOTE — Patient Outreach (Signed)
Care Coordination  02/03/2022  Angelica Berg January 11, 1976 [id number]  RNCM returned patient's phone call and answered questions, has appointments scheduled.  [name] RN, BSN [place]  [facility] - Managed Medicaid High Risk [phone]

## 2022-02-04 ENCOUNTER — Other Ambulatory Visit: Payer: Self-pay

## 2022-02-04 NOTE — Patient Instructions (Signed)
Visit Information  [name] was given information about Medicaid Managed Care team care coordination services as a part of their [place] Medicaid benefit. Angelica Berg verbally consented to engagement with the [facility] Managed Care team.   If you are experiencing a medical emergency, please call 911 or report to your local emergency department or urgent care.   If you have a non-emergency medical problem during routine business hours, please contact your provider's office and ask to speak with a nurse.   For questions related to your [facility], please call: [phone] or visit the homepage here: [email or web address]  If you would like to schedule transportation through your [facility], please call the following number at least 2 days in advance of your appointment: [phone]   Rides for urgent appointments can also be made after hours by calling Member Services.  Call the [place] at [phone], at any time, 24 hours a day, 7 days a week. If you are in danger or need immediate medical attention call 911.  If you would like help to quit smoking, call 1-800-QUIT-NOW 647 471 4092) OR Espaol: 1-855-Djelo-Ya (3-893-734-2876) o para ms informacin haga clic aqu or Text READY to 200-400 to register via text  [name] - following are the goals we discussed in your visit today:   Goals Addressed   None      Social Worker will follow up in 14 days .   [name], BSW, [place] Managed Medicaid Team  [phone]   Following is a copy of your plan of care:  There are no care plans that you recently modified to display for this patient.

## 2022-02-04 NOTE — Patient Outreach (Signed)
Medicaid Managed Care Social Work Note  02/04/2022 Name:  Angelica Berg MRN:  [id number] DOB:  1975-11-04  [name] is an 46 y.o. year old female who is a primary patient of [name], [name], MD.  The Medicaid Managed Care Coordination team was consulted for assistance with:  Community Resources   Ms. Jocelyn was given information about Medicaid Managed Care Coordination team services today. [name] Patient agreed to services and verbal consent obtained.  Engaged with patient  for by telephone forfollow up visit in response to referral for case management and/or care coordination services.   Assessments/Interventions:  Review of past medical history, allergies, medications, health status, including review of consultants reports, laboratory and other test data, was performed as part of comprehensive evaluation and provision of chronic care management services.  SDOH: (Social Determinant of Health) assessments and interventions performed: SDOH Interventions    Flowsheet Row Patient Outreach Telephone from 02/03/2022 in [place] Coordination Office Visit from 11/25/2021 in [place] at [place] Patient Outreach Telephone from 11/12/2021 in [place] Patient Outreach Telephone from 08/18/2021 in [place] Patient Outreach Telephone from 04/22/2021 in [place] Video Visit from 11/13/2020 in [place] at [place] Interventions -- -- -- -- --  [bsw referral food stamps] --  Housing Interventions -- -- Other (Comment)  [refer to SW] -- --  [referred to BSW] --  Transportation Interventions -- -- -- -- Intervention Not Indicated --  Utilities Interventions Intervention Not Indicated -- -- -- -- --  Alcohol Usage Interventions Intervention Not Indicated (Score  <7) -- -- -- -- --  Depression Interventions/Treatment  -- Currently on Treatment -- -- -- Currently on Treatment  Stress Interventions -- -- -- Other (Comment)  [patient is speaking with MMSW] -- --     BSW completed a telephone outreach with patient. She stated she is currently moving her things into storage from her previous residence. She was trying to renew her Medicaid but kept getting an error message. BSW informed patient to contact DSS for the renewal and provided telephone number [phone]. Patient stated she does need some assistance with rent and utilities. BSW emailed patient a list of resources.   Advanced Directives Status:  Not addressed in this encounter.  Care Plan                 No Known Allergies  Medications Reviewed Today     Reviewed by [name], RN (Registered Nurse) on 02/03/22 at 1604  Med List Status: <None>   Medication Order Taking? Sig Documenting Provider Last Dose Status Informant  amoxicillin (AMOXIL) 500 MG tablet [id number]  Take 1 tablet (500 mg total) by mouth 3 (three) times daily. Princess Bruins, MD  Active   atorvastatin (LIPITOR) 80 MG tablet [id number] No TAKE ONE TABLET BY MOUTH DAILY [name], [name], MD Taking Active   Blood Glucose Monitoring Suppl (ACCU-CHEK GUIDE) w/Device KIT [id number] No 1 Device by Does not apply route daily. E11.9 [name], [name], MD Taking Active            Med Note [name], Toney Reil Aug 04, 2021 11:03 AM)    Blood Glucose Monitoring Suppl [facility] VERIO) w/Device KIT [id number] No 1 Device by Does not apply route as directed. [name], [name], MD Taking Active  estradiol (ESTRACE) 0.1 MG/GM vaginal cream [id number]  Insert one gram into vagina twice weekly. Princess Bruins, MD  Active   glucose blood (ACCU-CHEK GUIDE) test strip [id number] No Use as instructed to test blood sugar 2 times daily E11.9 [name], [name], MD Taking Active   ibuprofen (ADVIL)  800 MG tablet [id number] No Take 1 tablet (800 mg total) by mouth every 8 (eight) hours as needed. [name], MD Taking Active   lisinopril (ZESTRIL) 5 MG tablet [id number] No TAKE ONE TABLET BY MOUTH DAILY [name], [name], MD Taking Active   metFORMIN (GLUCOPHAGE) 1000 MG tablet [id number] No TAKE ONE TABLET BY MOUTH AT BEDTIME [name], [name], MD Taking Active   nystatin ointment (MYCOSTATIN) [id number]  Apply 1 Application topically 2 (two) times daily. Princess Bruins, MD  Active   triamcinolone ointment (KENALOG) 0.1 % [id number]  Apply 1 Application topically 2 (two) times daily. Princess Bruins, MD  Active   TRULICITY 1.5 IO/0.3TD [facility] [facility] [id number] No INJECT 1.5 MG UNDER THE SKIN ONCE WEEKLY [name], [name], MD Taking Active   Vitamin D, Ergocalciferol, (DRISDOL) 1.25 MG (50000 UNIT) CAPS capsule [id number] No Take 1 capsule (50,000 Units total) by mouth every 7 (seven) days for 12 doses. [name], [name], MD Taking Active             Patient Active Problem List   Diagnosis Date Noted   Pain in left foot 12/31/2021   Pain in left knee 05/23/2019   Morbid obesity ([place]) 08/30/2018   Vitamin D deficiency 08/30/2018   Hyperlipidemia associated with type 2 diabetes mellitus ([place]) 08/30/2018   Transaminitis 08/30/2018    Conditions to be addressed/monitored per PCP order:   community resources  There are no care plans that you recently modified to display for this patient.   Follow up:  Patient agrees to Care Plan and Follow-up.  Plan: The Managed Medicaid care management team will reach out to the patient again over the next 14 days.  Date/time of next scheduled Social Work care management/care coordination outreach:  02/24/22  [name], Arita Miss, [place] Medicaid Team  [phone]

## 2022-02-09 ENCOUNTER — Ambulatory Visit: Payer: Self-pay

## 2022-02-09 ENCOUNTER — Ambulatory Visit (INDEPENDENT_AMBULATORY_CARE_PROVIDER_SITE_OTHER): Payer: Medicaid Other | Admitting: Physician Assistant

## 2022-02-09 ENCOUNTER — Encounter: Payer: Self-pay | Admitting: Physician Assistant

## 2022-02-09 DIAGNOSIS — M79672 Pain in left foot: Secondary | ICD-10-CM

## 2022-02-09 NOTE — Progress Notes (Signed)
Office Visit Note   Patient: Angelica Berg           Date of Birth: 09/15/1975           MRN: [id number] Visit Date: 02/09/2022              Requested by: [name], [name], MD [place],  [place] [address] PCP: [name], [name], MD  Chief Complaint  Patient presents with   Left Foot - Follow-up      HPI: Angelica Berg is a pleasant 46 year old woman who is now 4 weeks status post dropping a glass on her left foot.  She did not have any open injury.  She did have nondisplaced crush fractures of the third fourth and fifth toes.  She has been wearing a postop shoe.  She says when she tries to wear regular shoe it does bother her.  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  She does feel better with the shoe on.  Mostly her fifth toe is the most painful  Assessment & Plan: Visit Diagnoses:  1. Pain in left foot     Plan: She has some soft tissue swelling but I do not see any sign of cellulitis or infection.  She has a strong dorsalis pedis pulse and good capillary refill.  She does have a history of uncontrolled diabetes.  She will continue with the postop shoe.  Encouraged her to try Voltaren gel topically to see if this alleviates any of her symptoms would like to see her back in 2 weeks or sooner if she is concerned  Follow-Up Instructions: No follow-ups on file.   Ortho Exam  Patient is alert, oriented, no adenopathy, well-dressed, normal affect, normal respiratory effort. Examination of her foot she has a strong dorsalis pedis pulse.  She has some swelling of the fifth digit.  There is no erythema or induration.  No ascending cellulitis.  She has good sensation and brisk capillary refill.  No clinical deformity she has tenderness to palpation over the fifth toe  Imaging: No results found. No images are attached to the encounter.  Labs: Lab Results  Component Value Date   HGBA1C 11.0 (H) 11/25/2021   HGBA1C 9.5 (A) 08/25/2021    HGBA1C 8.4 (A) 02/25/2021   ESRSEDRATE 9 08/26/2020   LABURIC 4.7 08/26/2020   LABURIC 5.4 04/07/2008   LABURIC 4.7 04/02/2008   REPTSTATUS 10/13/2011 FINAL 10/12/2011   CULT  10/12/2011    Multiple bacterial morphotypes present, none predominant. Suggest appropriate recollection if clinically indicated.     Lab Results  Component Value Date   ALBUMIN 4.2 11/25/2021   ALBUMIN 4.0 08/21/2020   ALBUMIN 4.3 08/16/2019    No results found for: "MG" Lab Results  Component Value Date   VD25OH 8.69 (L) 11/25/2021   VD25OH 11.36 (L) 08/21/2020   VD25OH 18 (L) 11/15/2019    No results found for: "PREALBUMIN"    Latest Ref Rng & Units 11/25/2021    9:29 AM 08/21/2020    9:45 AM 08/16/2019    9:37 AM  CBC EXTENDED  WBC 4.0 - 10.5 K/uL 5.0  6.7  6.5   RBC 3.87 - 5.11 Mil/uL 4.45  4.12  4.40   Hemoglobin 12.0 - 15.0 g/dL 13.7  13.0  13.8   HCT 36.0 - 46.0 % 40.1  38.3  40.3   Platelets 150.0 - 400.0 K/uL 263.0  267.0  267.0   NEUT#  1.4 - 7.7 K/uL 2.1  2.6  2.4   Lymph# 0.7 - 4.0 K/uL 2.3  3.3  3.6      There is no height or weight on file to calculate BMI.  Orders:  Orders Placed This Encounter  Procedures   XR Foot Complete Left   No orders of the defined types were placed in this encounter.    Procedures: No procedures performed  Clinical Data: No additional findings.  ROS:  All other systems negative, except as noted in the HPI. Review of Systems  All other systems reviewed and are negative.   Objective: Vital Signs: There were no vitals taken for this visit.  Specialty Comments:  No specialty comments available.  PMFS History: Patient Active Problem List   Diagnosis Date Noted   Pain in left foot 12/31/2021   Pain in left knee 05/23/2019   Morbid obesity (HCC) 08/30/2018   Vitamin D deficiency 08/30/2018   Hyperlipidemia associated with type 2 diabetes mellitus (HCC) 08/30/2018   Transaminitis 08/30/2018   Past Medical History:  Diagnosis Date    Diabetes mellitus (HCC)    Dyslipidemia     Family History  Problem Relation Age of Onset   Diabetes Mother    Hypertension Mother    Cancer Maternal Grandmother        throat   Heart disease Maternal Grandmother     Past Surgical History:  Procedure Laterality Date   CESAREAN SECTION     KNEE ARTHROCENTESIS     Social History   Occupational History   Not on file  Tobacco Use   Smoking status: Former    Types: Cigarettes    Quit date: 06/12/2018    Years since quitting: 3.6   Smokeless tobacco: Never  Vaping Use   Vaping Use: Never used  Substance and Sexual Activity   Alcohol use: Yes   Drug use: Yes    Frequency: 7.0 times per week    Types: Marijuana   Sexual activity: Not Currently

## 2022-02-24 ENCOUNTER — Other Ambulatory Visit: Payer: Self-pay

## 2022-02-24 NOTE — Patient Instructions (Signed)
Visit Information  Angelica Berg was given information about Medicaid Managed Care team care coordination services as a part of their [place] Medicaid benefit. [name] verbally consented to engagement with the [facility] Managed Care team.   If you are experiencing a medical emergency, please call 911 or report to your local emergency department or urgent care.   If you have a non-emergency medical problem during routine business hours, please contact your provider's office and ask to speak with a nurse.   For questions related to your [facility], please call: [phone] or visit the homepage here: [email or web address]  If you would like to schedule transportation through your [facility], please call the following number at least 2 days in advance of your appointment: [phone]   Rides for urgent appointments can also be made after hours by calling Member Services.  Call the [place] at [phone], at any time, 24 hours a day, 7 days a week. If you are in danger or need immediate medical attention call 911.  If you would like help to quit smoking, call 1-800-QUIT-NOW 234 100 6303) OR Espaol: 1-855-Djelo-Ya (7-342-876-8115) o para ms informacin haga clic aqu or Text READY to 200-400 to register via text  [name] - following are the goals we discussed in your visit today:   Goals Addressed   None      Social Worker will follow up in 30 days.   [name], BSW, [place] Managed Medicaid Team  [phone]   Following is a copy of your plan of care:  There are no care plans that you recently modified to display for this patient.

## 2022-02-24 NOTE — Patient Outreach (Signed)
Medicaid Managed Care Social Work Note  02/24/2022 Name:  Angelica Berg MRN:  [id number] DOB:  1975/05/26  Angelica Berg is an 46 y.o. year old female who is a primary patient of [name], [name], MD.  The Medicaid Managed Care Coordination team was consulted for assistance with:  Community Resources   Angelica Berg was given information about Medicaid Managed Care Coordination team services today. Angelica Berg Patient agreed to services and verbal consent obtained.  Engaged with patient  for by telephone forfollow up visit in response to referral for case management and/or care coordination services.   Assessments/Interventions:  Review of past medical history, allergies, medications, health status, including review of consultants reports, laboratory and other test data, was performed as part of comprehensive evaluation and provision of chronic care management services.  SDOH: (Social Determinant of Health) assessments and interventions performed: SDOH Interventions    Flowsheet Row Patient Outreach Telephone from 02/03/2022 in [place] Coordination Office Visit from 11/25/2021 in [place] at [place] Patient Outreach Telephone from 11/12/2021 in [place] Patient Outreach Telephone from 08/18/2021 in [place] Patient Outreach Telephone from 04/22/2021 in [place] Video Visit from 11/13/2020 in [place] at [place] Interventions -- -- -- -- --  [bsw referral food stamps] --  Housing Interventions -- -- Other (Comment)  [refer to SW] -- --  [referred to BSW] --  Transportation Interventions -- -- -- -- Intervention Not Indicated --  Utilities Interventions Intervention Not Indicated -- -- -- -- --  Alcohol Usage Interventions Intervention Not Indicated (Score  <7) -- -- -- -- --  Depression Interventions/Treatment  -- Currently on Treatment -- -- -- Currently on Treatment  Stress Interventions -- -- -- Other (Comment)  [patient is speaking with MMSW] -- --     BSW completed a telephone outreach with patient. She stated her apartment complex does not accept any funds for outside agencies so her mother gave her money to pay her rent for this month. BSW has provided all resources to patient, but will continue to follow up and research any additional resources that may be able to help.  Advanced Directives Status:  Not addressed in this encounter.  Care Plan                 No Known Allergies  Medications Reviewed Today     Reviewed by Persons, Bevely Palmer, PA (Physician Assistant) on 02/09/22 at [place] List Status: <None>   Medication Order Taking? Sig Documenting Provider Last Dose Status Informant  amoxicillin (AMOXIL) 500 MG tablet [id number] Yes Take 1 tablet (500 mg total) by mouth 3 (three) times daily. Princess Bruins, MD Taking Active   atorvastatin (LIPITOR) 80 MG tablet [id number] Yes TAKE ONE TABLET BY MOUTH DAILY [name], [name], MD Taking Active   Blood Glucose Monitoring Suppl (ACCU-CHEK GUIDE) w/Device KIT [id number] Yes 1 Device by Does not apply route daily. E11.9 [name], [name], MD Taking Active            Med Note [name], Toney Reil Aug 04, 2021 11:03 AM)    Blood Glucose Monitoring Suppl [facility] VERIO) w/Device KIT [id number] Yes 1 Device by Does not apply route as directed. [name], [name], MD Taking Active   estradiol (ESTRACE) 0.1 MG/GM vaginal cream [id number] Yes Insert  one gram into vagina twice weekly. Princess Bruins, MD Taking Active   glucose blood (ACCU-CHEK GUIDE) test strip [id number] Yes Use as instructed to test blood sugar 2 times daily E11.9 [name], [name], MD Taking Active   ibuprofen (ADVIL) 800 MG tablet [id number] Yes Take 1 tablet (800 mg total) by  mouth every 8 (eight) hours as needed. [name], MD Taking Active   lisinopril (ZESTRIL) 5 MG tablet [id number] Yes TAKE ONE TABLET BY MOUTH DAILY [name], [name], MD Taking Active   metFORMIN (GLUCOPHAGE) 1000 MG tablet [id number] Yes TAKE ONE TABLET BY MOUTH AT BEDTIME [name], [name], MD Taking Active   nystatin ointment (MYCOSTATIN) [id number] Yes Apply 1 Application topically 2 (two) times daily. Princess Bruins, MD Taking Active   triamcinolone ointment (KENALOG) 0.1 % [id number] Yes Apply 1 Application topically 2 (two) times daily. Princess Bruins, MD Taking Active   TRULICITY 1.5 HM/0.9OB Bon[place] Aid [id number] Yes INJECT 1.5 MG UNDER THE SKIN ONCE WEEKLY [name], [name], MD Taking Active   Vitamin D, Ergocalciferol, (DRISDOL) 1.25 MG (50000 UNIT) CAPS capsule [id number] Yes Take 1 capsule (50,000 Units total) by mouth every 7 (seven) days for 12 doses. [name], [name], MD Taking Active             Patient Active Problem List   Diagnosis Date Noted   Pain in left foot 12/31/2021   Pain in left knee 05/23/2019   Morbid obesity ([place]) 08/30/2018   Vitamin D deficiency 08/30/2018   Hyperlipidemia associated with type 2 diabetes mellitus ([place]) 08/30/2018   Transaminitis 08/30/2018    Conditions to be addressed/monitored per PCP order:   community resources  There are no care plans that you recently modified to display for this patient.   Follow up:  Patient agrees to Care Plan and Follow-up.  Plan: The Managed Medicaid care management team will reach out to the patient again over the next 30 days.  Date/time of next scheduled Social Work care management/care coordination outreach:  03/26/22  [name], Arita Miss, [place] Medicaid Team  [phone]

## 2022-02-25 ENCOUNTER — Ambulatory Visit (INDEPENDENT_AMBULATORY_CARE_PROVIDER_SITE_OTHER): Payer: Medicaid Other | Admitting: Internal Medicine

## 2022-02-25 ENCOUNTER — Encounter: Payer: Self-pay | Admitting: Internal Medicine

## 2022-02-25 VITALS — BP 136/94 | HR 76 | Temp 98.4°F | Wt 243.7 lb

## 2022-02-25 DIAGNOSIS — E1159 Type 2 diabetes mellitus with other circulatory complications: Secondary | ICD-10-CM

## 2022-02-25 DIAGNOSIS — E785 Hyperlipidemia, unspecified: Secondary | ICD-10-CM | POA: Diagnosis not present

## 2022-02-25 DIAGNOSIS — E1121 Type 2 diabetes mellitus with diabetic nephropathy: Secondary | ICD-10-CM

## 2022-02-25 DIAGNOSIS — I152 Hypertension secondary to endocrine disorders: Secondary | ICD-10-CM

## 2022-02-25 DIAGNOSIS — E1169 Type 2 diabetes mellitus with other specified complication: Secondary | ICD-10-CM

## 2022-02-25 LAB — POCT GLYCOSYLATED HEMOGLOBIN (HGB A1C): Hemoglobin A1C: 12.6 % — AB (ref 4.0–5.6)

## 2022-02-25 MED ORDER — AMLODIPINE BESYLATE 5 MG PO TABS: 5.0000 mg | ORAL_TABLET | Freq: Every day | ORAL | 1 refills | Status: DC

## 2022-02-25 NOTE — Progress Notes (Signed)
Established Patient Office Visit     CC/Reason for Visit: 28-monthfollow-up chronic medical conditions  HPI: RFERRELL FLAMis a 46y.o. female who is coming in today for the above mentioned reasons. Past Medical History is significant for: Hypertension, hyperlipidemia, obesity, type 2 diabetes.  She has a history of nonadherence.  She had some housing issues but tells me that she has now moved into a new apartment.  She quit taking her Trulicity and having Trulicity at home.  She denies any side effects.  She has been compliant with her metformin, atorvastatin and lisinopril.  She has had repeated yeast infections needing referral to GYN.  She also recently injured her toe and is wearing an orthopedic shoe.   Past Medical/Surgical History: Past Medical History:  Diagnosis Date   Diabetes mellitus ([place]    Dyslipidemia     Past Surgical History:  Procedure Laterality Date   CESAREAN SECTION     KNEE ARTHROCENTESIS      Social History:  reports that she quit smoking about 3 years ago. Her smoking use included cigarettes. She has never used smokeless tobacco. She reports current alcohol use. She reports current drug use. Frequency: 7.00 times per week. Drug: Marijuana.  Allergies: No Known Allergies  Family History:  Family History  Problem Relation Age of Onset   Diabetes Mother    Hypertension Mother    Cancer Maternal Grandmother        throat   Heart disease Maternal Grandmother      Current Outpatient Medications:    amLODipine (NORVASC) 5 MG tablet, Take 1 tablet (5 mg total) by mouth daily., Disp: 90 tablet, Rfl: 1   amoxicillin (AMOXIL) 500 MG tablet, Take 1 tablet (500 mg total) by mouth 3 (three) times daily., Disp: 14 tablet, Rfl: 0   atorvastatin (LIPITOR) 80 MG tablet, TAKE ONE TABLET BY MOUTH DAILY, Disp: 90 tablet, Rfl: 0   Blood Glucose Monitoring Suppl (ACCU-CHEK GUIDE) w/Device KIT, 1 Device by Does not apply route daily. E11.9, Disp: 1 kit, Rfl:  0   Blood Glucose Monitoring Suppl (ONETOUCH VERIO) w/Device KIT, 1 Device by Does not apply route as directed., Disp: 1 kit, Rfl: 0   estradiol (ESTRACE) 0.1 MG/GM vaginal cream, Insert one gram into vagina twice weekly., Disp: 42.5 g, Rfl: 2   glucose blood (ACCU-CHEK GUIDE) test strip, Use as instructed to test blood sugar 2 times daily E11.9, Disp: 100 each, Rfl: 12   ibuprofen (ADVIL) 800 MG tablet, Take 1 tablet (800 mg total) by mouth every 8 (eight) hours as needed., Disp: 90 tablet, Rfl: 2   lisinopril (ZESTRIL) 5 MG tablet, TAKE ONE TABLET BY MOUTH DAILY, Disp: 90 tablet, Rfl: 1   metFORMIN (GLUCOPHAGE) 1000 MG tablet, TAKE ONE TABLET BY MOUTH AT BEDTIME, Disp: 90 tablet, Rfl: 1   nystatin ointment (MYCOSTATIN), Apply 1 Application topically 2 (two) times daily., Disp: 30 g, Rfl: 0   triamcinolone ointment (KENALOG) 0.1 %, Apply 1 Application topically 2 (two) times daily., Disp: 30 g, Rfl: 0   TRULICITY 1.5 MUX/3.2TFSOPN, INJECT 1.5 MG UNDER THE SKIN ONCE WEEKLY (Patient not taking: Reported on 02/25/2022), Disp: 6 mL, Rfl: 2  Review of Systems:  Constitutional: Denies fever, chills, diaphoresis, appetite change. HEENT: Denies photophobia, eye pain, redness, hearing loss, ear pain, congestion, sore throat, rhinorrhea, sneezing, mouth sores, trouble swallowing, neck pain, neck stiffness and tinnitus.   Respiratory: Denies SOB, DOE, cough, chest tightness,  and wheezing.  Cardiovascular: Denies chest pain, palpitations and leg swelling.  Gastrointestinal: Denies nausea, vomiting, abdominal pain, diarrhea, constipation, blood in stool and abdominal distention.  Genitourinary: Denies dysuria, urgency, frequency, hematuria, flank pain and difficulty urinating.  Endocrine: Denies: hot or cold intolerance, sweats, changes in hair or nails, polyuria, polydipsia.  Skin: Denies pallor, rash and wound.  Neurological: Denies dizziness, seizures, syncope, weakness, light-headedness, numbness and  headaches.  Hematological: Denies adenopathy. Easy bruising, personal or family bleeding history  Psychiatric/Behavioral: Denies suicidal ideation, mood changes, confusion, nervousness, sleep disturbance and agitation    Physical Exam: Vitals:   02/25/22 0928 02/25/22 0931  BP: (!) 133/95 (!) 136/94  Pulse: 76   Temp: 98.4 F (36.9 C)   TempSrc: Oral   Weight: 243 lb 11.2 oz (110.5 kg)     Body mass index is 35.99 kg/m.   Constitutional: NAD, calm, comfortable Eyes: PERRL, lids and conjunctivae normal, wears corrective lenses ENMT: Mucous membranes are moist.  Respiratory: clear to auscultation bilaterally, no wheezing, no crackles. Normal respiratory effort. No accessory muscle use.  Cardiovascular: Regular rate and rhythm, no murmurs / rubs / gallops. No extremity edema.  Psychiatric: Normal judgment and insight. Alert and oriented x 3. Normal mood.    Impression and Plan:  Type 2 diabetes mellitus with diabetic nephropathy, without long-term current use of insulin ([place]) - Plan: POCT glycosylated hemoglobin (Hb A1C)  Hyperlipidemia associated with type 2 diabetes mellitus ([place])  Hypertension associated with diabetes ([place]) - Plan: amLODipine (NORVASC) 5 MG tablet  -A1c has increased to 12.6, for reasons that are not clear to me she quit taking the Trulicity.  She states she has at least 2 boxes in her fridge at home.  She agrees to resume. -She is taking her atorvastatin, check lipids when she returns. -With elevated blood pressures in last few visits I will go ahead and make this diagnosis today.  Add amlodipine 5 mg daily, return in 3 months for follow-up.  Time spent:31 minutes reviewing chart, interviewing and examining patient and formulating plan of care.      Lelon Frohlich, MD  Primary Care at [facility]

## 2022-03-16 ENCOUNTER — Ambulatory Visit: Payer: Medicaid Other | Admitting: Obstetrics and Gynecology

## 2022-03-30 ENCOUNTER — Other Ambulatory Visit: Payer: Medicaid Other

## 2022-03-30 ENCOUNTER — Encounter: Payer: Self-pay | Admitting: Obstetrics and Gynecology

## 2022-03-30 ENCOUNTER — Other Ambulatory Visit: Payer: Self-pay | Admitting: Internal Medicine

## 2022-03-30 ENCOUNTER — Other Ambulatory Visit: Payer: Medicaid Other | Admitting: Obstetrics and Gynecology

## 2022-03-30 NOTE — Patient Outreach (Addendum)
Medicaid Managed Care   Nurse Care Manager Note  03/30/2022 Name:  Angelica Berg MRN:  [id number] DOB:  1976/02/10  Angelica Berg is an 46 y.o. year old female who is a primary patient of [name], [name], MD.  The [facility] Managed Care Coordination team was consulted for assistance with:    Chronic healthcare management needs, DM, HTN, HLD  Ms. Tallman was given information about Medicaid Managed Care Coordination team services today. Angelica Berg Patient agreed to services and verbal consent obtained.  Engaged with patient by telephone for follow up visit in response to provider referral for case management and/or care coordination services.   Assessments/Interventions:  Review of past medical history, allergies, medications, health status, including review of consultants reports, laboratory and other test data, was performed as part of comprehensive evaluation and provision of chronic care management services.  SDOH (Social Determinants of Health) assessments and interventions performed: SDOH Interventions    Flowsheet Row Patient Outreach Telephone from 03/30/2022 in [place] Patient Outreach Telephone from 02/03/2022 in [place] Coordination Office Visit from 11/25/2021 in [place] [facility] [place] Patient Outreach Telephone from 11/12/2021 in [place] Patient Outreach Telephone from 08/18/2021 in [employer] Patient Outreach Telephone from 04/22/2021 in [place] Coordination  SDOH Interventions        Food Insecurity Interventions -- -- -- -- -- --  [bsw referral food stamps]  Housing Interventions -- -- -- Other (Comment)  [refer to SW] -- --  [referred to BSW]  Transportation Interventions -- -- -- -- -- Intervention Not Indicated  Utilities Interventions -- Intervention Not Indicated -- --  -- --  Alcohol Usage Interventions -- Intervention Not Indicated (Score <7) -- -- -- --  Depression Interventions/Treatment  -- -- Currently on Treatment -- -- --  Physical Activity Interventions Intervention Not Indicated -- -- -- -- --  Stress Interventions Patient Refused  [declines SW services] -- -- -- Other (Comment)  [patient is speaking with MMSW] --      Care Plan  No Known Allergies  Medications Reviewed Today     Reviewed by [name], RN (Registered Nurse) on 03/30/22 at 1109  Med List Status: <None>   Medication Order Taking? Sig Documenting Provider Last Dose Status Informant  amLODipine (NORVASC) 5 MG tablet [id number]  Take 1 tablet (5 mg total) by mouth daily. [name], [name], MD  Active   amoxicillin (AMOXIL) 500 MG tablet [id number]  Take 1 tablet (500 mg total) by mouth 3 (three) times daily. Princess Bruins, MD  Active   atorvastatin (LIPITOR) 80 MG tablet [id number]  TAKE ONE TABLET BY MOUTH DAILY [name], [name], MD  Active   Blood Glucose Monitoring Suppl (ACCU-CHEK GUIDE) w/Device KIT [id number]  1 Device by Does not apply route daily. E11.9 [name], [name], MD  Active            Med Note [name], Toney Reil Aug 04, 2021 11:03 AM)    Blood Glucose Monitoring Suppl [facility] VERIO) w/Device KIT [id number]  1 Device by Does not apply route as directed. [name], [name], MD  Active   estradiol (ESTRACE) 0.1 MG/GM vaginal cream [id number]  Insert one gram into vagina twice weekly. Princess Bruins, MD  Active   glucose blood (ACCU-CHEK GUIDE) test strip [id number]  Use as instructed to test blood sugar 2 times daily  E11.9 [name], [name], MD  Active   ibuprofen (ADVIL) 800 MG tablet [id number]  Take 1 tablet (800 mg total) by mouth every 8 (eight) hours as needed. [name], MD  Active   lisinopril (ZESTRIL) 5 MG tablet [id number] No TAKE ONE TABLET BY MOUTH DAILY  Patient not taking: Reported on  03/30/2022   [name], [name], MD Not Taking Active   metFORMIN (GLUCOPHAGE) 1000 MG tablet [id number] No TAKE ONE TABLET BY MOUTH AT BEDTIME  Patient not taking: Reported on 03/30/2022   [name], [name], MD Not Taking Active   nystatin ointment (MYCOSTATIN) [id number]  Apply 1 Application topically 2 (two) times daily. Princess Bruins, MD  Active   triamcinolone ointment (KENALOG) 0.1 % [id number]  Apply 1 Application topically 2 (two) times daily. Princess Bruins, MD  Active   TRULICITY 1.5 GG/8.3MO Bonney Aid [id number] Yes INJECT 1.5 MG UNDER THE SKIN ONCE WEEKLY [name], [name], MD Taking Active            Patient Active Problem List   Diagnosis Date Noted   Hypertension associated with diabetes ([place]) 02/25/2022   Pain in left foot 12/31/2021   Pain in left knee 05/23/2019   Morbid obesity ([place]) 08/30/2018   Vitamin D deficiency 08/30/2018   Hyperlipidemia associated with type 2 diabetes mellitus ([place]) 08/30/2018   Transaminitis 08/30/2018   Conditions to be addressed/monitored per PCP order:  Chronic healthcare management needs, DM, HTN, HLD  Care Plan : RN Care Manager Plan Of Care  Updates made by [name], RN since 03/30/2022 12:00 AM     Problem: Knowledge Deficit and Care Coordination Needs Related to Management of  DM, obesity and HLD   Priority: High     Long-Range Goal: Development of Plan Of Care to Address Care Coordination Needs and Knowledge Deficits for Management of DM, obesity, HLD   Start Date: 04/22/2021  Expected End Date: 06/29/2022  Priority: High  Note:   Current Barriers:  Knowledge Deficits related to plan of care for management of HLD, DMII, HTN and Obesity  03/30/22:  Patient not checking blood sugars or blood pressure.  She states she has started Trulicity, but has been out of Metformin and Lisinopril for 3 days-to pick up today.  Twin daughters turning 97 this month.  Left small toe healing after injuring-using  post op shoe and Voltaren gel. RNCM Clinical Goal(s):  Patient will verbalize basic understanding of HLD, DMII, and obesity disease process and self health management plan as evidenced by improved Hgb A1C, weight loss or no weight gain, normal lipid panel take all medications exactly as prescribed and will call provider for medication related questions as evidenced by patient reporting improvement in taken medications as prescribed    demonstrate understanding of rationale for each prescribed medication as evidenced by patient able to report to provider purpose of medication    attend all scheduled medical appointments: with primary care provider on 05/28/21 as evidenced by no missed appointments when appointment history reviewed in medical record         demonstrate improved adherence to prescribed treatment plan for HLD, DMII, and Obesity as evidenced by improved Hgb A1C, weight loss of no weight gain, improvement in lipid panel continue to work with Consulting civil engineer and/or Social Worker to address care management and care coordination needs related to HLD, DMII, and Obesity as evidenced by adherence to CM Team Scheduled appointments     through collaboration with RN  Care manager, provider, and care team.  Work with pharmacist to address inconsistent medication taking behavior and personal feeling regarding medications. i.e. "I don't like taking so many medications" and appropriateness of adding Tirzepatide to medication regimen in place of Trulicity  Interventions: Inter-disciplinary care team collaboration (see longitudinal plan of care) Evaluation of current treatment plan related to  self management and patient's adherence to plan as established by provider Care guide referral for dental resources-completed. Collaborated with Care Guide for resources. Encouraged patient to take all medications  Interdisciplinary Collaboration Interventions:  (Status: Goal on track:  NO.) Long Term Goal   - Collaborated with BSW to initiate plan of care to address needs related to Financial constraints related to limited income, Limited access to food, and Lacks knowledge of community resource: to address food and financial insecurity and dental resources  in patient with HLD, DMII, and Obesity Collaboration with [name], [name], MD regarding development and update of comprehensive plan of care as evidenced by provider attestation and co-signature Inter-disciplinary care team collaboration   Referral placed with the pharmacist to address medication taking behavior and patient's personal feelings of "I don't like taking so many medications"  Schedule initial telephone appointment with Managed Medicaid BSW [name] to address financial insecurities and patient's inability to pay rent Referral sent to community care guide requesting resources provided in January related to financial insecurity and utilities be resent to patient  07/28/21: BSW completed telephone outreach with patient regarding rent and financial insecurity. BSW explained what happened with her rent and stated she is going back to court on 08/06/21. Patient stated that she does need assistance with her back rent and utilities. Patient states her rent is about 8,000 and utilities are about 1500. BSW provided patient with the phone number for legal aid and encouraged patient to try the resources provided by the care guide.  08/01/21: BSW returned patients telephone call. She stated she spoke with CG and the email was not coming through. Patient stated she spoke with DSS and are case was closed. BSW informed patient she should reapply. Bsw will send a message to leadership about patient getting assistance.   Diabetes:  (Status: Goal on track: NO.) Long Term Goal - patient states she is taking Trulicity as prescribed and remains interested in referral to the [facility] [place]  Lab Results  Component Value Date    HGBA1C 8.4 (A) 02/25/2021       HGBA1C                                                      12.6                                   02/25/2022 Provided education to patient about basic DM disease process; Reviewed medications with patient and discussed importance of medication adherence;        Reviewed prescribed diet with patient CHO controlled; Discussed plans with patient for ongoing care management follow up and provided patient with direct contact information for care management team;             Hyperlipidemia:  (Status: Condition stable. Not addressed this visit.) Long Term Goal  Lab Results  Component Value Date  CHOL 113 08/21/2020   HDL 44.70 08/21/2020   LDLCALC 52 08/21/2020   TRIG 80.0 08/21/2020   CHOLHDL 3 08/21/2020    Medication review performed; medication list updated in electronic medical record.   Weight Loss:  (Status: Goal on track: NO.) Long term Goal-  Patient would like referral to [facility] [place] DM metabolic deficits including decreased satiety signaling to brain from intestines and newer medication Tirzepatide used to treat DM/ lipid/BP . Encouraged patient to discuss referral to [place] to assist her with weight management and glycemic control when she sees her primary care provider  Patient Goals/Self-Care Activities: Take medications as prescribed   Attend all scheduled provider appointments Call pharmacy for medication refills 3-7 days in advance of running out of medications Perform all self care activities independently  Perform IADL's (shopping, preparing meals, housekeeping, managing finances) independently Call provider office for new concerns or questions  set goal weight fill half of plate with vegetables manage portion size switch to sugar-free drinks - call for medicine refill 2 or 3 days before it runs out - take all medications exactly as prescribed - call doctor with  any symptoms you believe are related to your medicine - call doctor when you experience any new symptoms - go to all doctor appointments as scheduled - adhere to prescribed diet: CHO controlled - discuss referral to [facility] [place] with primary care provider   Long-Range Goal: Establish Plan of Care for Chronic Disease Management Needs   Priority: High  Note:   Timeframe:  Long-Range Goal Priority:  High Start Date:     08/18/21                        Expected End Date: ongoing                      Follow Up Date 05/04/22   - practice safe sex - schedule appointment for flu shot - schedule appointment for vaccines needed due to my age or health - schedule recommended health tests (blood work, mammogram, colonoscopy, pap test) - schedule and keep appointment for annual check-up    Why is this important?   Screening tests can find diseases early when they are easier to treat.  Your doctor or nurse will talk with you about which tests are important for you.  Getting shots for common diseases like the flu and shingles will help prevent them.   03/30/22:  Patient seen and evaluated by PCP 02/25/22   Follow Up:  Patient agrees to Care Plan and Follow-up.  Plan: The Managed Medicaid care management team will reach out to the patient again over the next 30 business  days. and The  Patient has been provided with contact information for the Managed Medicaid care management team and has been advised to call with any health related questions or concerns.  Date/time of next scheduled RN care management/care coordination outreach:  05/04/22 at 0900.

## 2022-03-30 NOTE — Patient Outreach (Signed)
  Medicaid Managed Care   Unsuccessful Outreach Note  03/30/2022 Name: Angelica Berg MRN: [id number] DOB: [birth year]  Referred by: Philip Aspen, Limmie Patricia, MD Reason for referral : High Risk Managed Medicaid ([employer] Social work telephone outreach )   An unsuccessful telephone outreach was attempted today. The patient was referred to the case management team for assistance with care management and care coordination.   Follow Up Plan: The care management team will reach out to the patient again over the next 30 days.   [name], BSW, [place] [facility]  [place]  High Risk Managed Medicaid Team  [phone]

## 2022-03-30 NOTE — Patient Instructions (Signed)
  Medicaid Managed Care   Unsuccessful Outreach Note  03/30/2022 Name: Angelica Berg MRN: [id number] DOB: 09/21/1975  Referred by: Hernandez Acosta, [name], MD Reason for referral : High Risk Managed Medicaid (Mm Social work telephone outreach )   An unsuccessful telephone outreach was attempted today. The patient was referred to the case management team for assistance with care management and care coordination.   Follow Up Plan: The care management team will reach out to the patient again over the next 30 days.   Salome Cozby, BSW, [facility]  [place]  High Risk Managed Medicaid Team  [phone]  

## 2022-03-30 NOTE — Patient Instructions (Signed)
Hi Angelica Berg glad I was able to speak with you this morning-don't forget to get your medications today!  Angelica Berg was given information about Medicaid Managed Care team care coordination services as a part of their [facility] Community Plan Medicaid benefit. Angelica Berg verbally consented to engagement with the [facility] Managed Care team.   If you are experiencing a medical emergency, please call 911 or report to your local emergency department or urgent care.   If you have a non-emergency medical problem during routine business hours, please contact your provider's office and ask to speak with a nurse.   For questions related to your [facility], please call: [phone] or visit the homepage here: [email or web address]  If you would like to schedule transportation through your [facility], please call the following number at least 2 days in advance of your appointment: [phone]   Rides for urgent appointments can also be made after hours by calling Member Services.  Call the Behavioral Health Crisis Line at [phone], at any time, 24 hours a day, 7 days a week. If you are in danger or need immediate medical attention call 911.  If you would like help to quit smoking, call 1-800-QUIT-NOW (423-857-6563) OR Espaol: 1-855-Djelo-Ya (4-270-623-7628) o para ms informacin haga clic aqu or Text READY to 315-176 to register via text  Angelica Berg - following are the goals we discussed in your visit today:   Goals Addressed    Timeframe:  Long-Range Goal Priority:  High Start Date:     08/18/21                        Expected End Date: ongoing                      Follow Up Date 05/02/22   - practice safe sex - schedule appointment for flu shot - schedule appointment for vaccines needed due to my age or health - schedule recommended health tests (blood work, mammogram,  colonoscopy, pap test) - schedule and keep appointment for annual check-up    Why is this important?   Screening tests can find diseases early when they are easier to treat.  Your doctor or nurse will talk with you about which tests are important for you.  Getting shots for common diseases like the flu and shingles will help prevent them.   03/30/22:  Patient seen and evaluated by PCP 02/25/22  Patient verbalizes understanding of instructions and care plan provided today and agrees to view in MyChart. Active MyChart status and patient understanding of how to access instructions and care plan via MyChart confirmed with patient.     The Managed Medicaid care management team will reach out to the patient again over the next 30 business  days.  The  Patient  has been provided with contact information for the Managed Medicaid care management team and has been advised to call with any health related questions or concerns.   [name] RN, BSN [place]  [facility] Care Management Coordinator - Managed Medicaid High Risk [phone]   Following is a copy of your plan of care:  Care Plan : RN Care Manager Plan Of Care  Updates made by [name], RN since 03/30/2022 12:00 AM     Problem: Knowledge Deficit and Care Coordination Needs Related to Management of  DM, obesity and HLD   Priority: High  Long-Range Goal: Development of Plan Of Care to Address Care Coordination Needs and Knowledge Deficits for Management of DM, obesity, HLD   Start Date: 04/22/2021  Expected End Date: 06/29/2022  Priority: High  Note:   Current Barriers:  Knowledge Deficits related to plan of care for management of HLD, DMII, HTN and Obesity  03/30/22:  Patient not checking blood sugars or blood pressure.  She states she has started Trulicity, but has been out of Metformin and Lisinopril for 3 days-to pick up today.  Twin daughters turning 25 this month.  Left small toe healing after  injuring-using post op shoe and Voltaren gel. RNCM Clinical Goal(s):  Patient will verbalize basic understanding of HLD, DMII, and obesity disease process and self health management plan as evidenced by improved Hgb A1C, weight loss or no weight gain, normal lipid panel take all medications exactly as prescribed and will call provider for medication related questions as evidenced by patient reporting improvement in taken medications as prescribed    demonstrate understanding of rationale for each prescribed medication as evidenced by patient able to report to provider purpose of medication    attend all scheduled medical appointments: with primary care provider on 05/28/21 as evidenced by no missed appointments when appointment history reviewed in medical record         demonstrate improved adherence to prescribed treatment plan for HLD, DMII, and Obesity as evidenced by improved Hgb A1C, weight loss of no weight gain, improvement in lipid panel continue to work with Consulting civil engineer and/or Social Worker to address care management and care coordination needs related to HLD, DMII, and Obesity as evidenced by adherence to CM Team Scheduled appointments     through collaboration with RN Care manager, provider, and care team.  Work with pharmacist to address inconsistent medication taking behavior and personal feeling regarding medications. i.e. "I don't like taking so many medications" and appropriateness of adding Tirzepatide to medication regimen in place of Trulicity  Interventions: Inter-disciplinary care team collaboration (see longitudinal plan of care) Evaluation of current treatment plan related to  self management and patient's adherence to plan as established by provider Care guide referral for dental resources-completed. Collaborated with Care Guide for resources. Encouraged patient to take all medications  Interdisciplinary Collaboration Interventions:  (Status: Goal on track:  NO.) Long Term  Goal  - Collaborated with BSW to initiate plan of care to address needs related to Financial constraints related to limited income, Limited access to food, and Lacks knowledge of community resource: to address food and financial insecurity and dental resources  in patient with HLD, DMII, and Obesity Collaboration with [name], [name], MD regarding development and update of comprehensive plan of care as evidenced by provider attestation and co-signature Inter-disciplinary care team collaboration   Referral placed with the pharmacist to address medication taking behavior and patient's personal feelings of "I don't like taking so many medications"  Schedule initial telephone appointment with Managed Medicaid BSW [name] to address financial insecurities and patient's inability to pay rent Referral sent to community care guide requesting resources provided in January related to financial insecurity and utilities be resent to patient  07/28/21: BSW completed telephone outreach with patient regarding rent and financial insecurity. BSW explained what happened with her rent and stated she is going back to court on 08/06/21. Patient stated that she does need assistance with her back rent and utilities. Patient states her rent is about 8,000 and utilities are about 1500. BSW provided patient  with the phone number for legal aid and encouraged patient to try the resources provided by the care guide.  08/01/21: BSW returned patients telephone call. She stated she spoke with CG and the email was not coming through. Patient stated she spoke with DSS and are case was closed. BSW informed patient she should reapply. Bsw will send a message to leadership about patient getting assistance.   Diabetes:  (Status: Goal on track: NO.) Long Term Goal - patient states she is taking Trulicity as prescribed and remains interested in referral to the [facility] [place]  Lab Results  Component Value  Date   HGBA1C 8.4 (A) 02/25/2021       HGBA1C                                                      12.6                                   02/25/2022 Provided education to patient about basic DM disease process; Reviewed medications with patient and discussed importance of medication adherence;        Reviewed prescribed diet with patient CHO controlled; Discussed plans with patient for ongoing care management follow up and provided patient with direct contact information for care management team;             Hyperlipidemia:  (Status: Condition stable. Not addressed this visit.) Long Term Goal  Lab Results  Component Value Date   CHOL 113 08/21/2020   HDL 44.70 08/21/2020   LDLCALC 52 08/21/2020   TRIG 80.0 08/21/2020   CHOLHDL 3 08/21/2020    Medication review performed; medication list updated in electronic medical record.   Weight Loss:  (Status: Goal on track: NO.) Long term Goal-  Patient would like referral to [facility] [place] DM metabolic deficits including decreased satiety signaling to brain from intestines and newer medication Tirzepatide used to treat DM/ lipid/BP . Encouraged patient to discuss referral to [facility] [place] to assist her with weight management and glycemic control when she sees her primary care provider  Patient Goals/Self-Care Activities: Take medications as prescribed   Attend all scheduled provider appointments Call pharmacy for medication refills 3-7 days in advance of running out of medications Perform all self care activities independently  Perform IADL's (shopping, preparing meals, housekeeping, managing finances) independently Call provider office for new concerns or questions  set goal weight fill half of plate with vegetables manage portion size switch to sugar-free drinks - call for medicine refill 2 or 3 days before it runs out - take all medications exactly as prescribed - call  doctor with any symptoms you believe are related to your medicine - call doctor when you experience any new symptoms - go to all doctor appointments as scheduled - adhere to prescribed diet: CHO controlled - discuss referral to [facility] [place] with primary care provider

## 2022-03-31 ENCOUNTER — Other Ambulatory Visit: Payer: Self-pay | Admitting: Internal Medicine

## 2022-05-04 ENCOUNTER — Other Ambulatory Visit: Payer: Medicaid Other | Admitting: Obstetrics and Gynecology

## 2022-05-04 ENCOUNTER — Encounter: Payer: Self-pay | Admitting: Obstetrics and Gynecology

## 2022-05-04 NOTE — Patient Instructions (Signed)
Hi Angelica Berg was a pleasure speaking with you this morning.  Please don't forget to follow up on those test strips.  Angelica Berg was given information about Medicaid Managed Care team care coordination services as a part of their [place] Medicaid benefit. Angelica Berg verbally consented to engagement with the [facility] Managed Care team.   If you are experiencing a medical emergency, please call 911 or report to your local emergency department or urgent care.   If you have a non-emergency medical problem during routine business hours, please contact your provider's office and ask to speak with a nurse.   For questions related to your [facility], please call: [phone] or visit the homepage here: [email or web address]  If you would like to schedule transportation through your [facility], please call the following number at least 2 days in advance of your appointment: [phone]   Rides for urgent appointments can also be made after hours by calling Member Services.  Call the [place] at [phone], at any time, 24 hours a day, 7 days a week. If you are in danger or need immediate medical attention call 911.  If you would like help to quit smoking, call 1-800-QUIT-NOW 603 512 4723) OR Espaol: 1-855-Djelo-Ya (0-630-160-1093) o para ms informacin haga clic aqu or Text READY to 200-400 to register via text  [name] - following are the goals we discussed in your visit today:   Goals Addressed    Timeframe:  Long-Range Goal Priority:  High Start Date:     08/18/21                        Expected End Date: ongoing                      Follow Up Date: 05/29/22  - practice safe sex - schedule appointment for flu shot - schedule appointment for vaccines needed due to my age or health - schedule recommended health tests (blood work,  mammogram, colonoscopy, pap test) - schedule and keep appointment for annual check-up    Why is this important?   Screening tests can find diseases early when they are easier to treat.  Your doctor or nurse will talk with you about which tests are important for you.  Getting shots for common diseases like the flu and shingles will help prevent them.    05/04/22:  Patient has follow up appt with PCP 05/27/22  Patient verbalizes understanding of instructions and care plan provided today and agrees to view in [place]. Active MyChart status and patient understanding of how to access instructions and care plan via MyChart confirmed with patient.     The Managed Medicaid care management team will reach out to the patient again over the next 30 business  days.  The  Patient has been provided with contact information for the Managed Medicaid care management team and has been advised to call with any health related questions or concerns.   Angelica Raider RN, BSN [place]  [facility] Curator - Managed Medicaid High Risk [phone].   Following is a copy of your plan of care:  Care Plan : Angelica Berg  Updates made by Angelica Medicus, RN since 05/04/2022 12:00 AM     Problem: Knowledge Deficit and Care Coordination Needs Related to Management of  DM, obesity and HLD  Priority: High     Long-Range Goal: Development of Plan Of Care to Address Care Coordination Needs and Knowledge Deficits for Management of DM, obesity, HLD   Start Date: 04/22/2021  Expected End Date: 06/29/2022  Priority: High  Note:   Current Barriers:  Knowledge Deficits related to plan of care for management of HLD, DMII, HTN and Obesity  05/04/22:  Patient states she is taking all medications.  Out of test strips for 3 weeks-to follow up today.  Patient states her toe is feeling better.   RNCM Clinical Goal(s):  Patient will verbalize basic understanding of HLD, DMII, and  obesity disease process and self health management plan as evidenced by improved Hgb A1C, weight loss or no weight gain, normal lipid panel take all medications exactly as prescribed and will call provider for medication related questions as evidenced by patient reporting improvement in taken medications as prescribed    demonstrate understanding of rationale for each prescribed medication as evidenced by patient able to report to provider purpose of medication    attend all scheduled medical appointments: with primary care provider     demonstrate improved adherence to prescribed treatment plan for HLD, DMII, and Obesity as evidenced by improved Hgb A1C, weight loss of no weight gain, improvement in lipid panel continue to work with Consulting civil engineer and/or Social Worker to address care management and care coordination needs related to HLD, DMII, and Obesity as evidenced by adherence to CM Team Scheduled appointments  through collaboration with RN Care manager, provider, and care team.  Work with pharmacist to address inconsistent medication taking behavior and personal feeling regarding medications. i.e. "I don't like taking so many medications" and appropriateness of adding Tirzepatide to medication regimen in place of Trulicity  Interventions: Inter-disciplinary care team collaboration (see longitudinal plan of care) Evaluation of current treatment plan related to  self management and patient's adherence to plan as established by provider Care guide referral for dental resources-completed. Collaborated with Care Guide for resources. Encouraged patient to take all medications BSW follow up appointment scheduled.  Interdisciplinary Collaboration Interventions:  (Status: Goal on track:  NO.) Long Term Goal  - Collaborated with BSW to initiate plan of care to address needs related to Financial constraints related to limited income, Limited access to food, and Lacks knowledge of community resource: to  address food and financial insecurity and dental resources  in patient with HLD, DMII, and Obesity Collaboration with [name], [name], MD regarding development and update of comprehensive plan of care as evidenced by provider attestation and co-signature Inter-disciplinary care team collaboration   Referral placed with the pharmacist to address medication taking behavior and patient's personal feelings of "I don't like taking so many medications"  Schedule initial telephone appointment with Managed Medicaid BSW [name] to address financial insecurities and patient's inability to pay rent Referral sent to community care guide requesting resources provided in January related to financial insecurity and utilities be resent to patient  07/28/21: BSW completed telephone outreach with patient regarding rent and financial insecurity. BSW explained what happened with her rent and stated she is going back to court on 08/06/21. Patient stated that she does need assistance with her back rent and utilities. Patient states her rent is about 8,000 and utilities are about 1500. BSW provided patient with the phone number for legal aid and encouraged patient to try the resources provided by the care guide.  08/01/21: BSW returned patients telephone call. She stated she spoke with CG  and the email was not coming through. Patient stated she spoke with DSS and are case was closed. BSW informed patient she should reapply. Bsw will send a message to leadership about patient getting assistance.   Diabetes:  (Status: Goal on track: NO.) Long Term Goal - patient states she is taking Trulicity as prescribed and remains interested in [facility] to the [facility]  Lab Results  Component Value Date   HGBA1C 8.4 (A) 02/25/2021       HGBA1C                                                      12.6                                   02/25/2022 Provided education to patient about basic DM disease  process; Reviewed medications with patient and discussed importance of medication adherence;        Reviewed prescribed diet with patient CHO controlled; Discussed plans with patient for ongoing care management follow up and provided patient with direct contact information for care management team;            Hyperlipidemia:  (Status: Condition stable. Not addressed this visit.) Long Term Goal  Lab Results  Component Value Date   CHOL 113 08/21/2020   HDL 44.70 08/21/2020   LDLCALC 52 08/21/2020   TRIG 80.0 08/21/2020   CHOLHDL 3 08/21/2020    Medication review performed; medication list updated in electronic medical record.   Weight Loss:  (Status: Goal on track: NO.) Long term Goal-  [facility] would [facility] Reviewed DM metabolic deficits including decreased satiety signaling to brain from intestines and newer medication Tirzepatide used to treat DM/ lipid/BP . Encouraged patient to discuss referral to [facility] to assist her with weight management and glycemic control when she sees her primary care provider  Patient Goals/Self-Care Activities: Take medications as prescribed   Attend all scheduled provider appointments Call pharmacy for medication refills 3-7 days in advance of running out of medications Perform all self care activities independently  Perform IADL's (shopping, preparing meals, housekeeping, managing finances) independently Call provider office for new concerns or questions  set goal weight fill half of plate with vegetables manage portion size switch to sugar-free drinks - call for medicine refill 2 or 3 days before it runs out - take all medications exactly as prescribed - call doctor with any symptoms you believe are related to your medicine - call doctor when you experience any new symptoms - go to all doctor appointments as scheduled - adhere to prescribed diet: CHO controlled -  discuss [facility] to [facility] with primary care provider Patient to follow up on test strips

## 2022-05-04 NOTE — Patient Outreach (Signed)
Medicaid Managed Care   Nurse Care Manager Note  05/04/2022 Name:  Angelica Berg MRN:  [id number] DOB:  07-21-1975  Angelica Berg is an 47 y.o. year old female who is a primary patient of Philip Aspen, Limmie Patricia, MD.  The [facility] Managed Care Coordination team was consulted for assistance with:    Chronic healthcare management needs, DM, HTN, HLD  Angelica Berg was given information about Medicaid Managed Care Coordination team services today. Angelica Berg Patient agreed to services and verbal consent obtained.  Engaged with patient by telephone for follow up visit in response to provider referral for case management and/or care coordination services.   Assessments/Interventions:  Review of past medical history, allergies, medications, health status, including review of consultants reports, laboratory and other test data, was performed as part of comprehensive evaluation and provision of chronic care management services.  SDOH (Social Determinants of Health) assessments and interventions performed: SDOH Interventions    Flowsheet Row Patient Outreach Telephone from 05/04/2022 in [place] [facility] DEPARTMENT Patient Outreach Telephone from 03/30/2022 in [place] POP[facility] DEPARTMENT Patient Outreach Telephone from 02/03/2022 in [facility] Community Care Coordination Office Visit from 11/25/2021 in [facility] Patient Outreach Telephone from 11/12/2021 in [facility] Care Coordination Patient Outreach Telephone from 08/18/2021 in [facility] Care Coordination  SDOH Interventions        Housing Interventions Intervention Not Indicated -- -- -- Other (Comment)  [refer to SW] --  Utilities Interventions -- -- Intervention Not Indicated -- -- --  Alcohol Usage Interventions -- -- Intervention Not Indicated (Score <7) -- -- --  Depression Interventions/Treatment  -- -- -- Currently on Treatment  -- --  Physical Activity Interventions -- Intervention Not Indicated -- -- -- --  Stress Interventions -- Patient Refused  [declines SW services] -- -- -- Other (Comment)  [patient is speaking with MMSW]  Social Connections Interventions Intervention Not Indicated, Other (Comment)  [patient works 7 days a week right now] -- -- -- -- --     Care Plan  No Known Allergies  Medications Reviewed Today     Reviewed by [name], RN (Registered Nurse) on 05/04/22 at [phone]  Med List Status: <None>   Medication Order Taking? Sig Documenting Provider Last Dose Status Informant  amLODipine (NORVASC) 5 MG tablet [id number]  Take 1 tablet (5 mg total) by mouth daily. Philip Aspen, Limmie Patricia, MD  Active   amoxicillin (AMOXIL) 500 MG tablet [id number] No Take 1 tablet (500 mg total) by mouth 3 (three) times daily. [name], MD Taking Active   atorvastatin (LIPITOR) 80 MG tablet [id number] No TAKE ONE TABLET BY MOUTH DAILY Philip Aspen, Limmie Patricia, MD Taking Active   Blood Glucose Monitoring Suppl (ACCU-CHEK GUIDE) w/Device KIT [id number] No 1 Device by Does not apply route daily. E11.9 [name], Konrad Dolores, MD Taking Active            Med Note Clearance Coots, Mel Almond Aug 04, 2021 11:03 AM)    Blood Glucose Monitoring Suppl [facility] VERIO) w/Device KIT [id number] No 1 Device by Does not apply route as directed. [name], Konrad Dolores, MD Taking Active   estradiol (ESTRACE) 0.1 MG/GM vaginal cream [id number] No Insert one gram into vagina twice weekly. [name], MD Taking Active   glucose blood (ACCU-CHEK GUIDE) test strip [id number] No Use as instructed to test blood sugar 2 times daily E11.9 [name], Konrad Dolores, MD Taking Active  ibuprofen (ADVIL) 800 MG tablet [id number] No Take 1 tablet (800 mg total) by mouth every 8 (eight) hours as needed. [name], MD Taking Active   lisinopril (ZESTRIL) 5 MG tablet [id number] No TAKE ONE TABLET BY MOUTH DAILY   Patient not taking: Reported on 03/30/2022   [name], [name], MD Not Taking Active   metFORMIN (GLUCOPHAGE) 1000 MG tablet [id number] No TAKE ONE TABLET BY MOUTH AT BEDTIME  Patient not taking: Reported on 03/30/2022   [name], [name], MD Not Taking Active   nystatin ointment (MYCOSTATIN) [id number] No Apply 1 Application topically 2 (two) times daily. Princess Bruins, MD Taking Active   triamcinolone ointment (KENALOG) 0.1 % [id number] No Apply 1 Application topically 2 (two) times daily. Princess Bruins, MD Taking Active   TRULICITY 1.5 LP/3.7TK Bon[place] Aid [id number] No INJECT 1.5 MG UNDER THE SKIN ONCE WEEKLY [name], [name], MD Taking Active            Patient Active Problem List   Diagnosis Date Noted   Hypertension associated with diabetes ([place]) 02/25/2022   Pain in left foot 12/31/2021   Pain in left knee 05/23/2019   Morbid obesity ([place]) 08/30/2018   Vitamin D deficiency 08/30/2018   Hyperlipidemia associated with type 2 diabetes mellitus ([place]) 08/30/2018   Transaminitis 08/30/2018   Conditions to be addressed/monitored per PCP order:  Chronic healthcare management needs, DM, HTN, HLD  Care Plan : RN Care Manager Plan Of Care  Updates made by [name], RN since 05/04/2022 12:00 AM     Problem: Knowledge Deficit and Care Coordination Needs Related to Management of  DM, obesity and HLD   Priority: High     Long-Range Goal: Development of Plan Of Care to Address Care Coordination Needs and Knowledge Deficits for Management of DM, obesity, HLD   Start Date: 04/22/2021  Expected End Date: 06/29/2022  Priority: High  Note:   Current Barriers:  Knowledge Deficits related to plan of care for management of HLD, DMII, HTN and Obesity  05/04/22:  Patient states she is taking all medications.  Out of test strips for 3 weeks-to follow up today.  Patient states her toe is feeling better.   RNCM Clinical Goal(s):  Patient will verbalize basic  understanding of HLD, DMII, and obesity disease process and self health management plan as evidenced by improved Hgb A1C, weight loss or no weight gain, normal lipid panel take all medications exactly as prescribed and will call provider for medication related questions as evidenced by patient reporting improvement in taken medications as prescribed    demonstrate understanding of rationale for each prescribed medication as evidenced by patient able to report to provider purpose of medication    attend all scheduled medical appointments: with primary care provider     demonstrate improved adherence to prescribed treatment plan for HLD, DMII, and Obesity as evidenced by improved Hgb A1C, weight loss of no weight gain, improvement in lipid panel continue to work with Consulting civil engineer and/or Social Worker to address care management and care coordination needs related to HLD, DMII, and Obesity as evidenced by adherence to CM Team Scheduled appointments  through collaboration with RN Care manager, provider, and care team.  Work with pharmacist to address inconsistent medication taking behavior and personal feeling regarding medications. i.e. "I don't like taking so many medications" and appropriateness of adding Tirzepatide to medication regimen in place of Trulicity  Interventions: Inter-disciplinary care team collaboration (see longitudinal plan of care)  Evaluation of current treatment plan related to  self management and patient's adherence to plan as established by provider Care guide referral for dental resources-completed. Collaborated with Care Guide for resources. Encouraged patient to take all medications BSW follow up appointment scheduled.  Interdisciplinary Collaboration Interventions:  (Status: Goal on track:  NO.) Long Term Goal  - Collaborated with BSW to initiate plan of care to address needs related to Financial constraints related to limited income, Limited access to food, and Lacks  knowledge of community resource: to address food and financial insecurity and dental resources  in patient with HLD, DMII, and Obesity Collaboration with [name], [name], MD regarding development and update of comprehensive plan of care as evidenced by provider attestation and co-signature Inter-disciplinary care team collaboration   Referral placed with the pharmacist to address medication taking behavior and patient's personal feelings of "I don't like taking so many medications"  Schedule initial telephone appointment with Managed Medicaid BSW [name] to address financial insecurities and patient's inability to pay rent Referral sent to community care guide requesting resources provided in January related to financial insecurity and utilities be resent to patient  07/28/21: BSW completed telephone outreach with patient regarding rent and financial insecurity. BSW explained what happened with her rent and stated she is going back to court on 08/06/21. Patient stated that she does need assistance with her back rent and utilities. Patient states her rent is about 8,000 and utilities are about 1500. BSW provided patient with the phone number for legal aid and encouraged patient to try the resources provided by the care guide.  08/01/21: BSW returned patients telephone call. She stated she spoke with CG and the email was not coming through. Patient stated she spoke with DSS and are case was closed. BSW informed patient she should reapply. Bsw will send a message to leadership about patient getting assistance.   Diabetes:  (Status: Goal on track: NO.) Long Term Goal - patient states she is taking Trulicity as prescribed and remains interested in referral to the [facility] [place]  Lab Results  Component Value Date   HGBA1C 8.4 (A) 02/25/2021       HGBA1C                                                      12.6                                   02/25/2022 Provided education  to patient about basic DM disease process; Reviewed medications with patient and discussed importance of medication adherence;        Reviewed prescribed diet with patient CHO controlled; Discussed plans with patient for ongoing care management follow up and provided patient with direct contact information for care management team;            Hyperlipidemia:  (Status: Condition stable. Not addressed this visit.) Long Term Goal  Lab Results  Component Value Date   CHOL 113 08/21/2020   HDL 44.70 08/21/2020   LDLCALC 52 08/21/2020   TRIG 80.0 08/21/2020   CHOLHDL 3 08/21/2020    Medication review performed; medication list updated in electronic medical record.   Weight Loss:  (Status: Goal on track: NO.) Long term  Goal-  Patient would [facility] to [facility] Reviewed DM metabolic deficits including decreased satiety signaling to brain from intestines and newer medication Tirzepatide used to treat DM/ lipid/BP . Encouraged patient to discuss referral to [place] to assist her with weight management and glycemic control when she sees her primary care provider  Patient Goals/Self-Care Activities: Take medications as prescribed   Attend all scheduled provider appointments Call pharmacy for medication refills 3-7 days in advance of running out of medications Perform all self care activities independently  Perform IADL's (shopping, preparing meals, housekeeping, managing finances) independently Call provider office for new concerns or questions  set goal weight fill half of plate with vegetables manage portion size switch to sugar-free drinks - call for medicine refill 2 or 3 days before it runs out - take all medications exactly as prescribed - call doctor with any symptoms you believe are related to your medicine - call doctor when you experience any new symptoms - go to all doctor appointments as scheduled - adhere to  prescribed diet: CHO controlled - discuss referral to [facility] with primary care provider Patient to follow up on test strips    Long-Range Goal: Establish Plan of Care for Chronic Disease Management Needs   Priority: High  Note:   Timeframe:  Long-Range Goal Priority:  High Start Date:     08/18/21                        Expected End Date: ongoing                      Follow Up Date: 05/29/22  - practice safe sex - schedule appointment for flu shot - schedule appointment for vaccines needed due to my age or health - schedule recommended health tests (blood work, mammogram, colonoscopy, pap test) - schedule and keep appointment for annual check-up    Why is this important?   Screening tests can find diseases early when they are easier to treat.  Your doctor or nurse will talk with you about which tests are important for you.  Getting shots for common diseases like the flu and shingles will help prevent them.    05/04/22:  Patient has follow up appt with PCP 05/27/22   Follow Up:  Patient agrees to Care Plan and Follow-up.  Plan: The Managed Medicaid care management team will reach out to the patient again over the next 30 business  days. and The  Patient has been provided with contact information for the Managed Medicaid care management team and has been advised to call with any health related questions or concerns.  Date/time of next scheduled RN care management/care coordination outreach:  05/29/22 at 230.

## 2022-05-05 ENCOUNTER — Other Ambulatory Visit: Payer: Medicaid Other

## 2022-05-05 NOTE — Patient Instructions (Signed)
Visit Information  Ms. Stahlecker was given information about Medicaid Managed Care team care coordination services as a part of their [place] Medicaid benefit. [name] verbally consented to engagement with the [facility] Managed Care team.   If you are experiencing a medical emergency, please call 911 or report to your local emergency department or urgent care.   If you have a non-emergency medical problem during routine business hours, please contact your provider's office and ask to speak with a nurse.   For questions related to your [facility], please call: [phone] or visit the homepage here: [email or web address]  If you would like to schedule transportation through your [facility], please call the following number at least 2 days in advance of your appointment: [phone]   Rides for urgent appointments can also be made after hours by calling Member Services.  Call the [facility] at [phone], at any time, 24 hours a day, 7 days a week. If you are in danger or need immediate medical attention call 911.  If you would like help to quit smoking, call 1-800-QUIT-NOW 302-744-6709) OR Espaol: 1-855-Djelo-Ya (5-465-035-4656) o para ms informacin haga clic aqu or Text READY to 200-400 to register via text  Angelica Berg - following are the goals we discussed in your visit today:   Goals Addressed   None     Social Worker will follow up on 05/12/22.   [name], BSW, [place]  High Risk Managed Medicaid Team  [phone]   Following is a copy of your plan of care:  Care Plan : [place]  Updates made by Ethelda Chick since 05/05/2022 12:00 AM     Problem: Knowledge Deficit and Care Coordination Needs Related to Management of  DM, obesity and HLD   Priority:  High     Long-Range Goal: Development of Plan Of Care to Address Care Coordination Needs and Knowledge Deficits for Management of DM, obesity, HLD   Start Date: 04/22/2021  Expected End Date: 06/29/2022  Priority: High  Note:   Current Barriers:  Knowledge Deficits related to plan of care for management of HLD, DMII, HTN and Obesity  05/04/22:  Patient states she is taking all medications.  Out of test strips for 3 weeks-to follow up today.  Patient states her toe is feeling better.   RNCM Clinical Goal(s):  Patient will verbalize basic understanding of HLD, DMII, and obesity disease process and self health management plan as evidenced by improved Hgb A1C, weight loss or no weight gain, normal lipid panel take all medications exactly as prescribed and will call provider for medication related questions as evidenced by patient reporting improvement in taken medications as prescribed    demonstrate understanding of rationale for each prescribed medication as evidenced by patient able to report to provider purpose of medication    attend all scheduled medical appointments: with primary care provider     demonstrate improved adherence to prescribed treatment plan for HLD, DMII, and Obesity as evidenced by improved Hgb A1C, weight loss of no weight gain, improvement in lipid panel continue to work with Consulting civil engineer and/or Social Worker to address care management and care coordination needs related to HLD, DMII, and Obesity as evidenced by adherence to CM Team Scheduled appointments  through collaboration with RN Care manager, provider, and care team.  Work with pharmacist to address inconsistent medication  taking behavior and personal feeling regarding medications. i.e. "I don't like taking so many medications" and appropriateness of adding Tirzepatide to medication regimen in place of Trulicity  Interventions: Inter-disciplinary care team collaboration (see longitudinal plan of care) Evaluation of  current treatment plan related to  self management and patient's adherence to plan as established by provider Care guide referral for dental resources-completed. Collaborated with Care Guide for resources. Encouraged patient to take all medications BSW follow up appointment scheduled. BSW completed a telephone outreach with patient. She states that her lights did get turned off but she was able to gather the money to get them turned back on. She does have a balance of about 500 and has completed an application with DSS for them to pay it. Patient is now waiting on a telephone call back from them. No other resources are needed at this time.  Interdisciplinary Collaboration Interventions:  (Status: Goal on track:  NO.) Long Term Goal  - Collaborated with BSW to initiate plan of care to address needs related to Financial constraints related to limited income, Limited access to food, and Lacks knowledge of community resource: to address food and financial insecurity and dental resources  in patient with HLD, DMII, and Obesity Collaboration with Philip Aspen, Limmie Patricia, MD regarding development and update of comprehensive plan of care as evidenced by provider attestation and co-signature Inter-disciplinary care team collaboration   Referral placed with the pharmacist to address medication taking behavior and patient's personal feelings of "I don't like taking so many medications"  Schedule initial telephone appointment with Managed Medicaid BSW [name] to address financial insecurities and patient's inability to pay rent Referral sent to community care guide requesting resources provided in January related to financial insecurity and utilities be resent to patient  07/28/21: BSW completed telephone outreach with patient regarding rent and financial insecurity. BSW explained what happened with her rent and stated she is going back to court on 08/06/21. Patient stated that she does need assistance  with her back rent and utilities. Patient states her rent is about 8,000 and utilities are about 1500. BSW provided patient with the phone number for legal aid and encouraged patient to try the resources provided by the care guide.  08/01/21: BSW returned patients telephone call. She stated she spoke with CG and the email was not coming through. Patient stated she spoke with DSS and are case was closed. BSW informed patient she should reapply. Bsw will send a message to leadership about patient getting assistance.   Diabetes:  (Status: Goal on track: NO.) Long Term Goal - patient states she is taking Trulicity as prescribed and remains interested in referral to the [facility]  Lab Results  Component Value Date   HGBA1C 8.4 (A) 02/25/2021       HGBA1C                                                      12.6                                   02/25/2022 Provided education to patient about basic DM disease process; Reviewed medications with patient and discussed importance of medication adherence;        Reviewed  prescribed diet with patient CHO controlled; Discussed plans with patient for ongoing care management follow up and provided patient with direct contact information for care management team;            Hyperlipidemia:  (Status: Condition stable. Not addressed this visit.) Long Term Goal  Lab Results  Component Value Date   CHOL 113 08/21/2020   HDL 44.70 08/21/2020   LDLCALC 52 08/21/2020   TRIG 80.0 08/21/2020   CHOLHDL 3 08/21/2020    Medication review performed; medication list updated in electronic medical record.   Weight Loss:  (Status: Goal on track: NO.) Long term Goal-  Patient would like referral to [facility] [place] DM metabolic deficits including decreased satiety signaling to brain from intestines and newer medication Tirzepatide used to treat DM/ lipid/BP . Encouraged patient to discuss referral to [facility] [place] to assist her with weight management and glycemic control when she sees her primary care provider  Patient Goals/Self-Care Activities: Take medications as prescribed   Attend all scheduled provider appointments Call pharmacy for medication refills 3-7 days in advance of running out of medications Perform all self care activities independently  Perform IADL's (shopping, preparing meals, housekeeping, managing finances) independently Call provider office for new concerns or questions  set goal weight fill half of plate with vegetables manage portion size switch to sugar-free drinks - call for medicine refill 2 or 3 days before it runs out - take all medications exactly as prescribed - call doctor with any symptoms you believe are related to your medicine - call doctor when you experience any new symptoms - go to all doctor appointments as scheduled - adhere to prescribed diet: CHO controlled - discuss referral to [facility] [place] with primary care provider Patient to follow up on test strips

## 2022-05-05 NOTE — Patient Outreach (Signed)
Medicaid Managed Care Social Work Note  05/05/2022 Name:  Angelica Berg MRN:  [id number] DOB:  Nov 14, 1975  Angelica Berg is an 47 y.o. year old female who is a primary patient of Philip Aspen, Limmie Patricia, MD.  The Medicaid Managed Care Coordination team was consulted for assistance with:  Community Resources   Ms. Wachob was given information about Medicaid Managed Care Coordination team services today. Angelica Berg Patient agreed to services and verbal consent obtained.  Engaged with patient  for by telephone forfollow up visit in response to referral for case management and/or care coordination services.   Assessments/Interventions:  Review of past medical history, allergies, medications, health status, including review of consultants reports, laboratory and other test data, was performed as part of comprehensive evaluation and provision of chronic care management services.  SDOH: (Social Determinant of Health) assessments and interventions performed: SDOH Interventions    Flowsheet Row Patient Outreach Telephone from 05/04/2022 in [place] [facility]PART[facility] Patient Outreach Telephone from 03/30/2022 in [place] POP[facility]ATION [facility]ALTH DEPARTMENT Patient Outreach Telephone from 02/03/2022 in [facility] Community Care Coordination Office Visit from 11/25/2021 in [facility] Patient Outreach Telephone from 11/12/2021 in [facility] Care Coordination Patient Outreach Telephone from 08/18/2021 in [facility] Care Coordination  SDOH Interventions        Housing Interventions Intervention Not Indicated -- -- -- Other (Comment)  [refer to SW] --  Utilities Interventions -- -- Intervention Not Indicated -- -- --  Alcohol Usage Interventions -- -- Intervention Not Indicated (Score <7) -- -- --  Depression Interventions/Treatment  -- -- -- Currently on Treatment -- --  Physical Activity Interventions --  Intervention Not Indicated -- -- -- --  Stress Interventions -- Patient Refused  [declines SW services] -- -- -- Other (Comment)  [patient is speaking with MMSW]  Social Connections Interventions Intervention Not Indicated, Other (Comment)  [patient works 7 days a week right now] -- -- -- -- --     BSW completed a telephone outreach with patient. She states that her lights did get turned off but she was able to gather the money to get them turned back on. She does have a balance of about 500 and has completed an application with DSS for them to pay it. Patient is now waiting on a telephone call back from them. No other resources are needed at this time.  Advanced Directives Status:  Not addressed in this encounter.  Care Plan                 No Known Allergies  Medications Reviewed Today     Reviewed by [name], RN (Registered Nurse) on 05/04/22 at [phone]  Med List Status: <None>   Medication Order Taking? Sig Documenting Provider Last Dose Status Informant  amLODipine (NORVASC) 5 MG tablet [id number]  Take 1 tablet (5 mg total) by mouth daily. Philip Aspen, Limmie Patricia, MD  Active   amoxicillin (AMOXIL) 500 MG tablet [id number] No Take 1 tablet (500 mg total) by mouth 3 (three) times daily. [name], MD Taking Active   atorvastatin (LIPITOR) 80 MG tablet [id number] No TAKE ONE TABLET BY MOUTH DAILY Philip Aspen, Limmie Patricia, MD Taking Active   Blood Glucose Monitoring Suppl (ACCU-CHEK GUIDE) w/Device KIT [id number] No 1 Device by Does not apply route daily. E11.9 [name], Konrad Dolores, MD Taking Active            Med Note (HARPER, CATHERINE  T   Mon Aug 04, 2021 11:03 AM)    Blood Glucose Monitoring Suppl [facility] VERIO) w/Device KIT [id number] No 1 Device by Does not apply route as directed. [name], Konrad Dolores, MD Taking Active   estradiol (ESTRACE) 0.1 MG/GM vaginal cream [id number] No Insert one gram into vagina twice weekly. [name], MD Taking Active    glucose blood (ACCU-CHEK GUIDE) test strip [id number] No Use as instructed to test blood sugar 2 times daily E11.9 [name], Konrad Dolores, MD Taking Active   ibuprofen (ADVIL) 800 MG tablet [id number] No Take 1 tablet (800 mg total) by mouth every 8 (eight) hours as needed. Rodolph Bong, MD Taking Active   lisinopril (ZESTRIL) 5 MG tablet [id number] No TAKE ONE TABLET BY MOUTH DAILY  Patient not taking: Reported on 03/30/2022   Philip Aspen, Limmie Patricia, MD Not Taking Active   metFORMIN (GLUCOPHAGE) 1000 MG tablet [id number] No TAKE ONE TABLET BY MOUTH AT BEDTIME  Patient not taking: Reported on 03/30/2022   Philip Aspen, Limmie Patricia, MD Not Taking Active   nystatin ointment (MYCOSTATIN) [id number] No Apply 1 Application topically 2 (two) times daily. [name], MD Taking Active   triamcinolone ointment (KENALOG) 0.1 % [id number] No Apply 1 Application topically 2 (two) times daily. [name], MD Taking Active   TRULICITY 1.5 MG/0.5ML Namon Cirri [id number] No INJECT 1.5 MG UNDER THE SKIN ONCE WEEKLY Philip Aspen, Limmie Patricia, MD Taking Active             Patient Active Problem List   Diagnosis Date Noted   Hypertension associated with diabetes (HCC) 02/25/2022   Pain in left foot 12/31/2021   Pain in left knee 05/23/2019   Morbid obesity (HCC) 08/30/2018   Vitamin D deficiency 08/30/2018   Hyperlipidemia associated with type 2 diabetes mellitus (HCC) 08/30/2018   Transaminitis 08/30/2018    Conditions to be addressed/monitored per PCP order:   community resources  Care Plan : RN Care Manager Plan Of Care  Updates made by [name] since 05/05/2022 12:00 AM     Problem: Knowledge Deficit and Care Coordination Needs Related to Management of  DM, obesity and HLD   Priority: High     Long-Range Goal: Development of Plan Of Care to Address Care Coordination Needs and Knowledge Deficits for Management of DM, obesity, HLD   Start Date: 04/22/2021  Expected End  Date: 06/29/2022  Priority: High  Note:   Current Barriers:  Knowledge Deficits related to plan of care for management of HLD, DMII, HTN and Obesity  05/04/22:  Patient states she is taking all medications.  Out of test strips for 3 weeks-to follow up today.  Patient states her toe is feeling better.   RNCM Clinical Goal(s):  Patient will verbalize basic understanding of HLD, DMII, and obesity disease process and self health management plan as evidenced by improved Hgb A1C, weight loss or no weight gain, normal lipid panel take all medications exactly as prescribed and will call provider for medication related questions as evidenced by patient reporting improvement in taken medications as prescribed    demonstrate understanding of rationale for each prescribed medication as evidenced by patient able to report to provider purpose of medication    attend all scheduled medical appointments: with primary care provider     demonstrate improved adherence to prescribed treatment plan for HLD, DMII, and Obesity as evidenced by improved Hgb A1C, weight loss of no weight gain, improvement in lipid panel continue to work with [employer]  Care Manager and/or Social Worker to address care management and care coordination needs related to HLD, DMII, and Obesity as evidenced by adherence to CM Team Scheduled appointments  through collaboration with RN Care manager, provider, and care team.  Work with pharmacist to address inconsistent medication taking behavior and personal feeling regarding medications. i.e. "I don't like taking so many medications" and appropriateness of adding Tirzepatide to medication regimen in place of Trulicity  Interventions: Inter-disciplinary care team collaboration (see longitudinal plan of care) Evaluation of current treatment plan related to  self management and patient's adherence to plan as established by provider Care guide referral for dental resources-completed. Collaborated with Care  Guide for resources. Encouraged patient to take all medications BSW follow up appointment scheduled. BSW completed a telephone outreach with patient. She states that her lights did get turned off but she was able to gather the money to get them turned back on. She does have a balance of about 500 and has completed an application with DSS for them to pay it. Patient is now waiting on a telephone call back from them. No other resources are needed at this time.  Interdisciplinary Collaboration Interventions:  (Status: Goal on track:  NO.) Long Term Goal  - Collaborated with BSW to initiate plan of care to address needs related to Financial constraints related to limited income, Limited access to food, and Lacks knowledge of community resource: to address food and financial insecurity and dental resources  in patient with HLD, DMII, and Obesity Collaboration with [name], [name], MD regarding development and update of comprehensive plan of care as evidenced by provider attestation and co-signature Inter-disciplinary care team collaboration   Referral placed with the pharmacist to address medication taking behavior and patient's personal feelings of "I don't like taking so many medications"  Schedule initial telephone appointment with Managed Medicaid BSW [name] to address financial insecurities and patient's inability to pay rent Referral sent to community care guide requesting resources provided in January related to financial insecurity and utilities be resent to patient  07/28/21: BSW completed telephone outreach with patient regarding rent and financial insecurity. BSW explained what happened with her rent and stated she is going back to court on 08/06/21. Patient stated that she does need assistance with her back rent and utilities. Patient states her rent is about 8,000 and utilities are about 1500. BSW provided patient with the phone number for legal aid and encouraged patient to try  the resources provided by the care guide.  08/01/21: BSW returned patients telephone call. She stated she spoke with [facility] and the email was not coming through. Patient stated she spoke with DSS and are case was closed. BSW informed patient she should reapply. Bsw will send a message to leadership about patient getting assistance.   Diabetes:  (Status: Goal on track: NO.) Long Term Goal - patient states she is taking Trulicity as prescribed and remains interested in referral to the [facility] [place]  Lab Results  Component Value Date   HGBA1C 8.4 (A) 02/25/2021       HGBA1C                                                      12.6  02/25/2022 Provided education to patient about basic DM disease process; Reviewed medications with patient and discussed importance of medication adherence;        Reviewed prescribed diet with patient CHO controlled; Discussed plans with patient for ongoing care management follow up and provided patient with direct contact information for care management team;            Hyperlipidemia:  (Status: Condition stable. Not addressed this visit.) Long Term Goal  Lab Results  Component Value Date   CHOL 113 08/21/2020   HDL 44.70 08/21/2020   LDLCALC 52 08/21/2020   TRIG 80.0 08/21/2020   CHOLHDL 3 08/21/2020    Medication review performed; medication list updated in electronic medical record.   Weight Loss:  (Status: Goal on track: NO.) Long term Goal-  Patient would like referral to [facility] [place] DM metabolic deficits including decreased satiety signaling to brain from intestines and newer medication Tirzepatide used to treat DM/ lipid/BP . Encouraged patient to discuss referral to [place] [place] to assist her with weight management and glycemic control when she sees her primary care provider  Patient Goals/Self-Care Activities: Take medications as  prescribed   Attend all scheduled provider appointments Call pharmacy for medication refills 3-7 days in advance of running out of medications Perform all self care activities independently  Perform IADL's (shopping, preparing meals, housekeeping, managing finances) independently Call provider office for new concerns or questions  set goal weight fill half of plate with vegetables manage portion size switch to sugar-free drinks - call for medicine refill 2 or 3 days before it runs out - take all medications exactly as prescribed - call doctor with any symptoms you believe are related to your medicine - call doctor when you experience any new symptoms - go to all doctor appointments as scheduled - adhere to prescribed diet: CHO controlled - discuss referral to [facility] [place] with primary care provider Patient to follow up on test strips     Follow up:  Patient agrees to Care Plan and Follow-up.  Plan: The Managed Medicaid care management team will reach out to the patient again over the next 5 days.  Date/time of next scheduled Social Work care management/care coordination outreach:  05/12/22  [name], Arita Miss, [place] Medicaid Team  [phone]

## 2022-05-06 IMAGING — MG DIGITAL SCREENING BILAT W/ CAD
6 series · 6 of 6 positions shown · non-contrast
Comparison: Previous exam(s).

CLINICAL DATA: Screening.

EXAM:
DIGITAL SCREENING BILATERAL MAMMOGRAM WITH CAD
TECHNIQUE: Bilateral screening digital craniocaudal and mediolateral oblique
mammograms were obtained. The images were evaluated with
computer-aided detection.

[R CC (1 of 2)]
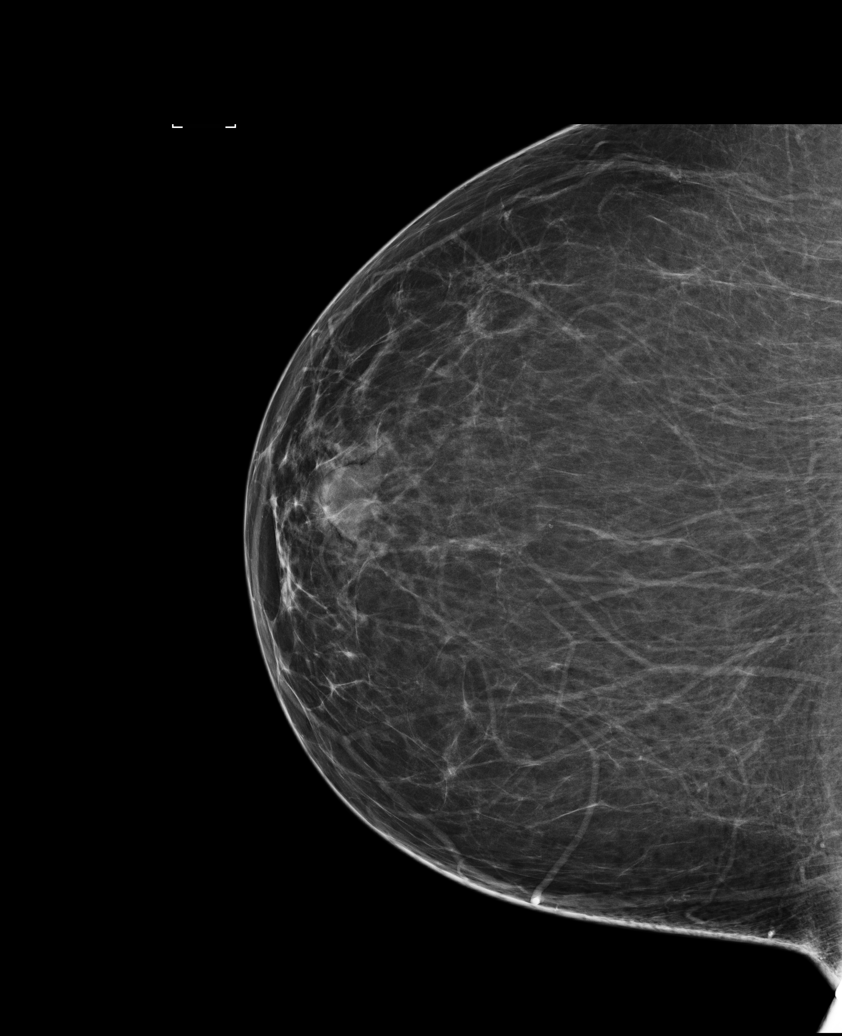

[L MLO]
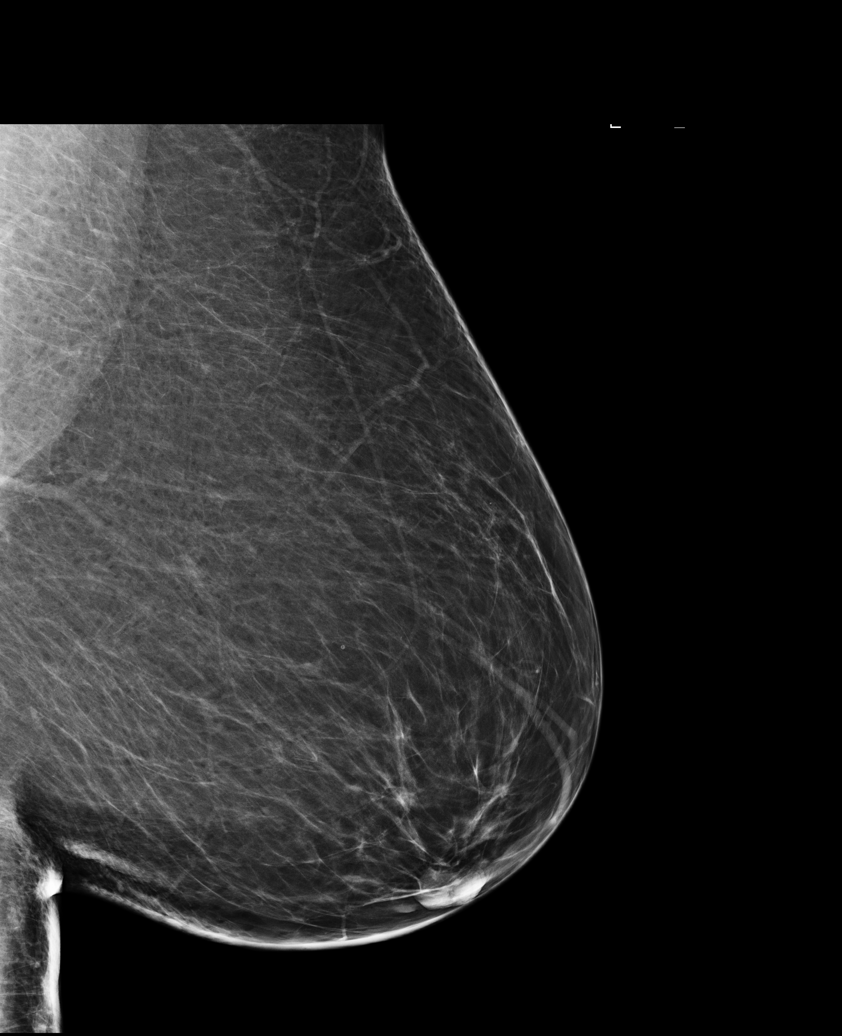

[L CC (1 of 2)]
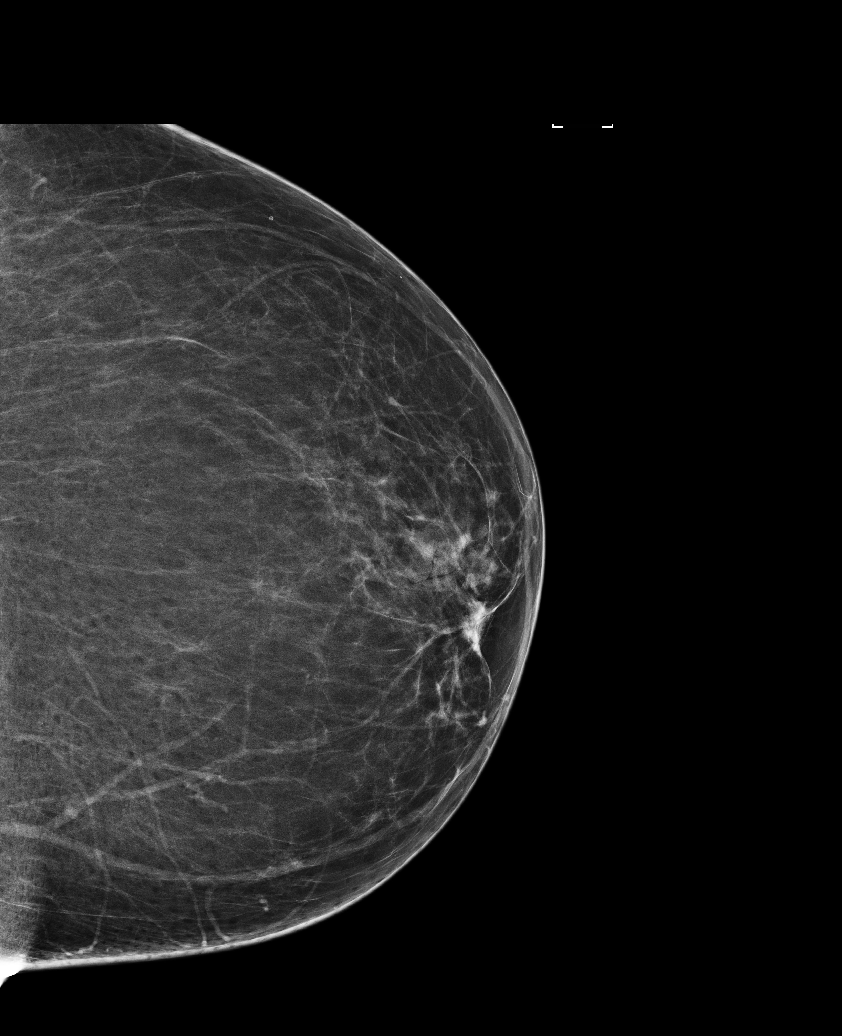

[R CC (2 of 2)]
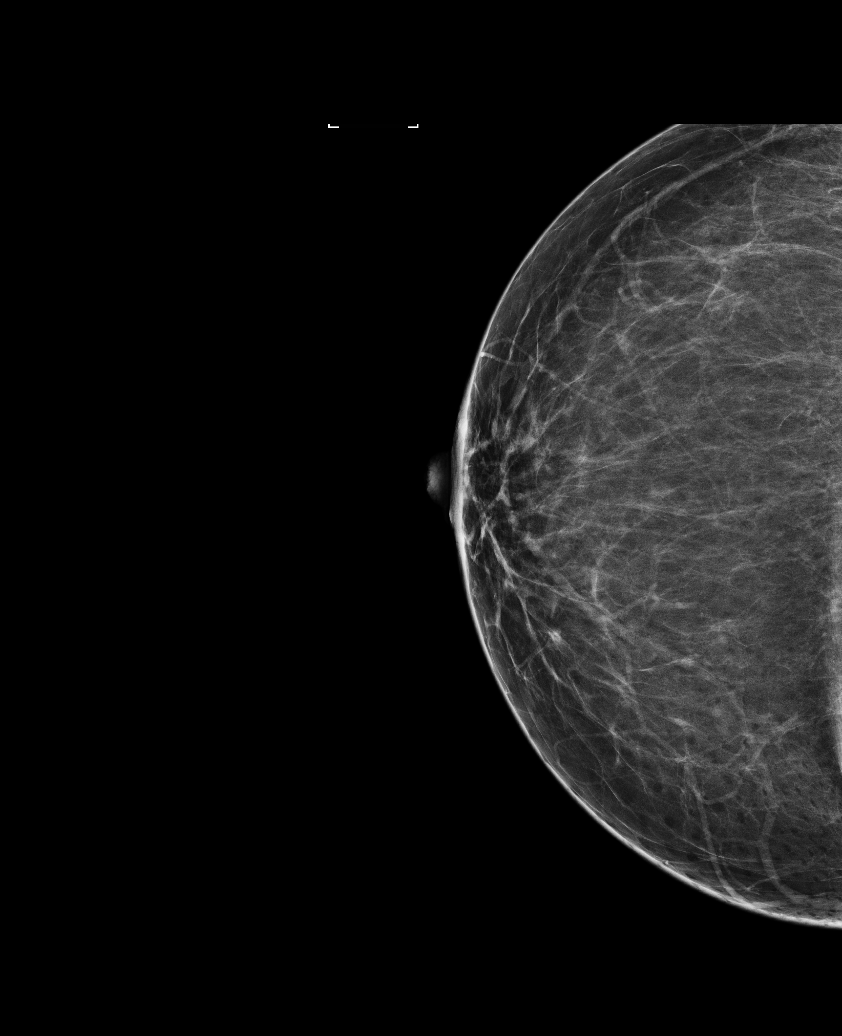

[R MLO]
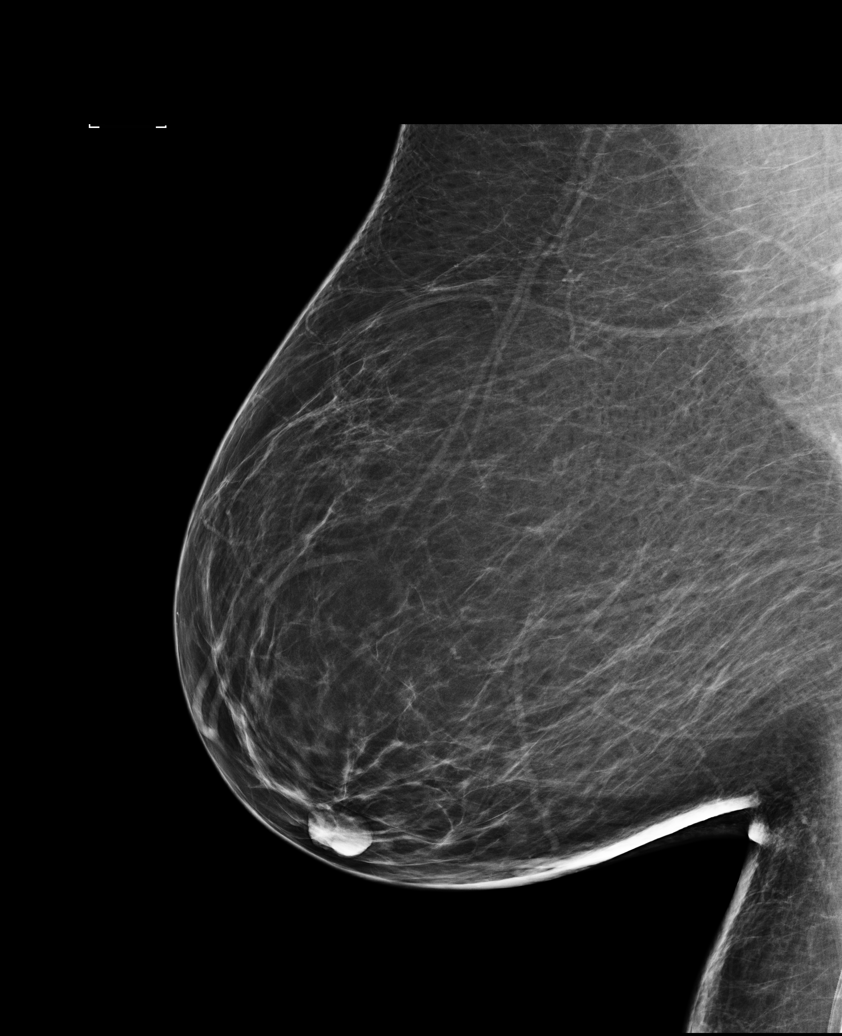

[L CC (2 of 2)]
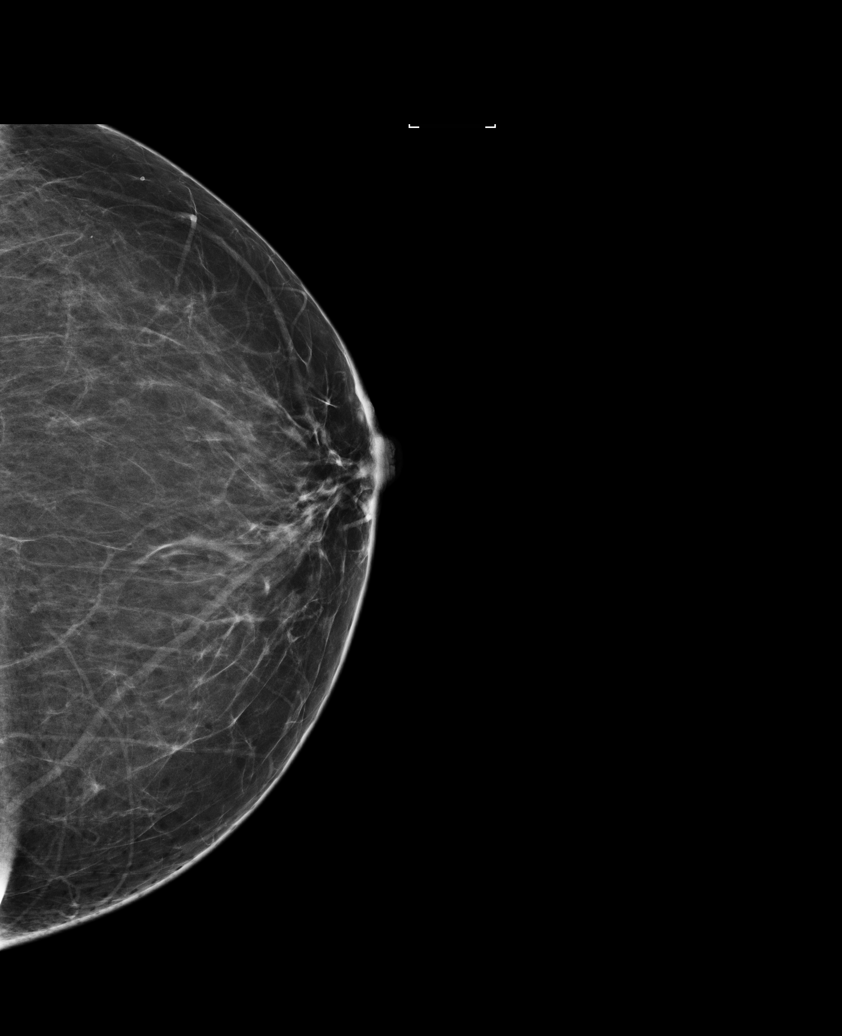

[6 of 6 positions shown; findings below may reference images not displayed]

ACR Breast Density Category b: There are scattered areas of
fibroglandular density.
FINDINGS: There are no findings suspicious for malignancy.
IMPRESSION: No mammographic evidence of malignancy. A result letter of this
screening mammogram will be mailed directly to the patient.

RECOMMENDATION:
Screening mammogram in one year. (Code:[id number])

BI-RADS CATEGORY  1: Negative.

## 2022-05-08 ENCOUNTER — Telehealth: Payer: Self-pay | Admitting: Internal Medicine

## 2022-05-08 NOTE — Telephone Encounter (Signed)
Pt last seen in nov 2023 and would like a refill on glucose blood (ACCU-CHEK GUIDE) test strip  [facility] [id number], [address] Phone: [phone]  Fax: [phone]

## 2022-05-11 MED ORDER — ACCU-CHEK GUIDE VI STRP: ORAL_STRIP | 12 refills | Status: AC

## 2022-05-11 NOTE — Telephone Encounter (Signed)
Refill sent.

## 2022-05-12 ENCOUNTER — Other Ambulatory Visit: Payer: Medicaid Other

## 2022-05-12 NOTE — Patient Instructions (Signed)
Visit Information  Ms. Freeburg was given information about Medicaid Managed Care team care coordination services as a part of their [place] Medicaid benefit. [name] verbally consented to engagement with the [facility] Managed Care team.   If you are experiencing a medical emergency, please call 911 or report to your local emergency department or urgent care.   If you have a non-emergency medical problem during routine business hours, please contact your provider's office and ask to speak with a nurse.   For questions related to your [facility], please call: [phone] or visit the homepage here: [email or web address]  If you would like to schedule transportation through your [facility] please call the following number at least 2 days in advance of your appointment: [phone]   Rides for urgent appointments can also be made after hours by calling Member Services.  Call the [place] at [phone], at any time, 24 hours a day, 7 days a week. If you are in danger or need immediate medical attention call 911.  If you would like help to quit smoking, call 1-800-QUIT-NOW 978-006-7005) OR Espaol: 1-855-Djelo-Ya (7-371-062-6948) o para ms informacin haga clic aqu or Text READY to 200-400 to register via text  [name] - following are the goals we discussed in your visit today:   Goals Addressed   None       Social Worker will follow up in 30 days.  06/12/22  Following is a copy of your plan of care:  There are no care plans that you recently modified to display for this patient.

## 2022-05-12 NOTE — Patient Outreach (Signed)
Medicaid Managed Care Social Work Note  05/12/2022 Name:  Angelica Berg MRN:  [id number] DOB:  [birth year]  Angelica Berg is an 47 y.o. year old female who is a primary patient of [name], [name], MD.  The Medicaid Managed Care Coordination team was consulted for assistance with:  Community Resources   [name] was given information about Medicaid Managed Care Coordination team services today. Angelica Berg Patient agreed to services and verbal consent obtained.  Engaged with patient  for by telephone forfollow up visit in response to referral for case management and/or care coordination services.   Assessments/Interventions:  Review of past medical history, allergies, medications, health status, including review of consultants reports, laboratory and other test data, was performed as part of comprehensive evaluation and provision of chronic care management services.  SDOH: (Social Determinant of Health) assessments and interventions performed: SDOH Interventions    Flowsheet Row Patient Outreach Telephone from 05/04/2022 in [place] Patient Outreach Telephone from 03/30/2022 in [place] Patient Outreach Telephone from 02/03/2022 in [place] Coordination Office Visit from 11/25/2021 in [place] [facility] [place] Patient Outreach Telephone from 11/12/2021 in [place] Patient Outreach Telephone from 08/18/2021 in [place] Interventions        Housing Interventions Intervention Not Indicated -- -- -- Other (Comment)  [refer to SW] --  Utilities Interventions -- -- Intervention Not Indicated -- -- --  Alcohol Usage Interventions -- -- Intervention Not Indicated (Score <7) -- -- --  Depression Interventions/Treatment  -- -- -- Currently on Treatment -- --  Physical Activity  Interventions -- Intervention Not Indicated -- -- -- --  Stress Interventions -- Patient Refused  [declines SW services] -- -- -- Other (Comment)  [patient is speaking with MMSW]  Social Connections Interventions Intervention Not Indicated, Other (Comment)  [patient works 7 days a week right now] -- -- -- -- --      BSW completed a telephone outreach with patient and she was able to get her utility bill paid by DSS. Patient states no other resources are needed at this time. Advanced Directives Status:  Not addressed in this encounter.  Care Plan                 No Known Allergies  Medications Reviewed Today     Reviewed by [name], RN (Registered Nurse) on 05/04/22 at [place] List Status: <None>   Medication Order Taking? Sig Documenting Provider Last Dose Status Informant  amLODipine (NORVASC) 5 MG tablet [id number]  Take 1 tablet (5 mg total) by mouth daily. [name], [name], MD  Active   amoxicillin (AMOXIL) 500 MG tablet [id number] No Take 1 tablet (500 mg total) by mouth 3 (three) times daily. Princess Bruins, MD Taking Active   atorvastatin (LIPITOR) 80 MG tablet [id number] No TAKE ONE TABLET BY MOUTH DAILY [name], [name], MD Taking Active   Blood Glucose Monitoring Suppl (ACCU-CHEK GUIDE) w/Device KIT [id number] No 1 Device by Does not apply route daily. E11.9 [name], [name], MD Taking Active            Med Note [name], Toney Reil Aug 04, 2021 11:03 AM)    Blood Glucose Monitoring Suppl [facility] VERIO) w/Device KIT [id number] No 1 Device by Does not apply route as directed. [name], [name], MD Taking Active  estradiol (ESTRACE) 0.1 MG/GM vaginal cream [id number] No Insert one gram into vagina twice weekly. Princess Bruins, MD Taking Active   glucose blood (ACCU-CHEK GUIDE) test strip [id number] No Use as instructed to test blood sugar 2 times daily E11.9 [name], [name], MD Taking Active   ibuprofen (ADVIL)  800 MG tablet [id number] No Take 1 tablet (800 mg total) by mouth every 8 (eight) hours as needed. [name], MD Taking Active   lisinopril (ZESTRIL) 5 MG tablet [id number] No TAKE ONE TABLET BY MOUTH DAILY  Patient not taking: Reported on 03/30/2022   [name], [name], MD Not Taking Active   metFORMIN (GLUCOPHAGE) 1000 MG tablet [id number] No TAKE ONE TABLET BY MOUTH AT BEDTIME  Patient not taking: Reported on 03/30/2022   [name], [name], MD Not Taking Active   nystatin ointment (MYCOSTATIN) [id number] No Apply 1 Application topically 2 (two) times daily. Princess Bruins, MD Taking Active   triamcinolone ointment (KENALOG) 0.1 % [id number] No Apply 1 Application topically 2 (two) times daily. Princess Bruins, MD Taking Active   TRULICITY 1.5 OV/7.8HY [facility] Aid [id number] No INJECT 1.5 MG UNDER THE SKIN ONCE WEEKLY [name], [name], MD Taking Active             Patient Active Problem List   Diagnosis Date Noted   Hypertension associated with diabetes ([place]) 02/25/2022   Pain in left foot 12/31/2021   Pain in left knee 05/23/2019   Morbid obesity ([place]) 08/30/2018   Vitamin D deficiency 08/30/2018   Hyperlipidemia associated with type 2 diabetes mellitus ([place]) 08/30/2018   Transaminitis 08/30/2018    Conditions to be addressed/monitored per PCP order:   community resources  There are no care plans that you recently modified to display for this patient.   Follow up:  Patient agrees to Care Plan and Follow-up.  Plan: The Managed Medicaid care management team will reach out to the patient again over the next 30 days.  Date/time of next scheduled Social Work care management/care coordination outreach:  06/12/22  [name], Arita Miss, [place] Medicaid Team  [phone]

## 2022-05-27 ENCOUNTER — Ambulatory Visit (INDEPENDENT_AMBULATORY_CARE_PROVIDER_SITE_OTHER): Payer: Medicaid Other | Admitting: Internal Medicine

## 2022-05-27 ENCOUNTER — Encounter: Payer: Self-pay | Admitting: Internal Medicine

## 2022-05-27 VITALS — BP 150/84 | Temp 98.7°F | Wt 239.7 lb

## 2022-05-27 DIAGNOSIS — E1169 Type 2 diabetes mellitus with other specified complication: Secondary | ICD-10-CM

## 2022-05-27 DIAGNOSIS — B3731 Acute candidiasis of vulva and vagina: Secondary | ICD-10-CM | POA: Diagnosis not present

## 2022-05-27 DIAGNOSIS — E1159 Type 2 diabetes mellitus with other circulatory complications: Secondary | ICD-10-CM

## 2022-05-27 DIAGNOSIS — E785 Hyperlipidemia, unspecified: Secondary | ICD-10-CM | POA: Diagnosis not present

## 2022-05-27 DIAGNOSIS — I152 Hypertension secondary to endocrine disorders: Secondary | ICD-10-CM

## 2022-05-27 DIAGNOSIS — E1121 Type 2 diabetes mellitus with diabetic nephropathy: Secondary | ICD-10-CM

## 2022-05-27 LAB — POCT GLYCOSYLATED HEMOGLOBIN (HGB A1C): Hemoglobin A1C: 10.8 % — AB (ref 4.0–5.6)

## 2022-05-27 MED ORDER — TRULICITY 3 MG/0.5ML ~~LOC~~ SOAJ: 3.0000 mg | SUBCUTANEOUS | 2 refills | Status: DC

## 2022-05-27 MED ORDER — FLUCONAZOLE 150 MG PO TABS: 150.0000 mg | ORAL_TABLET | Freq: Once | ORAL | 0 refills | Status: AC

## 2022-05-27 MED ORDER — AMLODIPINE BESYLATE 5 MG PO TABS: 5.0000 mg | ORAL_TABLET | Freq: Every day | ORAL | 1 refills | Status: AC

## 2022-05-27 NOTE — Progress Notes (Signed)
Established Patient Office Visit     CC/Reason for Visit: 58-month follow-up chronic medical conditions  HPI: Angelica Berg is a 47 y.o. female who is coming in today for the above mentioned reasons. Past Medical History is significant for: Hypertension, hyperlipidemia, type 2 diabetes, obesity, known medication nonadherence.  Our social work, case management team has been following her.  She has been having some vaginal yeast.  She admits to not being on amlodipine.   Past Medical/Surgical History: Past Medical History:  Diagnosis Date   Diabetes mellitus ([place])    Dyslipidemia     Past Surgical History:  Procedure Laterality Date   CESAREAN SECTION     KNEE ARTHROCENTESIS      Social History:  reports that she quit smoking about 3 years ago. Her smoking use included cigarettes. She has never used smokeless tobacco. She reports current alcohol use. She reports current drug use. Frequency: 7.00 times per week. Drug: Marijuana.  Allergies: No Known Allergies  Family History:  Family History  Problem Relation Age of Onset   Diabetes Mother    Hypertension Mother    Cancer Maternal Grandmother        throat   Heart disease Maternal Grandmother      Current Outpatient Medications:    amoxicillin (AMOXIL) 500 MG tablet, Take 1 tablet (500 mg total) by mouth 3 (three) times daily., Disp: 14 tablet, Rfl: 0   atorvastatin (LIPITOR) 80 MG tablet, TAKE ONE TABLET BY MOUTH DAILY, Disp: 90 tablet, Rfl: 0   Blood Glucose Monitoring Suppl (ACCU-CHEK GUIDE) w/Device KIT, 1 Device by Does not apply route daily. E11.9, Disp: 1 kit, Rfl: 0   Blood Glucose Monitoring Suppl (ONETOUCH VERIO) w/Device KIT, 1 Device by Does not apply route as directed., Disp: 1 kit, Rfl: 0   Dulaglutide (TRULICITY) 3 XN/2.3FT SOPN, Inject 3 mg as directed once a week., Disp: 2 mL, Rfl: 2   estradiol (ESTRACE) 0.1 MG/GM vaginal cream, Insert one gram into vagina twice weekly., Disp: 42.5 g, Rfl: 2    fluconazole (DIFLUCAN) 150 MG tablet, Take 1 tablet (150 mg total) by mouth once for 1 dose., Disp: 1 tablet, Rfl: 0   glucose blood (ACCU-CHEK GUIDE) test strip, Use as instructed to test blood sugar 2 times daily E11.9, Disp: 100 each, Rfl: 12   ibuprofen (ADVIL) 800 MG tablet, Take 1 tablet (800 mg total) by mouth every 8 (eight) hours as needed., Disp: 90 tablet, Rfl: 2   lisinopril (ZESTRIL) 5 MG tablet, TAKE ONE TABLET BY MOUTH DAILY, Disp: 90 tablet, Rfl: 1   metFORMIN (GLUCOPHAGE) 1000 MG tablet, TAKE ONE TABLET BY MOUTH AT BEDTIME, Disp: 90 tablet, Rfl: 1   nystatin ointment (MYCOSTATIN), Apply 1 Application topically 2 (two) times daily., Disp: 30 g, Rfl: 0   triamcinolone ointment (KENALOG) 0.1 %, Apply 1 Application topically 2 (two) times daily., Disp: 30 g, Rfl: 0   amLODipine (NORVASC) 5 MG tablet, Take 1 tablet (5 mg total) by mouth daily., Disp: 90 tablet, Rfl: 1  Review of Systems:  Negative unless indicated in HPI.   Physical Exam: Vitals:   05/27/22 0921 05/27/22 0923  BP: (!) 139/92 (!) 150/84  Temp: 98.7 F (37.1 C)   TempSrc: Oral   Weight: 239 lb 11.2 oz (108.7 kg)     Body mass index is 35.4 kg/m.   Physical Exam Vitals reviewed.  Constitutional:      Appearance: Normal appearance.  HENT:  Head: Normocephalic and atraumatic.  Eyes:     Conjunctiva/sclera: Conjunctivae normal.     Pupils: Pupils are equal, round, and reactive to light.  Cardiovascular:     Rate and Rhythm: Normal rate and regular rhythm.  Pulmonary:     Effort: Pulmonary effort is normal.     Breath sounds: Normal breath sounds.  Skin:    General: Skin is warm and dry.  Neurological:     General: No focal deficit present.     Mental Status: She is alert and oriented to person, place, and time.  Psychiatric:        Mood and Affect: Mood normal.        Behavior: Behavior normal.        Thought Content: Thought content normal.        Judgment: Judgment normal.       Impression and Plan:  Type 2 diabetes mellitus with diabetic nephropathy, without long-term current use of insulin (HCC) - Plan: POC HgB A1c, Dulaglutide (TRULICITY) 3 MP/5.3IR SOPN  Hyperlipidemia associated with type 2 diabetes mellitus ([place])  Hypertension associated with diabetes ([place]) - Plan: amLODipine (NORVASC) 5 MG tablet  Vaginal yeast infection - Plan: fluconazole (DIFLUCAN) 150 MG tablet  -Fluconazole for her vaginal yeast has been prescribed. -A1c is 10.8 down from 12.6.  Continue metformin, increase Trulicity from 1.5 to 3 mg.  Return in 3 months for follow-up. -Blood pressure is not surprisingly uncontrolled as she has not been on amlodipine.  We have discussed importance of compliance with antihypertensive regimen.  Time spent:32 minutes reviewing chart, interviewing and examining patient and formulating plan of care.     Lelon Frohlich, MD [facility]

## 2022-05-29 ENCOUNTER — Other Ambulatory Visit: Payer: Medicaid Other | Admitting: Obstetrics and Gynecology

## 2022-05-29 NOTE — Patient Outreach (Signed)
  Medicaid Managed Care   Unsuccessful Attempt Note   05/29/2022 Name: Angelica Berg MRN: [id number] DOB: 05-03-1975  Referred by: [name], [name], MD Reason for referral : High Risk Managed Medicaid (Unsuccessful telephone outreach)   An unsuccessful telephone outreach was attempted today. The patient was referred to the case management team for assistance with care management and care coordination.    Follow Up Plan: The Managed Medicaid care management team will reach out to the patient again over the next 30 business  days. and The  Patient has been provided with contact information for the Managed Medicaid care management team and has been advised to call with any health related questions or concerns.    [name] RN, BSN [place]  Triad Curator - Managed Medicaid High Risk [phone]

## 2022-05-29 NOTE — Patient Instructions (Signed)
  Hi Ms. Raffel I am sorry I missed you today - as a part of your Medicaid benefit, you are eligible for care management and care coordination services at no cost or copay. I was unable to reach you by phone today but would be happy to help you with your health related needs. Please feel free to call me at [phone]  A member of the Managed Medicaid care management team will reach out to you again over the next 30 business  days.   [name] RN, BSN [place]  Triad Curator - Managed Medicaid High Risk [phone]

## 2022-06-12 ENCOUNTER — Other Ambulatory Visit: Payer: Medicaid Other

## 2022-06-12 NOTE — Patient Outreach (Signed)
BSW contacted patient and was placed on hold. After holding for 2 minutes call was disconnected.   [name], BSW, [place] Managed Medicaid Team  [phone]

## 2022-06-12 NOTE — Patient Instructions (Signed)
  Medicaid Managed Care   Unsuccessful Outreach Note  06/12/2022 Name: Angelica Berg MRN: [id number] DOB: 02/16/1976  Referred by: [name], [name], MD Reason for referral : High Risk Managed Medicaid (MM Social work telephone outreach )   An unsuccessful telephone outreach was attempted today. The patient was referred to the case management team for assistance with care management and care coordination.   Follow Up Plan: A HIPAA compliant phone message was left for the patient providing contact information and requesting a return call.   [name], BSW, [place] Managed Medicaid Team  [phone]

## 2022-06-23 ENCOUNTER — Other Ambulatory Visit: Payer: Self-pay | Admitting: Internal Medicine

## 2022-06-23 DIAGNOSIS — E785 Hyperlipidemia, unspecified: Secondary | ICD-10-CM

## 2022-06-23 DIAGNOSIS — R809 Proteinuria, unspecified: Secondary | ICD-10-CM

## 2022-06-24 ENCOUNTER — Other Ambulatory Visit: Payer: Self-pay | Admitting: Internal Medicine

## 2022-06-24 DIAGNOSIS — E119 Type 2 diabetes mellitus without complications: Secondary | ICD-10-CM

## 2022-06-30 ENCOUNTER — Other Ambulatory Visit: Payer: Medicaid Other | Admitting: Obstetrics and Gynecology

## 2022-06-30 ENCOUNTER — Ambulatory Visit: Payer: Medicaid Other | Admitting: Internal Medicine

## 2022-06-30 NOTE — Patient Instructions (Signed)
Hi Ms. Pawson, I hope you are doing okay-sorry I was unable to reach you today- as a part of your Medicaid benefit, you are eligible for care management and care coordination services at no cost or copay. I was unable to reach you by phone today but would be happy to help you with your health related needs. Please feel free to call me at [phone].  A member of the Managed Medicaid care management team will reach out to you again over the next 30 business  days.   [name] RN, BSN [place]  Triad Curator - Managed Medicaid High Risk [phone]

## 2022-06-30 NOTE — Patient Outreach (Signed)
  Medicaid Managed Care   Unsuccessful Attempt Note   06/30/2022 Name: Angelica Berg MRN: [id number] DOB: 1975-07-04  Referred by: [name], [name], MD Reason for referral : High Risk Managed Medicaid (Unsuccessful telephone outreach)  A second unsuccessful telephone outreach was attempted today. The patient was referred to the case management team for assistance with care management and care coordination.    Follow Up Plan: The Managed Medicaid care management team will reach out to the patient again over the next 30 business  days. and The  Patient has been provided with contact information for the Managed Medicaid care management team and has been advised to call with any health related questions or concerns.   [name] RN, BSN [place]  Triad Curator - Managed Medicaid High Risk [phone]

## 2022-07-02 ENCOUNTER — Ambulatory Visit: Payer: Medicaid Other | Admitting: Internal Medicine

## 2022-07-06 ENCOUNTER — Ambulatory Visit: Payer: Medicaid Other | Admitting: Internal Medicine

## 2022-07-06 ENCOUNTER — Other Ambulatory Visit: Payer: Self-pay | Admitting: Internal Medicine

## 2022-07-06 ENCOUNTER — Telehealth: Payer: Self-pay | Admitting: *Deleted

## 2022-07-06 DIAGNOSIS — E1121 Type 2 diabetes mellitus with diabetic nephropathy: Secondary | ICD-10-CM

## 2022-07-06 DIAGNOSIS — B3731 Acute candidiasis of vulva and vagina: Secondary | ICD-10-CM

## 2022-07-06 MED ORDER — FLUCONAZOLE 150 MG PO TABS: 150.0000 mg | ORAL_TABLET | Freq: Once | ORAL | 0 refills | Status: AC

## 2022-07-06 MED ORDER — TIRZEPATIDE 2.5 MG/0.5ML ~~LOC~~ SOAJ: 2.5000 mg | SUBCUTANEOUS | 0 refills | Status: DC

## 2022-07-06 NOTE — Telephone Encounter (Signed)
Patient is having a hard time filling  Dulaglutide (TRULICITY) 3 0000000 SOPN. She is okay with switching it to something else.  She also would like a refill for her yeast infection until she can get an appointment with GYN.

## 2022-07-06 NOTE — Telephone Encounter (Signed)
Patient is aware 

## 2022-07-15 ENCOUNTER — Encounter: Payer: Self-pay | Admitting: Obstetrics and Gynecology

## 2022-07-15 ENCOUNTER — Other Ambulatory Visit: Payer: Medicaid Other | Admitting: Obstetrics and Gynecology
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
## 2022-07-15 NOTE — Patient Outreach (Signed)
Medicaid Managed Care   Nurse Care Manager Note  07/15/2022 Name:  Angelica Berg MRN:  [id number] DOB:  [birth year]  Angelica Berg is an 47 y.o. year old female who is a primary patient of Angelica Berg, Angelica Halsted, MD.  The [facility] Managed Care Coordination team was consulted for assistance with:    Chronic case management follow up, DM, HTN, HLD  [name] was given information about Medicaid Managed Care Coordination team services today. Angelica Berg Patient agreed to services and verbal consent obtained.  Engaged with patient by telephone for follow up visit in response to provider referral for case management and/or care coordination services.   Assessments/Interventions:  Review of past medical history, allergies, medications, health status, including review of consultants reports, laboratory and other test data, was performed as part of comprehensive evaluation and provision of chronic care management services.  SDOH (Social Determinants of Health) assessments and interventions performed: SDOH Interventions    Flowsheet Row Patient Outreach Telephone from 07/15/2022 in [place] Patient Outreach Telephone from 05/04/2022 in [place] Patient Outreach Telephone from 03/30/2022 in [place] Patient Outreach Telephone from 02/03/2022 in [place] Coordination Office Visit from 11/25/2021 in [place] at [place] Patient Outreach Telephone from 11/12/2021 in [place] Interventions        Housing Interventions -- Intervention Not Indicated -- -- -- Other (Comment)  [refer to SW]  Utilities Interventions Intervention Not Indicated -- -- Intervention Not Indicated -- --  Alcohol Usage Interventions Intervention Not Indicated (Score <7) -- -- Intervention Not Indicated (Score <7) -- --  Depression  Interventions/Treatment  -- -- -- -- Currently on Treatment --  Physical Activity Interventions -- -- Intervention Not Indicated -- -- --  Stress Interventions -- -- Patient Refused  [declines SW services] -- -- --  Social Connections Interventions -- Intervention Not Indicated, Other (Comment)  [patient works 7 days a week right now] -- -- -- --     Care Plan  No Known Allergies  Medications Reviewed Today     Reviewed by Angelica Medicus, RN (Registered Nurse) on 07/15/22 at [phone]  Med List Status: <None>   Medication Order Taking? Sig Documenting Provider Last Dose Status Informant  amLODipine (NORVASC) 5 MG tablet [id number] No Take 1 tablet (5 mg total) by mouth daily.  Patient not taking: Reported on 07/15/2022   Angelica Berg, Angelica Halsted, MD Not Taking Active   amoxicillin (AMOXIL) 500 MG tablet [id number] No Take 1 tablet (500 mg total) by mouth 3 (three) times daily.  Patient not taking: Reported on 07/15/2022   Angelica Bruins, MD Not Taking Active   atorvastatin (LIPITOR) 80 MG tablet [id number] Yes TAKE 1 TABLET BY MOUTH DAILY *NO FURTHER REFILLS Angelica Berg, PER MD* Angelica Berg, Angelica Halsted, MD Taking Active   Blood Glucose Monitoring Suppl (ACCU-CHEK GUIDE) w/Device KIT [id number] Yes 1 Device by Does not apply route daily. E11.9 Berg, Angelica Crazier, MD Taking Active            Med Note [name], Toney Reil Aug 04, 2021 11:03 AM)    Blood Glucose Monitoring Suppl [facility] VERIO) w/Device KIT [id number]  1 Device by Does not apply route as directed. Berg, Angelica Crazier, MD  Active   estradiol (ESTRACE) 0.1 MG/GM vaginal cream [id number] No Insert one gram into vagina twice weekly.  Patient not taking:  Reported on 07/15/2022   Angelica Bruins, MD Not Taking Active   glucose blood (ACCU-CHEK GUIDE) test strip [id number] Yes Use as instructed to test blood sugar 2 times daily E11.9 Angelica Berg, Angelica Halsted, MD Taking Active   ibuprofen (ADVIL) 800 MG  tablet [id number] No Take 1 tablet (800 mg total) by mouth every 8 (eight) hours as needed.  Patient not taking: Reported on 07/15/2022   Angelica Hams, MD Not Taking Active   lisinopril (ZESTRIL) 5 MG tablet [id number] Yes TAKE 1 TABLET BY MOUTH DAILY Angelica Berg, Angelica Halsted, MD Taking Active   metFORMIN (GLUCOPHAGE) 1000 MG tablet [id number] Yes TAKE 1 TABLET BY MOUTH AT BEDTIME Angelica Berg, Angelica Halsted, MD Taking Active   nystatin ointment (MYCOSTATIN) [id number] No Apply 1 Application topically 2 (two) times daily.  Patient not taking: Reported on 07/15/2022   Angelica Bruins, MD Not Taking Active   tirzepatide [facility]) 2.5 MG/0.5ML Pen [id number]  Inject 2.5 mg into the skin once a week. Angelica Berg, Angelica Halsted, MD  Active   triamcinolone ointment (KENALOG) 0.1 % [id number] No Apply 1 Application topically 2 (two) times daily.  Patient not taking: Reported on 07/15/2022   Angelica Bruins, MD Not Taking Active            Patient Active Problem List   Diagnosis Date Noted   Hypertension associated with diabetes ([place]) 02/25/2022   Pain in left foot 12/31/2021   Pain in left knee 05/23/2019   Morbid obesity ([place]) 08/30/2018   Vitamin D deficiency 08/30/2018   Hyperlipidemia associated with type 2 diabetes mellitus ([place]) 08/30/2018   Transaminitis 08/30/2018   Conditions to be addressed/monitored per PCP order:  Chronic case management follow up, DM, HTN, HLD  Care Plan : RN Care Manager Plan Of Care  Updates made by Angelica Medicus, RN since 07/15/2022 12:00 AM     Problem: Knowledge Deficit and Care Coordination Needs Related to Management of  DM, obesity and HLD   Priority: High     Long-Range Goal: Development of Plan Of Care to Address Care Coordination Needs and Knowledge Deficits for Management of DM, obesity, HLD   Start Date: 04/22/2021  Expected End Date: 10/15/2022  Priority: High  Note:   Current Barriers:  Knowledge Deficits related to plan of care for  management of HLD, DMII, HTN and Obesity  07/15/22:  Patient with known medication non adherence-not taking all meds as prescribed.  Blood sugars 300s per patient.  Vaginal itching continues to be a problem for patient-to follow up with PCP/GYN.  Patient to follow up on medications as well.  Does not check BP. RNCM Clinical Goal(s):  Patient will verbalize basic understanding of HLD, DMII, and obesity disease process and self health management plan as evidenced by improved Hgb A1C, weight loss or no weight gain, normal lipid panel take all medications exactly as prescribed and will call provider for medication related questions as evidenced by patient reporting improvement in taken medications as prescribed    demonstrate understanding of rationale for each prescribed medication as evidenced by patient able to report to provider purpose of medication    attend all scheduled medical appointments: with primary care provider     demonstrate improved adherence to prescribed treatment plan for HLD, DMII, and Obesity as evidenced by improved Hgb A1C, weight loss of no weight gain, improvement in lipid panel continue to work with Consulting civil engineer and/or Social Worker to address care management and care coordination needs related  to HLD, DMII, and Obesity as evidenced by adherence to CM Team Scheduled appointments  through collaboration with RN Care manager, provider, and care team.  Work with pharmacist to address inconsistent medication taking behavior and personal feeling regarding medications. i.e. "I don't like taking so many medications" and appropriateness of adding Tirzepatide to medication regimen in place of Trulicity  Interventions: Inter-disciplinary care team collaboration (see longitudinal plan of care) Evaluation of current treatment plan related to  self management and patient's adherence to plan as established by provider Care guide referral for dental resources-completed. Collaborated with  Care Guide for resources. Encouraged patient to take all medications BSW follow up appointment scheduled. BSW completed a telephone outreach with patient. She states that her lights did get turned off but she was able to gather the money to get them turned back on. She does have a balance of about 500 and has completed an application with DSS for them to pay it. Patient is now waiting on a telephone call back from them. No other resources are needed at this time.  Hypertension Interventions:  (Status:  New goal.) Long Term Goal Last practice recorded BP readings:  BP Readings from Last 3 Encounters:  05/27/22 (!) 150/84  02/25/22 (!) 136/94  12/29/21 (!) 130/90  Most recent eGFR/CrCl: No results found for: "EGFR"  No components found for: "CRCL"  Evaluation of current treatment plan related to hypertension self management and patient's adherence to plan as established by provider Reviewed medications with patient and discussed importance of compliance Discussed plans with patient for ongoing care management follow up and provided patient with direct contact information for care management team Advised patient, providing education and rationale, to monitor blood pressure daily and record, calling PCP for findings outside established parameters Reviewed scheduled/upcoming provider appointments  Assessed social determinant of health barriers   Interdisciplinary Collaboration Interventions:  (Status: Goal on track:  NO.) Long Term Goal  - Collaborated with BSW to initiate plan of care to address needs related to Financial constraints related to limited income, Limited access to food, and Lacks knowledge of community resource: to address food and financial insecurity and dental resources  in patient with HLD, DMII, and Obesity Collaboration with Angelica Berg, Angelica Halsted, MD regarding development and update of comprehensive plan of care as evidenced by provider attestation and  co-signature Inter-disciplinary care team collaboration   Referral placed with the pharmacist to address medication taking behavior and patient's personal feelings of "I don't like taking so many medications"  Schedule initial telephone appointment with Managed Medicaid BSW [name] to address financial insecurities and patient's inability to pay rent Referral sent to community care guide requesting resources provided in January related to financial insecurity and utilities be resent to patient  07/28/21: BSW completed telephone outreach with patient regarding rent and financial insecurity. BSW explained what happened with her rent and stated she is going back to court on 08/06/21. Patient stated that she does need assistance with her back rent and utilities. Patient states her rent is about 8,000 and utilities are about 1500. BSW provided patient with the phone number for legal aid and encouraged patient to try the resources provided by the care guide.  08/01/21: BSW returned patients telephone call. She stated she spoke with CG and the email was not coming through. Patient stated she spoke with DSS and are case was closed. BSW informed patient she should reapply. Bsw will send a message to leadership about patient getting assistance.   Diabetes:  (  Status: Goal on track: NO.) Long Term Goal - patient states she is taking Trulicity as prescribed and remains interested in referral to the [facility] and [place]  Lab Results  Component Value Date   HGBA1C 8.4 (A) 02/25/2021       HGBA1C                                                      12.6                                   02/25/2022      HGBA1C                                                      10.8                                  05/27/22 Provided education to patient about basic DM disease process; Reviewed medications with patient and discussed importance of medication adherence;        Reviewed prescribed diet with patient  CHO controlled; Discussed plans with patient for ongoing care management follow up and provided patient with direct contact information for care management team;            Hyperlipidemia:  (Status: Condition stable. Not addressed this visit.) Long Term Goal  Lab Results  Component Value Date   CHOL 113 08/21/2020   HDL 44.70 08/21/2020   LDLCALC 52 08/21/2020   TRIG 80.0 08/21/2020   CHOLHDL 3 08/21/2020    Medication review performed; medication list updated in electronic medical record.   Weight Loss:  (Status: Goal on track: NO.) Long term Goal-  Patient would like referral to [facility] [place] DM metabolic deficits including decreased satiety signaling to brain from intestines and newer medication Tirzepatide used to treat DM/ lipid/BP . Encouraged patient to discuss referral to [place] [place] to assist her with weight management and glycemic control when she sees her primary care provider  Patient Goals/Self-Care Activities: Take medications as prescribed   Attend all scheduled provider appointments Call pharmacy for medication refills 3-7 days in advance of running out of medications Perform all self care activities independently  Perform IADL's (shopping, preparing meals, housekeeping, managing finances) independently Call provider office for new concerns or questions  set goal weight fill half of plate with vegetables manage portion size switch to sugar-free drinks - call for medicine refill 2 or 3 days before it runs out - take all medications exactly as prescribed - call doctor with any symptoms you believe are related to your medicine - call doctor when you experience any new symptoms - go to all doctor appointments as scheduled - adhere to prescribed diet: CHO controlled - discuss referral to [facility] [place] with primary care provider Patient to follow up on test strip Patient to contact  pharmacy/PCP/GYN regarding medications   Long-Range Goal: Establish Plan of Care for Chronic Disease Management Needs   Priority:  High  Note:   Timeframe:  Long-Range Goal Priority:  High Start Date:     08/18/21                        Expected End Date: ongoing                      Follow Up Date: 4/29//24  - practice safe sex - schedule appointment for flu shot - schedule appointment for vaccines needed due to my age or health - schedule recommended health tests (blood work, mammogram, colonoscopy, pap test) - schedule and keep appointment for annual check-up    Why is this important?   Screening tests can find diseases early when they are easier to treat.  Your doctor or nurse will talk with you about which tests are important for you.  Getting shots for common diseases like the flu and shingles will help prevent them.   07/15/22:  Patient to f/u with PCP   Follow Up:  Patient agrees to Care Plan and Follow-up.  Plan: The Managed Medicaid care management team will reach out to the patient again over the next 30 business  days. and The  Patient has been provided with contact information for the Managed Medicaid care management team and has been advised to call with any health related questions or concerns.  Date/time of next scheduled RN care management/care coordination outreach: 08/17/22 at 0900.

## 2022-07-20 ENCOUNTER — Telehealth: Payer: Self-pay | Admitting: Internal Medicine

## 2022-07-20 NOTE — Telephone Encounter (Signed)
Pt states she cannot get her tirzepatide [facility]) 2.5 MG/0.5ML Pen says pharmacy is asking for a PA. Patient requesting a call with an update, says she is concerned about her elevated glucose level

## 2022-07-23 ENCOUNTER — Telehealth: Payer: Self-pay

## 2022-07-23 ENCOUNTER — Other Ambulatory Visit (HOSPITAL_COMMUNITY): Payer: Self-pay

## 2022-07-23 NOTE — Telephone Encounter (Signed)
Submitted PA via CMM. Key: [id number]. Status pending

## 2022-07-23 NOTE — Telephone Encounter (Signed)
Pharmacy Patient Advocate Encounter   Received notification from [place] that prior authorization for Mounjaro 2.5MG /0.5ML pen-injectors is required/requested.  Per Test Claim: PA required   PA submitted on 07/23/22 to (ins) [employer] via CoverMyMeds Key or (Medicaid) confirmation # [id number] Status is pending

## 2022-07-24 ENCOUNTER — Encounter: Payer: Self-pay | Admitting: Obstetrics & Gynecology

## 2022-07-24 ENCOUNTER — Ambulatory Visit (INDEPENDENT_AMBULATORY_CARE_PROVIDER_SITE_OTHER): Payer: Medicaid Other | Admitting: Obstetrics & Gynecology

## 2022-07-24 VITALS — BP 120/80 | HR 78 | Resp 16

## 2022-07-24 DIAGNOSIS — N898 Other specified noninflammatory disorders of vagina: Secondary | ICD-10-CM | POA: Diagnosis not present

## 2022-07-24 DIAGNOSIS — R300 Dysuria: Secondary | ICD-10-CM

## 2022-07-24 DIAGNOSIS — B3731 Acute candidiasis of vulva and vagina: Secondary | ICD-10-CM

## 2022-07-24 LAB — WET PREP FOR TRICH, YEAST, CLUE

## 2022-07-24 MED ORDER — FLUCONAZOLE 150 MG PO TABS: 150.0000 mg | ORAL_TABLET | ORAL | 3 refills | Status: AC

## 2022-07-24 NOTE — Progress Notes (Signed)
    Angelica Berg 09/09/1975 [id number]        47 y.o.  [id number]   RP: C/o external vulvar itching, occ burning after urination   HPI: Patient c/o external vulvar itching, occ burning after urination.  Abstinent x 5 yrs.  No pelvic pain.  No fever.  DM type 2 not controled.  Needs to schedule visit with Endocrino.   OB History  Gravida Para Term Preterm AB Living  2 1 1   1 2   SAB IAB Ectopic Multiple Live Births  1     1 2     # Outcome Date GA Lbr Len/2nd Weight Sex Delivery Anes PTL Lv  2 SAB           1A Term           1B Term             Past medical history,surgical history, problem list, medications, allergies, family history and social history were all reviewed and documented in the EPIC chart.   Directed ROS with pertinent positives and negatives documented in the history of present illness/assessment and plan.  Exam:  Vitals:   07/24/22 1343  BP: 120/80  Pulse: 78  Resp: 16   General appearance:  Normal  Abdomen: Normal  Gynecologic exam: Vulva with mild irritation.  Speculum:  Cervix/vagina normal.  Increased d/c.  Wet prep done.  Wet prep: Yeasts present  U/A: Yellow, slightly cloudy, Glu 2+, Pro 1+, Nit Neg, WBC 0-5, RBC Neg, Bacteria few.  Pending U. Culture.   Assessment/Plan:  47 y.o. G2P1012   1. Vaginal itching Patient c/o external vulvar itching, occ burning after urination.  Abstinent x 5 yrs.  No pelvic pain.  No fever.  DM type 2 not controled.  Needs to schedule visit with Endocrino. - WET PREP FOR TRICH, YEAST, CLUE  2. Burning with urination Will wait on U. Culture. - Urinalysis,Complete w/RFL Culture  Other orders - fluconazole (DIFLUCAN) 150 MG tablet; Take 1 tablet (150 mg total) by mouth every other day for 3 doses.   Angelica Del MD, 1:46 PM 07/24/2022

## 2022-07-24 NOTE — Telephone Encounter (Signed)
Pharmacy Patient Advocate Encounter  Received notification from [employer] that the request for prior authorization for Mounjaro 2.5MG/0.5ML pen-injectors  has been denied due to not trying one preferred drug.       Please be advised we currently do not have a Pharmacist to review denials, therefore you will need to process appeals accordingly as needed. Thanks for your support at this time.   You may call [phone] or fax [phone], to appeal.  Denial letter attached to charts  

## 2022-07-24 NOTE — Telephone Encounter (Signed)
Pharmacy Patient Advocate Encounter  Received notification from [employer] that the request for prior authorization for Mounjaro 2.5MG /0.5ML pen-injectors  has been denied due to not trying one preferred drug.       Please be advised we currently do not have a Pharmacist to review denials, therefore you will need to process appeals accordingly as needed. Thanks for your support at this time.   You may call [phone] or fax [phone], to appeal.  Denial letter attached to charts

## 2022-07-27 LAB — URINALYSIS, COMPLETE W/RFL CULTURE
Bilirubin Urine: NEGATIVE
Casts: NONE SEEN /LPF
Crystals: NONE SEEN /HPF
Hgb urine dipstick: NEGATIVE
Hyaline Cast: NONE SEEN /LPF
Ketones, ur: NEGATIVE
Leukocyte Esterase: NEGATIVE
Nitrites, Initial: NEGATIVE
RBC / HPF: NONE SEEN /HPF (ref 0–2)
Specific Gravity, Urine: 1.025 (ref 1.001–1.035)
Yeast: NONE SEEN /HPF
pH: 5 (ref 5.0–8.0)

## 2022-07-27 LAB — URINE CULTURE
MICRO NUMBER:: 14788159
SPECIMEN QUALITY:: ADEQUATE

## 2022-07-27 LAB — CULTURE INDICATED

## 2022-07-28 ENCOUNTER — Other Ambulatory Visit: Payer: Self-pay

## 2022-07-28 DIAGNOSIS — A491 Streptococcal infection, unspecified site: Secondary | ICD-10-CM

## 2022-07-28 MED ORDER — AMOXICILLIN 500 MG PO CAPS: 500.0000 mg | ORAL_CAPSULE | Freq: Three times a day (TID) | ORAL | 0 refills | Status: AC

## 2022-07-28 NOTE — Telephone Encounter (Signed)
Pt calling stating she is in need of a medication to manage her diabetes, her sugar levels are high and she is getting yeast infections. Says she is waiting for the [facility] and is requesting a call for guidance.

## 2022-07-29 ENCOUNTER — Telehealth: Payer: Self-pay | Admitting: Internal Medicine

## 2022-07-29 NOTE — Telephone Encounter (Signed)
Left a detailed message at the patient's cell number with the information below and requested patient call back with her preference.

## 2022-07-29 NOTE — Telephone Encounter (Signed)
Pt states her tirzepatide [facility]) 2.5 MG/0.5ML Pen  [id number] - [place], [place] - [address] AT [address] Phone: [phone]  Fax: [phone]     Says she received a call from someone in the office by the name of Claris Gower, did not see a note.

## 2022-07-30 MED ORDER — OZEMPIC (0.25 OR 0.5 MG/DOSE) 2 MG/3ML ~~LOC~~ SOPN: 0.5000 mg | PEN_INJECTOR | SUBCUTANEOUS | 2 refills | Status: DC

## 2022-07-30 NOTE — Telephone Encounter (Signed)
Rx sent. Left message on machine for patient to return our call. 

## 2022-07-30 NOTE — Telephone Encounter (Signed)
Patient is aware.  Patient would like to do ask Dr Ardyth Harps has advised.  She would like to know if she should continue the Trulicity as well as Ozempic?  She would also like to know if she can go back on glipizide, because it helped bring her glucose levels down in the past?  Please advise

## 2022-07-30 NOTE — Addendum Note (Signed)
Addended by: Kern Reap B on: 07/30/2022 02:27 PM   Modules accepted: Orders

## 2022-08-03 NOTE — Telephone Encounter (Signed)
Patient is aware 

## 2022-08-17 ENCOUNTER — Other Ambulatory Visit: Payer: Medicaid Other | Admitting: Obstetrics and Gynecology

## 2022-08-17 ENCOUNTER — Encounter: Payer: Self-pay | Admitting: Obstetrics and Gynecology

## 2022-08-17 NOTE — Patient Outreach (Signed)
Medicaid Managed Care   Nurse Care Manager Note  08/17/2022 Name:  Angelica Berg MRN:  [id number] DOB:  1975/06/23  Angelica Berg is an 47 y.o. year old female who is a primary patient of Angelica Berg, Angelica Patricia, MD.  The [facility] Managed Care Coordination team was consulted for assistance with:    Chronic healthcare management needs, HLD, HTN, DM  Ms. Yaklin was given information about Medicaid Managed Care Coordination team services today. Angelica Berg Patient agreed to services and verbal consent obtained.  Engaged with patient by telephone for follow up visit in response to provider referral for case management and/or care coordination services.   Assessments/Interventions:  Review of past medical history, allergies, medications, health status, including review of consultants reports, laboratory and other test data, was performed as part of comprehensive evaluation and provision of chronic care management services.  SDOH (Social Determinants of Health) assessments and interventions performed: SDOH Interventions    Flowsheet Row Patient Outreach Telephone from 08/17/2022 in [place] POPULATION HEALTH DEPARTMENT Patient Outreach Telephone from 07/15/2022 in [place] POPULATION HEALTH DEPARTMENT Patient Outreach Telephone from 05/04/2022 in [place] POPULATION HEALTH DEPARTMENT Patient Outreach Telephone from 03/30/2022 in  POPULATION HEALTH DEPARTMENT Patient Outreach Telephone from 02/03/2022 in [facility] Community Care Coordination Office Visit from 11/25/2021 in [facility] [place]  SDOH Interventions        Housing Interventions -- -- Intervention Not Indicated -- -- --  Utilities Interventions -- Intervention Not Indicated -- -- Intervention Not Indicated --  Alcohol Usage Interventions -- Intervention Not Indicated (Score <7) -- -- Intervention Not Indicated (Score <7) --  Depression Interventions/Treatment  -- -- -- --  -- Currently on Treatment  Physical Activity Interventions Patient Refused -- -- Intervention Not Indicated -- --  Stress Interventions Intervention Not Indicated -- -- Patient Refused  [declines SW services] -- --  Social Connections Interventions -- -- Intervention Not Indicated, Other (Comment)  [patient works 7 days a week right now] -- -- --     Care Plan  No Known Allergies  Medications Reviewed Today     Reviewed by [name], RN (Registered Nurse) on 08/17/22 at 0930  Med List Status: <None>   Medication Order Taking? Sig Documenting Provider Last Dose Status Informant  amLODipine (NORVASC) 5 MG tablet [id number]  Take 1 tablet (5 mg total) by mouth daily.  Patient not taking: Reported on 07/15/2022   Angelica Berg, Angelica Patricia, MD  Active   atorvastatin (LIPITOR) 80 MG tablet [id number]  TAKE 1 TABLET BY MOUTH DAILY *NO FURTHER REFILLS Angelica Berg, PER MD* Angelica Berg, Angelica Patricia, MD  Active   Blood Glucose Monitoring Suppl (ACCU-CHEK GUIDE) w/Device KIT [id number]  1 Device by Does not apply route daily. E11.9 [name], Konrad Dolores, MD  Active            Med Note Clearance Coots, Mel Almond Aug 04, 2021 11:03 AM)    Blood Glucose Monitoring Suppl [facility] VERIO) w/Device KIT [id number]  1 Device by Does not apply route as directed. [name], Konrad Dolores, MD  Active   estradiol (ESTRACE) 0.1 MG/GM vaginal cream [id number]  Insert one gram into vagina twice weekly.  Patient not taking: Reported on 07/15/2022   Angelica Del, MD  Active   glucose blood (ACCU-CHEK GUIDE) test strip [id number]  Use as instructed to test blood sugar 2 times daily E11.9 Angelica Berg, Angelica Patricia, MD  Active   ibuprofen (  ADVIL) 800 MG tablet [id number]  Take 1 tablet (800 mg total) by mouth every 8 (eight) hours as needed. Angelica Bong, MD  Active   lisinopril (ZESTRIL) 5 MG tablet [id number]  TAKE 1 TABLET BY MOUTH DAILY Angelica Berg, Angelica Patricia, MD  Active   metFORMIN  (GLUCOPHAGE) 1000 MG tablet [id number]  TAKE 1 TABLET BY MOUTH AT BEDTIME Angelica Berg, Angelica Patricia, MD  Active   Semaglutide,0.25 or 0.5MG /DOS, (OZEMPIC, 0.25 OR 0.5 MG/DOSE,) 2 MG/3ML SOPN [id number] No Inject 0.5 mg into the skin every 7 (seven) weeks.  Patient not taking: Reported on 08/17/2022   Angelica Berg, Angelica Patricia, MD Not Taking Active            Patient Active Problem List   Diagnosis Date Noted   Hypertension associated with diabetes (HCC) 02/25/2022   Pain in left foot 12/31/2021   Pain in left knee 05/23/2019   Morbid obesity (HCC) 08/30/2018   Vitamin D deficiency 08/30/2018   Hyperlipidemia associated with type 2 diabetes mellitus (HCC) 08/30/2018   Transaminitis 08/30/2018   Conditions to be addressed/monitored per PCP order:  Chronic healthcare management needs, HLD, HTN, DM  Care Plan : RN Care Manager Plan Of Care  Updates made by [name], RN since 08/17/2022 12:00 AM     Problem: Knowledge Deficit and Care Coordination Needs Related to Management of  DM, obesity and HLD   Priority: High     Long-Range Goal: Development of Plan Of Care to Address Care Coordination Needs and Knowledge Deficits for Management of DM, obesity, HLD   Start Date: 04/22/2021  Expected End Date: 10/15/2022  Priority: High  Note:   Current Barriers:  Knowledge Deficits related to plan of care for management of HLD, DMII, HTN and Obesity  08/17/22:  Patient with blood sugars in 300s-checks occasionally-to schedule a f/u appt with PCP and/or ENDO to discuss medications  RNCM Clinical Goal(s):  Patient will verbalize basic understanding of HLD, DMII, and obesity disease process and self health management plan as evidenced by improved Hgb A1C, weight loss or no weight gain, normal lipid panel take all medications exactly as prescribed and will call provider for medication related questions as evidenced by patient reporting improvement in taken medications as prescribed     demonstrate understanding of rationale for each prescribed medication as evidenced by patient able to report to provider purpose of medication    attend all scheduled medical appointments: with primary care provider     demonstrate improved adherence to prescribed treatment plan for HLD, DMII, and Obesity as evidenced by improved Hgb A1C, weight loss of no weight gain, improvement in lipid panel continue to work with RN Care Manager and/or Social Worker to address care management and care coordination needs related to HLD, DMII, and Obesity as evidenced by adherence to CM Team Scheduled appointments  through collaboration with RN Care manager, provider, and care team.  Work with pharmacist to address inconsistent medication taking behavior and personal feeling regarding medications. i.e. "I don't like taking so many medications" and appropriateness of adding Tirzepatide to medication regimen in place of Trulicity  Interventions: Inter-disciplinary care team collaboration (see longitudinal plan of care) Evaluation of current treatment plan related to  self management and patient's adherence to plan as established by provider Care guide referral for dental resources-completed. Collaborated with Care Guide for resources. Encouraged patient to take all medications Patient to schedule  an appt with ENDO and/or PCP  Hypertension Interventions:  (Status:  New goal.) Long Term Goal Last practice recorded BP readings:  BP Readings from Last 3 Encounters:  05/27/22 (!) 150/84  02/25/22 (!) 136/94  12/29/21 (!) 130/90  08/17/22:  Patient does not check BP  Most recent eGFR/CrCl: No results found for: "EGFR"  No components found for: "CRCL"  Evaluation of current treatment plan related to hypertension self management and patient's adherence to plan as established by provider Reviewed medications with patient and discussed importance of compliance Discussed plans with patient for ongoing care  management follow up and provided patient with direct contact information for care management team Advised patient, providing education and rationale, to monitor blood pressure daily and record, calling PCP for findings outside established parameters Reviewed scheduled/upcoming provider appointments  Assessed social determinant of health barriers   Interdisciplinary Collaboration Interventions:  (Status: Goal on track:  NO.) Long Term Goal  - Collaborated with BSW to initiate plan of care to address needs related to Financial constraints related to limited income, Limited access to food, and Lacks knowledge of community resource: to address food and financial insecurity and dental resources  in patient with HLD, DMII, and Obesity Collaboration with Angelica Berg, Angelica Patricia, MD regarding development and update of comprehensive plan of care as evidenced by provider attestation and co-signature Inter-disciplinary care team collaboration   Referral placed with the pharmacist to address medication taking behavior and patient's personal feelings of "I don't like taking so many medications"  Schedule initial telephone appointment with Managed Medicaid BSW [name] to address financial insecurities and patient's inability to pay rent Referral sent to community care guide requesting resources provided in January related to financial insecurity and utilities be resent to patient   Diabetes:  (Status: Goal on track: NO.) Long Term Goal - patient states she is taking Trulicity as prescribed and remains interested in [facility] to [facility]  Lab Results  Component Value Date   HGBA1C 8.4 (A) 02/25/2021       HGBA1C                                                      12.6                                   02/25/2022      HGBA1C                                                      10.8                                  05/27/22  Provided education to patient about basic DM  disease process; Reviewed medications with patient and discussed importance of medication adherence;        Reviewed prescribed diet with patient CHO controlled; Discussed plans with patient for ongoing care management follow up and provided patient with direct contact information for care management team;            Hyperlipidemia:  (Status: Condition stable. Not addressed  this visit.) Long Term Goal  Lab Results  Component Value Date   CHOL 113 08/21/2020   HDL 44.70 08/21/2020   LDLCALC 52 08/21/2020   TRIG 80.0 08/21/2020   CHOLHDL 3 08/21/2020    Medication review performed; medication list updated in electronic medical record.   Weight Loss:  (Status: Goal on track: NO.) Long term Goal-  Patient would like [facility] to [facility] Reviewed DM metabolic deficits including decreased satiety signaling to brain from intestines and newer medication Tirzepatide used to treat DM/ lipid/BP . Encouraged patient to discuss referral to [facility] to assist her with weight management and glycemic control when she sees her primary care provider  Patient Goals/Self-Care Activities: Take medications as prescribed   Attend all scheduled provider appointments Call pharmacy for medication refills 3-7 days in advance of running out of medications Perform all self care activities independently  Perform IADL's (shopping, preparing meals, housekeeping, managing finances) independently Call provider office for new concerns or questions  set goal weight fill half of plate with vegetables manage portion size switch to sugar-free drinks - call for medicine refill 2 or 3 days before it runs out - take all medications exactly as prescribed - call doctor with any symptoms you believe are related to your medicine - call doctor when you experience any new symptoms - go to all doctor appointments as scheduled - adhere to prescribed diet: CHO  controlled - discuss [facility] with primary care provider Patient to follow up on test strip Patient to contact pharmacy/PCP/GYN regarding medications   Long-Range Goal: Establish Plan of Care for Chronic Disease Management Needs   Priority: High  Note:   Timeframe:  Long-Range Goal Priority:  High Start Date:     08/18/21                        Expected End Date: ongoing                      Follow Up Date: 09/16/22  - practice safe sex - schedule appointment for flu shot - schedule appointment for vaccines needed due to my age or health - schedule recommended health tests (blood work, mammogram, colonoscopy, pap test) - schedule and keep appointment for annual check-up    Why is this important?   Screening tests can find diseases early when they are easier to treat.  Your doctor or nurse will talk with you about which tests are important for you.  Getting shots for common diseases like the flu and shingles will help prevent them.   08/17/22:  Patient with PCP appt in March and GYN appt in April, to f/u with PCP and/or schedule an appt with ENDO   Follow Up:  Patient agrees to Care Plan and Follow-up.  Plan: The Managed Medicaid care management team will reach out to the patient again over the next 30 business  days. and The  Patient has been provided with contact information for the Managed Medicaid care management team and has been advised to call with any health related questions or concerns.  Date/time of next scheduled RN care management/care coordination outreach: 09/16/22 at 0900

## 2022-08-17 NOTE — Patient Instructions (Signed)
Hi Ms. Baruch, thank you for speaking with me this morning-have a great day and week!  Don't forget to call the Endocrinologist or your PCP.  Thank you!!  Angelica Berg was given information about Medicaid Managed Care team care coordination services as a part of their [facility] Community Plan Medicaid benefit. Angelica Berg verbally consented to engagement with the [facility] Managed Care team.   If you are experiencing a medical emergency, please call 911 or report to your local emergency department or urgent care.   If you have a non-emergency medical problem during routine business hours, please contact your provider's office and ask to speak with a nurse.   For questions related to your [facility], please call: [phone] or visit the homepage here: [email or web address]  If you would like to schedule transportation through your [facility], please call the following number at least 2 days in advance of your appointment: [phone]   Rides for urgent appointments can also be made after hours by calling Member Services.  Call the Behavioral Health Crisis Line at [phone], at any time, 24 hours a day, 7 days a week. If you are in danger or need immediate medical attention call 911.  If you would like help to quit smoking, call 1-800-QUIT-NOW (256-538-8858) OR Espaol: 1-855-Djelo-Ya (2-706-237-6283) o para ms informacin haga clic aqu or Text READY to 151-761 to register via text  [name] - following are the goals we discussed in your visit today:   Goals Addressed   None   Timeframe:  Long-Range Goal Priority:  High Start Date:     08/18/21                        Expected End Date: ongoing                      Follow Up Date: 09/16/22  - practice safe sex - schedule appointment for flu shot - schedule appointment for vaccines needed due to my age or health -  schedule recommended health tests (blood work, mammogram, colonoscopy, pap test) - schedule and keep appointment for annual check-up    Why is this important?   Screening tests can find diseases early when they are easier to treat.  Your doctor or nurse will talk with you about which tests are important for you.  Getting shots for common diseases like the flu and shingles will help prevent them.   08/17/22:  Patient with PCP appt in March and GYN appt in April, to f/u with PCP and/or schedule an appt with ENDO  Patient verbalizes understanding of instructions and care plan provided today and agrees to view in MyChart. Active MyChart status and patient understanding of how to access instructions and care plan via MyChart confirmed with patient.     The Managed Medicaid care management team will reach out to the patient again over the next 30 business  days.  The  Patient  has been provided with contact information for the Managed Medicaid care management team and has been advised to call with any health related questions or concerns.   [name] RN, BSN [place]  [facility] Care Management Coordinator - Managed Medicaid High Risk [phone]   Following is a copy of your plan of care:  Care Plan : RN Care Manager Plan Of Care  Updates made by [name], RN since 08/17/2022 12:00 AM  Problem: Knowledge Deficit and Care Coordination Needs Related to Management of  DM, obesity and HLD   Priority: High     Long-Range Goal: Development of Plan Of Care to Address Care Coordination Needs and Knowledge Deficits for Management of DM, obesity, HLD   Start Date: 04/22/2021  Expected End Date: 10/15/2022  Priority: High  Note:   Current Barriers:  Knowledge Deficits related to plan of care for management of HLD, DMII, HTN and Obesity  08/17/22:  Patient with blood sugars in 300s-checks occasionally-to schedule a f/u appt with PCP and/or ENDO to discuss medications  RNCM  Clinical Goal(s):  Patient will verbalize basic understanding of HLD, DMII, and obesity disease process and self health management plan as evidenced by improved Hgb A1C, weight loss or no weight gain, normal lipid panel take all medications exactly as prescribed and will call provider for medication related questions as evidenced by patient reporting improvement in taken medications as prescribed    demonstrate understanding of rationale for each prescribed medication as evidenced by patient able to report to provider purpose of medication    attend all scheduled medical appointments: with primary care provider     demonstrate improved adherence to prescribed treatment plan for HLD, DMII, and Obesity as evidenced by improved Hgb A1C, weight loss of no weight gain, improvement in lipid panel continue to work with Medical illustrator and/or Social Worker to address care management and care coordination needs related to HLD, DMII, and Obesity as evidenced by adherence to CM Team Scheduled appointments  through collaboration with RN Care manager, provider, and care team.  Work with pharmacist to address inconsistent medication taking behavior and personal feeling regarding medications. i.e. "I don't like taking so many medications" and appropriateness of adding Tirzepatide to medication regimen in place of Trulicity  Interventions: Inter-disciplinary care team collaboration (see longitudinal plan of care) Evaluation of current treatment plan related to  self management and patient's adherence to plan as established by provider Care guide referral for dental resources-completed. Collaborated with Care Guide for resources. Encouraged patient to take all medications Patient to schedule  an appt with ENDO and/or PCP  Hypertension Interventions:  (Status:  New goal.) Long Term Goal Last practice recorded BP readings:  BP Readings from Last 3 Encounters:  05/27/22 (!) 150/84  02/25/22 (!) 136/94  12/29/21  (!) 130/90  08/17/22:  Patient does not check BP  Most recent eGFR/CrCl: No results found for: "EGFR"  No components found for: "CRCL"  Evaluation of current treatment plan related to hypertension self management and patient's adherence to plan as established by provider Reviewed medications with patient and discussed importance of compliance Discussed plans with patient for ongoing care management follow up and provided patient with direct contact information for care management team Advised patient, providing education and rationale, to monitor blood pressure daily and record, calling PCP for findings outside established parameters Reviewed scheduled/upcoming provider appointments  Assessed social determinant of health barriers   Interdisciplinary Collaboration Interventions:  (Status: Goal on track:  NO.) Long Term Goal  - Collaborated with BSW to initiate plan of care to address needs related to Financial constraints related to limited income, Limited access to food, and Lacks knowledge of community resource: to address food and financial insecurity and dental resources  in patient with HLD, DMII, and Obesity Collaboration with Philip Aspen, Limmie Patricia, MD regarding development and update of comprehensive plan of care as evidenced by provider attestation and co-signature Inter-disciplinary care team collaboration  Referral placed with the pharmacist to address medication taking behavior and patient's personal feelings of "I don't like taking so many medications"  Schedule initial telephone appointment with Managed Medicaid BSW [name] to address financial insecurities and patient's inability to pay rent Referral sent to community care guide requesting resources provided in January related to financial insecurity and utilities be resent to patient   Diabetes:  (Status: Goal on track: NO.) Long Term Goal - patient states she is taking Trulicity as prescribed and remains interested in  referral to [facility]  Lab Results  Component Value Date   HGBA1C 8.4 (A) 02/25/2021       HGBA1C                                                      12.6                                   02/25/2022      HGBA1C                                                      10.8                                  05/27/22  Provided education to patient about basic DM disease process; Reviewed medications with patient and discussed importance of medication adherence;        Reviewed prescribed diet with patient CHO controlled; Discussed plans with patient for ongoing care management follow up and provided patient with direct contact information for care management team;            Hyperlipidemia:  (Status: Condition stable. Not addressed this visit.) Long Term Goal  Lab Results  Component Value Date   CHOL 113 08/21/2020   HDL 44.70 08/21/2020   LDLCALC 52 08/21/2020   TRIG 80.0 08/21/2020   CHOLHDL 3 08/21/2020    Medication review performed; medication list updated in electronic medical record.   Weight Loss:  (Status: Goal on track: NO.) Long term Goal-  [facility] would [facility] to [facility] Reviewed DM metabolic deficits including decreased satiety signaling to brain from intestines and newer medication Tirzepatide used to treat DM/ lipid/BP . Encouraged patient to discuss referral to [place] to assist her with weight management and glycemic control when she sees her primary care provider  Patient Goals/Self-Care Activities: Take medications as prescribed   Attend all scheduled provider appointments Call pharmacy for medication refills 3-7 days in advance of running out of medications Perform all self care activities independently  Perform IADL's (shopping, preparing meals, housekeeping, managing finances) independently Call provider office for new concerns or questions  set goal weight fill  half of plate with vegetables manage portion size switch to sugar-free drinks - call for medicine refill 2 or 3 days before it runs out - take all medications exactly as prescribed - call doctor with any symptoms you believe are related  to your medicine - call doctor when you experience any new symptoms - go to all doctor appointments as scheduled - adhere to prescribed diet: CHO controlled - discuss [facility] with primary care provider Patient to follow up on test strip Patient to contact pharmacy/PCP/GYN regarding medications

## 2022-08-25 ENCOUNTER — Encounter: Payer: Self-pay | Admitting: Internal Medicine

## 2022-08-25 ENCOUNTER — Other Ambulatory Visit (HOSPITAL_BASED_OUTPATIENT_CLINIC_OR_DEPARTMENT_OTHER): Payer: Self-pay

## 2022-08-25 ENCOUNTER — Ambulatory Visit (INDEPENDENT_AMBULATORY_CARE_PROVIDER_SITE_OTHER): Payer: Medicaid Other | Admitting: Internal Medicine

## 2022-08-25 VITALS — BP 124/79 | Temp 98.7°F | Wt 241.2 lb

## 2022-08-25 DIAGNOSIS — E785 Hyperlipidemia, unspecified: Secondary | ICD-10-CM

## 2022-08-25 DIAGNOSIS — E1169 Type 2 diabetes mellitus with other specified complication: Secondary | ICD-10-CM

## 2022-08-25 DIAGNOSIS — E1159 Type 2 diabetes mellitus with other circulatory complications: Secondary | ICD-10-CM | POA: Diagnosis not present

## 2022-08-25 DIAGNOSIS — B3731 Acute candidiasis of vulva and vagina: Secondary | ICD-10-CM | POA: Diagnosis not present

## 2022-08-25 DIAGNOSIS — I152 Hypertension secondary to endocrine disorders: Secondary | ICD-10-CM | POA: Diagnosis not present

## 2022-08-25 DIAGNOSIS — Z7985 Long-term (current) use of injectable non-insulin antidiabetic drugs: Secondary | ICD-10-CM | POA: Diagnosis not present

## 2022-08-25 DIAGNOSIS — Z91199 Patient's noncompliance with other medical treatment and regimen due to unspecified reason: Secondary | ICD-10-CM | POA: Diagnosis not present

## 2022-08-25 DIAGNOSIS — E1121 Type 2 diabetes mellitus with diabetic nephropathy: Secondary | ICD-10-CM | POA: Diagnosis not present

## 2022-08-25 LAB — POCT GLYCOSYLATED HEMOGLOBIN (HGB A1C): Hemoglobin A1C: 12.2 % — AB (ref 4.0–5.6)

## 2022-08-25 MED ORDER — OZEMPIC (0.25 OR 0.5 MG/DOSE) 2 MG/3ML ~~LOC~~ SOPN
0.5000 mg | PEN_INJECTOR | SUBCUTANEOUS | 2 refills | Status: DC
Start: 2022-08-25 — End: 2022-11-11
  Filled 2022-08-25: qty 3, 28d supply, fill #0
  Filled 2022-09-28: qty 3, 28d supply, fill #1
  Filled 2022-10-22: qty 3, 28d supply, fill #2

## 2022-08-25 MED ORDER — FLUCONAZOLE 150 MG PO TABS
150.0000 mg | ORAL_TABLET | Freq: Once | ORAL | 0 refills | Status: AC
Start: 2022-08-25 — End: 2022-08-26
  Filled 2022-08-25: qty 1, 1d supply, fill #0

## 2022-08-25 NOTE — Assessment & Plan Note (Signed)
Main issue impeding healthcare. CM/SW on board. Currently not on amlodipine or ozempic.

## 2022-08-25 NOTE — Assessment & Plan Note (Addendum)
Very uncontrolled with an A1c of 12.2, greatly due to medication non-adherence. Insurance did not cover mounjaro so was switch to ozempic but she never took. Referral to endocrinology today for assistance.

## 2022-08-25 NOTE — Assessment & Plan Note (Signed)
LDL 72 on atorvastatin 80 mg daily.

## 2022-08-25 NOTE — Assessment & Plan Note (Signed)
BP well controlled today. Not taking amlodipine.

## 2022-08-25 NOTE — Assessment & Plan Note (Signed)
Discussed healthy lifestyle, including increased physical activity and better food choices to promote weight loss.  

## 2022-08-25 NOTE — Progress Notes (Signed)
Established Patient Office Visit     CC/Reason for Visit: Follow-up chronic conditions  HPI: Angelica Berg is a 47 y.o. female who is coming in today for the above mentioned reasons. Past Medical History is significant for: Hypertension, hyperlipidemia, type 2 diabetes, obesity.  Perhaps her greatest issue is medication nonadherence.  She has not been on Ozempic or amlodipine.  She is being followed by care coordination.  She again has another yeast infection, these are undoubtably caused by elevated blood glucose.   Past Medical/Surgical History: Past Medical History:  Diagnosis Date   Diabetes mellitus (HCC)    Dyslipidemia     Past Surgical History:  Procedure Laterality Date   CESAREAN SECTION     KNEE ARTHROCENTESIS      Social History:  reports that she quit smoking about 4 years ago. Her smoking use included cigarettes. She has never used smokeless tobacco. She reports current alcohol use. She reports current drug use. Frequency: 7.00 times per week. Drug: Marijuana.  Allergies: No Known Allergies  Family History:  Family History  Problem Relation Age of Onset   Diabetes Mother    Hypertension Mother    Cancer Maternal Grandmother        throat   Heart disease Maternal Grandmother      Current Outpatient Medications:    atorvastatin (LIPITOR) 80 MG tablet, TAKE 1 TABLET BY MOUTH DAILY *NO FURTHER REFILLS UNTIL APPOINTMENT, PER MD*, Disp: 90 tablet, Rfl: 0   Blood Glucose Monitoring Suppl (ACCU-CHEK GUIDE) w/Device KIT, 1 Device by Does not apply route daily. E11.9, Disp: 1 kit, Rfl: 0   Blood Glucose Monitoring Suppl (ONETOUCH VERIO) w/Device KIT, 1 Device by Does not apply route as directed., Disp: 1 kit, Rfl: 0   estradiol (ESTRACE) 0.1 MG/GM vaginal cream, Insert one gram into vagina twice weekly., Disp: 42.5 g, Rfl: 2   fluconazole (DIFLUCAN) 150 MG tablet, Take 1 tablet (150 mg total) by mouth once for 1 dose., Disp: 1 tablet, Rfl: 0   glucose  blood (ACCU-CHEK GUIDE) test strip, Use as instructed to test blood sugar 2 times daily E11.9, Disp: 100 each, Rfl: 12   ibuprofen (ADVIL) 800 MG tablet, Take 1 tablet (800 mg total) by mouth every 8 (eight) hours as needed., Disp: 90 tablet, Rfl: 2   lisinopril (ZESTRIL) 5 MG tablet, TAKE 1 TABLET BY MOUTH DAILY, Disp: 90 tablet, Rfl: 1   metFORMIN (GLUCOPHAGE) 1000 MG tablet, TAKE 1 TABLET BY MOUTH AT BEDTIME, Disp: 90 tablet, Rfl: 1   amLODipine (NORVASC) 5 MG tablet, Take 1 tablet (5 mg total) by mouth daily. (Patient not taking: Reported on 07/15/2022), Disp: 90 tablet, Rfl: 1   Semaglutide,0.25 or 0.5MG /DOS, (OZEMPIC, 0.25 OR 0.5 MG/DOSE,) 2 MG/3ML SOPN, Inject 0.5 mg into the skin every 7 (seven) weeks., Disp: 3 mL, Rfl: 2  Review of Systems:  Negative unless indicated in HPI.   Physical Exam: Vitals:   08/25/22 0909  BP: 124/79  Temp: 98.7 F (37.1 C)  TempSrc: Oral  Weight: 241 lb 3.2 oz (109.4 kg)    Body mass index is 35.62 kg/m.   Physical Exam Vitals reviewed.  Constitutional:      Appearance: Normal appearance.  HENT:     Head: Normocephalic and atraumatic.  Eyes:     Conjunctiva/sclera: Conjunctivae normal.     Pupils: Pupils are equal, round, and reactive to light.  Cardiovascular:     Rate and Rhythm: Normal rate and regular rhythm.  Pulmonary:     Effort: Pulmonary effort is normal.     Breath sounds: Normal breath sounds.  Skin:    General: Skin is warm and dry.  Neurological:     General: No focal deficit present.     Mental Status: She is alert and oriented to person, place, and time.  Psychiatric:        Mood and Affect: Mood normal.        Behavior: Behavior normal.        Thought Content: Thought content normal.        Judgment: Judgment normal.      Impression and Plan:  Type 2 diabetes mellitus with diabetic nephropathy, without long-term current use of insulin (HCC) Assessment & Plan: Very uncontrolled with an A1c of 12.2, greatly due  to medication non-adherence. Insurance did not cover mounjaro so was switch to ozempic but she never took. Referral to endocrinology today for assistance.  Orders: -     POCT glycosylated hemoglobin (Hb A1C) -     Ozempic (0.25 or 0.5 MG/DOSE); Inject 0.5 mg into the skin every 7 (seven) weeks.  Dispense: 3 mL; Refill: 2 -     Ambulatory referral to Endocrinology  Hypertension associated with diabetes (HCC) Assessment & Plan: BP well controlled today. Not taking amlodipine.   Hyperlipidemia associated with type 2 diabetes mellitus (HCC) Assessment & Plan: LDL 72 on atorvastatin 80 mg daily.   Morbid obesity (HCC) Assessment & Plan: -Discussed healthy lifestyle, including increased physical activity and better food choices to promote weight loss.    Non-adherence to medical treatment Assessment & Plan: Main issue impeding healthcare. CM/SW on board. Currently not on amlodipine or ozempic.   Vaginal yeast infection -     Fluconazole; Take 1 tablet (150 mg total) by mouth once for 1 dose.  Dispense: 1 tablet; Refill: 0     Time spent:31 minutes reviewing chart, interviewing and examining patient and formulating plan of care.     Chaya Jan, MD [facility]

## 2022-09-11 ENCOUNTER — Telehealth: Payer: Self-pay | Admitting: *Deleted

## 2022-09-11 NOTE — Telephone Encounter (Signed)
Patient left message on voicemail requesting RX for Diflucan.   Call returned to patient for additional information, no answer, voicemail full.   Routing to [employer] Triage

## 2022-09-16 ENCOUNTER — Encounter: Payer: Self-pay | Admitting: Obstetrics and Gynecology

## 2022-09-16 ENCOUNTER — Other Ambulatory Visit: Payer: Medicaid Other | Admitting: Obstetrics and Gynecology

## 2022-09-16 NOTE — Patient Outreach (Signed)
Medicaid Managed Care   Nurse Care Manager Note  09/16/2022 Name:  Angelica Berg MRN:  [id number] DOB:  09/23/1975  Angelica Berg is an 47 y.o. year old female who is a primary patient of Angelica Berg, Angelica Patricia, MD.  [facility] Managed Care Coordination team was consulted for assistance with:    Chronic healthcare management needs, HLD, HTN, DM  [name] was given information about Medicaid Managed Care Coordination team services today. Angelica Berg Patient agreed to services and verbal consent obtained.  Engaged with patient by telephone for follow up visit in response to provider referral for case management and/or care coordination services.   Assessments/Interventions:  Review of past medical history, allergies, medications, health status, including review of consultants reports, laboratory and other test data, was performed as part of comprehensive evaluation and provision of chronic care management services.  SDOH (Social Determinants of Health) assessments and interventions performed: SDOH Interventions    Flowsheet Row Patient Outreach Telephone from 09/16/2022 in [place] POPULATION HEALTH DEPARTMENT Patient Outreach Telephone from 08/17/2022 in [place] POPULATION HEALTH DEPARTMENT Patient Outreach Telephone from 07/15/2022 in [place] POPULATION HEALTH DEPARTMENT Patient Outreach Telephone from 05/04/2022 in [place] POPULATION HEALTH DEPARTMENT Patient Outreach Telephone from 03/30/2022 in [place] POPULATION HEALTH DEPARTMENT Patient Outreach Telephone from 02/03/2022 in [facility] Community Care Coordination  SDOH Interventions        Housing Interventions -- -- -- Intervention Not Indicated -- --  Transportation Interventions Intervention Not Indicated -- -- -- -- --  Utilities Interventions -- -- Intervention Not Indicated -- -- Intervention Not Indicated  Alcohol Usage Interventions -- -- Intervention Not Indicated (Score <7) -- --  Intervention Not Indicated (Score <7)  Physical Activity Interventions -- Patient Refused -- -- Intervention Not Indicated --  Stress Interventions -- Intervention Not Indicated -- -- Patient Refused  [declines SW services] --  Social Connections Interventions -- -- -- Intervention Not Indicated, Other (Comment)  [patient works 7 days a week right now] -- --     Care Plan  No Known Allergies  Medications Reviewed Today     Reviewed by [name], RN (Registered Nurse) on 09/16/22 at [phone]  Med List Status: <None>   Medication Order Taking? Sig Documenting Provider Last Dose Status Informant  amLODipine (NORVASC) 5 MG tablet [id number] No Take 1 tablet (5 mg total) by mouth daily.  Patient not taking: Reported on 07/15/2022   Angelica Berg, Angelica Patricia, MD Not Taking Active   atorvastatin (LIPITOR) 80 MG tablet [id number] No TAKE 1 TABLET BY MOUTH DAILY *NO FURTHER REFILLS Gaye Alken, PER MD* Angelica Berg, Angelica Patricia, MD Taking Active   Blood Glucose Monitoring Suppl (ACCU-CHEK GUIDE) w/Device KIT [id number] No 1 Device by Does not apply route daily. E11.9 [name], Konrad Dolores, MD Taking Active            Med Note Clearance Coots, Mel Almond Aug 04, 2021 11:03 AM)    Blood Glucose Monitoring Suppl [facility] VERIO) w/Device KIT [id number] No 1 Device by Does not apply route as directed. [name], Konrad Dolores, MD Taking Active   estradiol (ESTRACE) 0.1 MG/GM vaginal cream [id number] No Insert one gram into vagina twice weekly. [name], MD Taking Active   glucose blood (ACCU-CHEK GUIDE) test strip [id number] No Use as instructed to test blood sugar 2 times daily E11.9 Angelica Berg, Angelica Patricia, MD Taking Active   ibuprofen (ADVIL) 800 MG tablet [id number] No Take 1 tablet (  800 mg total) by mouth every 8 (eight) hours as needed. Rodolph Bong, MD Taking Active   lisinopril (ZESTRIL) 5 MG tablet [id number] No TAKE 1 TABLET BY MOUTH DAILY Angelica Berg, Angelica Patricia, MD  Taking Active   metFORMIN (GLUCOPHAGE) 1000 MG tablet [id number] No TAKE 1 TABLET BY MOUTH AT BEDTIME Angelica Berg, Angelica Patricia, MD Taking Active   Semaglutide,0.25 or 0.5MG /DOS, (OZEMPIC, 0.25 OR 0.5 MG/DOSE,) 2 MG/3ML SOPN [id number]  Inject 0.5 mg into the skin every 7 (seven) weeks. Angelica Berg, Angelica Patricia, MD  Active            Patient Active Problem List   Diagnosis Date Noted   Non-adherence to medical treatment 08/25/2022   Hypertension associated with diabetes (HCC) 02/25/2022   Pain in left foot 12/31/2021   Pain in left knee 05/23/2019   Type 2 diabetes mellitus with diabetic nephropathy, without long-term current use of insulin (HCC) 08/30/2018   Morbid obesity (HCC) 08/30/2018   Vitamin D deficiency 08/30/2018   Hyperlipidemia associated with type 2 diabetes mellitus (HCC) 08/30/2018   Transaminitis 08/30/2018   Conditions to be addressed/monitored per PCP order:  Chronic healthcare management needs, HLD, HTN, DM  Care Plan : RN Care Manager Plan Of Care  Updates made by [name], RN since 09/16/2022 12:00 AM     Problem: Knowledge Deficit and Care Coordination Needs Related to Management of  DM, obesity and HLD   Priority: High     Long-Range Goal: Development of Plan Of Care to Address Care Coordination Needs and Knowledge Deficits for Management of DM, obesity, HLD   Start Date: 04/22/2021  Expected End Date: 12/17/2022  Priority: High  Note:   Current Barriers:  Knowledge Deficits related to plan of care for management of HLD, DMII, HTN and Obesity  09/16/22:  patient does not check BP or take blood pressure medication.  Blood sugar  300s-checks occasionally.  Recurrent yeast infections.  ENDO referral placed by PCP 5/7-patient to follow up.  RNCM Clinical Goal(s):  Patient will verbalize basic understanding of HLD, DMII, and obesity disease process and self health management plan as evidenced by improved Hgb A1C, weight loss or no weight gain, normal  lipid panel take all medications exactly as prescribed and will call provider for medication related questions as evidenced by patient reporting improvement in taken medications as prescribed    demonstrate understanding of rationale for each prescribed medication as evidenced by patient able to report to provider purpose of medication    attend all scheduled medical appointments: with primary care provider     demonstrate improved adherence to prescribed treatment plan for HLD, DMII, and Obesity as evidenced by improved Hgb A1C, weight loss of no weight gain, improvement in lipid panel continue to work with Medical illustrator and/or Social Worker to address care management and care coordination needs related to HLD, DMII, and Obesity as evidenced by adherence to CM Team Scheduled appointments  through collaboration with RN Care manager, provider, and care team.  Work with pharmacist to address inconsistent medication taking behavior and personal feeling regarding medications. i.e. "I don't like taking so many medications" and appropriateness of adding Tirzepatide to medication regimen in place of Trulicity  Interventions: Inter-disciplinary care team collaboration (see longitudinal plan of care) Evaluation of current treatment plan related to  self management and patient's adherence to plan as established by provider Care guide referral for dental resources-completed. Collaborated with Care Guide for resources. Encouraged patient to take all  medications  Hypertension Interventions:  (Status:  New goal.) Long Term Goal Last practice recorded BP readings:  BP Readings from Last 3 Encounters:  05/27/22 (!) 150/84  02/25/22 (!) 136/94  12/29/21 (!) 130/90  O5/07/24             124/79  Most recent eGFR/CrCl: No results found for: "EGFR"  No components found for: "CRCL"  Evaluation of current treatment plan related to hypertension self management and patient's adherence to plan as established by  provider Reviewed medications with patient and discussed importance of compliance Discussed plans with patient for ongoing care management follow up and provided patient with direct contact information for care management team Advised patient, providing education and rationale, to monitor blood pressure daily and record, calling PCP for findings outside established parameters Reviewed scheduled/upcoming provider appointments  Assessed social determinant of health barriers   Interdisciplinary Collaboration Interventions:  (Status: Goal on track:  NO.) Long Term Goal  - Collaborated with BSW to initiate plan of care to address needs related to Financial constraints related to limited income, Limited access to food, and Lacks knowledge of community resource: to address food and financial insecurity and dental resources  in patient with HLD, DMII, and Obesity Collaboration with Angelica Berg, Angelica Patricia, MD regarding development and update of comprehensive plan of care as evidenced by provider attestation and co-signature Inter-disciplinary care team collaboration   Referral placed with the pharmacist to address medication taking behavior and patient's personal feelings of "I don't like taking so many medications"  Schedule initial telephone appointment with Managed Medicaid BSW [name] to address financial insecurities and patient's inability to pay rent Referral sent to community care guide requesting resources provided in January related to financial insecurity and utilities be resent to patient   Diabetes:  (Status: Goal on track: NO.) Long Term Goal - patient states she is taking Trulicity as prescribed and remains interested in referral to [facility]  Lab Results  Component Value Date   HGBA1C 8.4 (A) 02/25/2021       HGBA1C                                                      12.6                                   02/25/2022      HGBA1C                                                       10.8                                  05/27/22      HGBA1C  12.2                                  08/25/22 Provided education to patient about basic DM disease process; Reviewed medications with patient and discussed importance of medication adherence;        Reviewed prescribed diet with patient CHO controlled; Discussed plans with patient for ongoing care management follow up and provided patient with direct contact information for care management team;            Hyperlipidemia:  (Status: Condition stable. Not addressed this visit.) Long Term Goal  Lab Results  Component Value Date   CHOL 113 08/21/2020   HDL 44.70 08/21/2020   LDLCALC 52 08/21/2020   TRIG 80.0 08/21/2020   CHOLHDL 3 08/21/2020    Medication review performed; medication list updated in electronic medical record.   Weight Loss:  (Status: Goal on track: NO.) Long term Goal-  Patient would like referral to [facility] Reviewed DM metabolic deficits including decreased satiety signaling to brain from intestines and newer medication Tirzepatide used to treat DM/ lipid/BP . Encouraged patient to discuss referral to [facility] to assist her with weight management and glycemic control when she sees her primary care provider  Patient Goals/Self-Care Activities: Take medications as prescribed   Attend all scheduled provider appointments Call pharmacy for medication refills 3-7 days in advance of running out of medications Perform all self care activities independently  Perform IADL's (shopping, preparing meals, housekeeping, managing finances) independently Call provider office for new concerns or questions  set goal weight fill half of plate with vegetables manage portion size switch to sugar-free drinks - call for medicine refill 2 or 3 days before it runs out - take all  medications exactly as prescribed - call doctor with any symptoms you believe are related to your medicine - call doctor when you experience any new symptoms - go to all doctor appointments as scheduled - adhere to prescribed diet: CHO controlled - discuss referral [facility] with primary care provider Patient to follow up on test strip Patient to contact pharmacy/PCP/GYN regarding medications Patient to call PCP office to f/u on ENDO referral placed 08/25/22   Long-Range Goal: Establish Plan of Care for Chronic Disease Management Needs   Priority: High  Note:   Timeframe:  Long-Range Goal Priority:  High Start Date:     08/18/21                        Expected End Date: ongoing                      Follow Up Date: 10/19/22  - practice safe sex - schedule appointment for flu shot - schedule appointment for vaccines needed due to my age or health - schedule recommended health tests (blood work, mammogram, colonoscopy, pap test) - schedule and keep appointment for annual check-up    Why is this important?   Screening tests can find diseases early when they are easier to treat.  Your doctor or nurse will talk with you about which tests are important for you.  Getting shots for common diseases like the flu and shingles will help prevent them.   09/16/22:  Seen and evaluated by PCP 5/7-ENDO referral placed-patient to f/u   Follow Up:  Patient agrees  to Care Plan and Follow-up.  Plan: The Managed Medicaid care management team will reach out to the patient again over the next 30 business  days. and The  Patient has been provided with contact information for the Managed Medicaid care management team and has been advised to call with any health related questions or concerns.  Date/time of next scheduled RN care management/care coordination outreach: 10/19/22 at 1030.

## 2022-09-16 NOTE — Patient Instructions (Signed)
Hi Angelica Berg for speaking to me this morning-don't forget to check on the Endocrinology referral.  Have a great day!!  Angelica Berg was given information about Medicaid Managed Care team care coordination services as a part of their [facility] Community Plan Medicaid benefit. Angelica Berg verbally consented to engagement with the [facility] Managed Care team.   If you are experiencing a medical emergency, please call 911 or report to your local emergency department or urgent care.   If you have a non-emergency medical problem during routine business hours, please contact your provider's office and ask to speak with a nurse.   For questions related to your [facility], please call: [phone] or visit the homepage here: [email or web address]  If you would like to schedule transportation through your [facility], please call the following number at least 2 days in advance of your appointment: [phone]   Rides for urgent appointments can also be made after hours by calling Member Services.  Call the Behavioral Health Crisis Line at [phone], at any time, 24 hours a day, 7 days a week. If you are in danger or need immediate medical attention call 911.  If you would like help to quit smoking, call 1-800-QUIT-NOW ((819)579-1088) OR Espaol: 1-855-Djelo-Ya (1-324-401-0272) o para ms informacin haga clic aqu or Text READY to 536-644 to register via text  Angelica Berg - following are the goals we discussed in your visit today:   Goals Addressed    Timeframe:  Long-Range Goal Priority:  High Start Date:     08/18/21                        Expected End Date: ongoing                      Follow Up Date: 10/19/22  - practice safe sex - schedule appointment for flu shot - schedule appointment for vaccines needed due to my age or health - schedule recommended health tests (blood  work, mammogram, colonoscopy, pap test) - schedule and keep appointment for annual check-up    Why is this important?   Screening tests can find diseases early when they are easier to treat.  Your doctor or nurse will talk with you about which tests are important for you.  Getting shots for common diseases like the flu and shingles will help prevent them.   09/16/22:  Seen and evaluated by PCP 5/7-ENDO referral placed-patient to f/u  Patient verbalizes understanding of instructions and care plan provided today and agrees to view in MyChart. Active MyChart status and patient understanding of how to access instructions and care plan via MyChart confirmed with patient.     The Managed Medicaid care management team will reach out to the patient again over the next 30 business  days.  The  Patient  has been provided with contact information for the Managed Medicaid care management team and has been advised to call with any health related questions or concerns.   [name] RN, BSN [place]  [facility] Care Management Coordinator - Managed Medicaid High Risk [phone]   Following is a copy of your plan of care:  Care Plan : RN Care Manager Plan Of Care  Updates made by [name], RN since 09/16/2022 12:00 AM     Problem: Knowledge Deficit and Care Coordination Needs Related to Management of  DM, obesity and HLD  Priority: High     Long-Range Goal: Development of Plan Of Care to Address Care Coordination Needs and Knowledge Deficits for Management of DM, obesity, HLD   Start Date: 04/22/2021  Expected End Date: 12/17/2022  Priority: High  Note:   Current Barriers:  Knowledge Deficits related to plan of care for management of HLD, DMII, HTN and Obesity  09/16/22:  patient does not check BP or take blood pressure medication.  Blood sugar  300s-checks occasionally.  Recurrent yeast infections.  ENDO referral placed by PCP 5/7-patient to follow up.  RNCM Clinical  Goal(s):  Patient will verbalize basic understanding of HLD, DMII, and obesity disease process and self health management plan as evidenced by improved Hgb A1C, weight loss or no weight gain, normal lipid panel take all medications exactly as prescribed and will call provider for medication related questions as evidenced by patient reporting improvement in taken medications as prescribed    demonstrate understanding of rationale for each prescribed medication as evidenced by patient able to report to provider purpose of medication    attend all scheduled medical appointments: with primary care provider     demonstrate improved adherence to prescribed treatment plan for HLD, DMII, and Obesity as evidenced by improved Hgb A1C, weight loss of no weight gain, improvement in lipid panel continue to work with Medical illustrator and/or Social Worker to address care management and care coordination needs related to HLD, DMII, and Obesity as evidenced by adherence to CM Team Scheduled appointments  through collaboration with RN Care manager, provider, and care team.  Work with pharmacist to address inconsistent medication taking behavior and personal feeling regarding medications. i.e. "I don't like taking so many medications" and appropriateness of adding Tirzepatide to medication regimen in place of Trulicity  Interventions: Inter-disciplinary care team collaboration (see longitudinal plan of care) Evaluation of current treatment plan related to  self management and patient's adherence to plan as established by provider Care guide referral for dental resources-completed. Collaborated with Care Guide for resources. Encouraged patient to take all medications  Hypertension Interventions:  (Status:  New goal.) Long Term Goal Last practice recorded BP readings:  BP Readings from Last 3 Encounters:  05/27/22 (!) 150/84  02/25/22 (!) 136/94  12/29/21 (!) 130/90  O5/07/24             124/79  Most recent  eGFR/CrCl: No results found for: "EGFR"  No components found for: "CRCL"  Evaluation of current treatment plan related to hypertension self management and patient's adherence to plan as established by provider Reviewed medications with patient and discussed importance of compliance Discussed plans with patient for ongoing care management follow up and provided patient with direct contact information for care management team Advised patient, providing education and rationale, to monitor blood pressure daily and record, calling PCP for findings outside established parameters Reviewed scheduled/upcoming provider appointments  Assessed social determinant of health barriers   Interdisciplinary Collaboration Interventions:  (Status: Goal on track:  NO.) Long Term Goal  - Collaborated with BSW to initiate plan of care to address needs related to Financial constraints related to limited income, Limited access to food, and Lacks knowledge of community resource: to address food and financial insecurity and dental resources  in patient with HLD, DMII, and Obesity Collaboration with Philip Aspen, Limmie Patricia, MD regarding development and update of comprehensive plan of care as evidenced by provider attestation and co-signature Inter-disciplinary care team collaboration   Referral placed with the pharmacist to address medication taking  behavior and patient's personal feelings of "I don't like taking so many medications"  Schedule initial telephone appointment with Managed Medicaid BSW [name] to address financial insecurities and patient's inability to pay rent Referral sent to community care guide requesting resources provided in January related to financial insecurity and utilities be resent to patient   Diabetes:  (Status: Goal on track: NO.) Long Term Goal - patient states she is taking Trulicity as prescribed and remains interested in referral to the [facility]  Lab Results   Component Value Date   HGBA1C 8.4 (A) 02/25/2021       HGBA1C                                                      12.6                                   02/25/2022      HGBA1C                                                      10.8                                  05/27/22      HGBA1C                                                      12.2                                  08/25/22 Provided education to patient about basic DM disease process; Reviewed medications with patient and discussed importance of medication adherence;        Reviewed prescribed diet with patient CHO controlled; Discussed plans with patient for ongoing care management follow up and provided patient with direct contact information for care management team;            Hyperlipidemia:  (Status: Condition stable. Not addressed this visit.) Long Term Goal  Lab Results  Component Value Date   CHOL 113 08/21/2020   HDL 44.70 08/21/2020   LDLCALC 52 08/21/2020   TRIG 80.0 08/21/2020   CHOLHDL 3 08/21/2020    Medication review performed; medication list updated in electronic medical record.   Weight Loss:  (Status: Goal on track: NO.) Long term Goal-  Patient would like referral to [facility] Reviewed DM metabolic deficits including decreased satiety signaling to brain from intestines and newer medication Tirzepatide used to treat DM/ lipid/BP . Encouraged patient to discuss referral to [facility] to assist her with weight management and glycemic control when she sees her primary care provider  Patient Goals/Self-Care Activities: Take medications as prescribed   Attend all scheduled provider appointments Call pharmacy  for medication refills 3-7 days in advance of running out of medications Perform all self care activities independently  Perform IADL's (shopping, preparing meals, housekeeping, managing finances) independently Call provider office for new  concerns or questions  set goal weight fill half of plate with vegetables manage portion size switch to sugar-free drinks - call for medicine refill 2 or 3 days before it runs out - take all medications exactly as prescribed - call doctor with any symptoms you believe are related to your medicine - call doctor when you experience any new symptoms - go to all doctor appointments as scheduled - adhere to prescribed diet: CHO controlled - discuss referral to [facility] with primary care provider Patient to follow up on test strip Patient to contact pharmacy/PCP/GYN regarding medications Patient to call PCP office to f/u on ENDO referral placed 08/25/22

## 2022-09-17 ENCOUNTER — Telehealth: Payer: Self-pay

## 2022-09-17 ENCOUNTER — Other Ambulatory Visit (HOSPITAL_COMMUNITY): Payer: Self-pay

## 2022-09-17 NOTE — Telephone Encounter (Signed)
Pharmacy Patient Advocate Encounter  Prior Authorization for Ozempic (0.25 or 0.5 MG/DOSE) 2MG /3ML pen-injectors has been approved by [employer] Medicaid (ins).    PA # [id number] Effective dates: 08/24/2022 through 08/24/2023  Copay is $4.00  Melanee Spry CPhT Rx Patient Advocate [phone]

## 2022-09-29 NOTE — Telephone Encounter (Signed)
Pt did not return call. Will route to provider and close.    

## 2022-09-30 ENCOUNTER — Other Ambulatory Visit: Payer: Self-pay | Admitting: Internal Medicine

## 2022-09-30 DIAGNOSIS — E1169 Type 2 diabetes mellitus with other specified complication: Secondary | ICD-10-CM

## 2022-10-05 DIAGNOSIS — H40013 Open angle with borderline findings, low risk, bilateral: Secondary | ICD-10-CM | POA: Diagnosis not present

## 2022-10-19 ENCOUNTER — Encounter: Payer: Self-pay | Admitting: Obstetrics and Gynecology

## 2022-10-19 ENCOUNTER — Other Ambulatory Visit: Payer: Medicaid Other | Admitting: Obstetrics and Gynecology

## 2022-10-19 NOTE — Patient Outreach (Signed)
Medicaid Managed Care   Nurse Care Manager Note  10/19/2022 Name:  Angelica Berg MRN:  [id number] DOB:  June 25, 1975  Angelica Berg is an 47 y.o. year old female who is a primary patient of Philip Aspen, Limmie Patricia, MD.  The [facility] Managed Care Coordination team was consulted for assistance with:    Chronic healthcare management needs, HLD, HTN, DM  [name] was given information about Medicaid Managed Care Coordination team services today. Angelica Berg Patient agreed to services and verbal consent obtained.  Engaged with patient by telephone for follow up visit in response to provider referral for case management and/or care coordination services.   Assessments/Interventions:  Review of past medical history, allergies, medications, health status, including review of consultants reports, laboratory and other test data, was performed as part of comprehensive evaluation and provision of chronic care management services.  SDOH (Social Determinants of Health) assessments and interventions performed: SDOH Interventions    Flowsheet Row Patient Outreach Telephone from 10/19/2022 in [place] POPULATION HEALTH DEPARTMENT Patient Outreach Telephone from 09/16/2022 in [place] POPULATION HEALTH DEPARTMENT Patient Outreach Telephone from 08/17/2022 in [place] POPULATION HEALTH DEPARTMENT Patient Outreach Telephone from 07/15/2022 in [place] POPULATION HEALTH DEPARTMENT Patient Outreach Telephone from 05/04/2022 in [place] POPULATION HEALTH DEPARTMENT Patient Outreach Telephone from 03/30/2022 in [place] POPULATION HEALTH DEPARTMENT  SDOH Interventions        Food Insecurity Interventions --  [referrals have been placed] -- -- -- -- --  Housing Interventions -- -- -- -- Intervention Not Indicated --  Transportation Interventions -- Intervention Not Indicated -- -- -- --  Utilities Interventions -- -- -- Intervention Not Indicated -- --  Alcohol Usage Interventions -- -- --  Intervention Not Indicated (Score <7) -- --  Financial Strain Interventions Other (Comment)  [patient has been given resources] -- -- -- -- --  Physical Activity Interventions -- -- Patient Refused -- -- Intervention Not Indicated  Stress Interventions -- -- Intervention Not Indicated -- -- Patient Refused  [declines SW services]  Social Connections Interventions -- -- -- -- Intervention Not Indicated, Other (Comment)  [patient works 7 days a week right now] --     Care Plan  No Known Allergies  Medications Reviewed Today     Reviewed by [name], RN (Registered Nurse) on 10/19/22 at 1056  Med List Status: <None>   Medication Order Taking? Sig Documenting Provider Last Dose Status Informant  amLODipine (NORVASC) 5 MG tablet [id number] No Take 1 tablet (5 mg total) by mouth daily.  Patient not taking: Reported on 07/15/2022   Philip Aspen, Limmie Patricia, MD Not Taking Active   atorvastatin (LIPITOR) 80 MG tablet [id number]  Take 1 tablet (80 mg total) by mouth daily. Philip Aspen, Limmie Patricia, MD  Active   Blood Glucose Monitoring Suppl (ACCU-CHEK GUIDE) w/Device KIT [id number] No 1 Device by Does not apply route daily. E11.9 [name], Konrad Dolores, MD Taking Active            Med Note Clearance Coots, Mel Almond Aug 04, 2021 11:03 AM)    Blood Glucose Monitoring Suppl [facility] VERIO) w/Device KIT [id number] No 1 Device by Does not apply route as directed. [name], Konrad Dolores, MD Taking Active   estradiol (ESTRACE) 0.1 MG/GM vaginal cream [id number] No Insert one gram into vagina twice weekly. [name], MD Taking Active   glucose blood (ACCU-CHEK GUIDE) test strip [id number] No Use as instructed to test blood sugar 2 times  daily E11.9 Philip Aspen, Limmie Patricia, MD Taking Active   ibuprofen (ADVIL) 800 MG tablet [id number] No Take 1 tablet (800 mg total) by mouth every 8 (eight) hours as needed. Rodolph Bong, MD Taking Active   lisinopril (ZESTRIL) 5 MG tablet  [id number] No TAKE 1 TABLET BY MOUTH DAILY Philip Aspen, Limmie Patricia, MD Taking Active   metFORMIN (GLUCOPHAGE) 1000 MG tablet [id number] No TAKE 1 TABLET BY MOUTH AT BEDTIME Philip Aspen, Limmie Patricia, MD Taking Active   Semaglutide,0.25 or 0.5MG /DOS, (OZEMPIC, 0.25 OR 0.5 MG/DOSE,) 2 MG/3ML SOPN [id number]  Inject 0.5 mg into the skin every 7 (seven) weeks. Philip Aspen, Limmie Patricia, MD  Active            Patient Active Problem List   Diagnosis Date Noted   Non-adherence to medical treatment 08/25/2022   Hypertension associated with diabetes (HCC) 02/25/2022   Pain in left foot 12/31/2021   Pain in left knee 05/23/2019   Type 2 diabetes mellitus with diabetic nephropathy, without long-term current use of insulin (HCC) 08/30/2018   Morbid obesity (HCC) 08/30/2018   Vitamin D deficiency 08/30/2018   Hyperlipidemia associated with type 2 diabetes mellitus (HCC) 08/30/2018   Transaminitis 08/30/2018   Conditions to be addressed/monitored per PCP order:  Chronic healthcare management needs, HLD, HTN, DM  Care Plan : RN Care Manager Plan Of Care  Updates made by [name], RN since 10/19/2022 12:00 AM     Problem: Knowledge Deficit and Care Coordination Needs Related to Management of  DM, obesity and HLD   Priority: High     Long-Range Goal: Development of Plan Of Care to Address Care Coordination Needs and Knowledge Deficits for Management of DM, obesity, HLD   Start Date: 04/22/2021  Expected End Date: 12/17/2022  Priority: High  Note:   Current Barriers:  Knowledge Deficits related to plan of care for management of HLD, DMII, HTN and Obesity  10/19/22:  Ongoing yeast infections due to consistently elevated blood sugars-checks occasionally-265 today.  Using external creams and Vagisil wipes-discouraged Vagisil wipes as may make things worse.  Encouraged patient to keep ENDO appt 7/24.  C/O right arm, shoulder, and hand pain along with back pain.  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  To follow up with PT.    RNCM Clinical Goal(s):  Patient will verbalize basic understanding of HLD, DMII, and obesity disease process and self health management plan as evidenced by improved Hgb A1C, weight loss or no weight gain, normal lipid panel take all medications exactly as prescribed and will call provider for medication related questions as evidenced by patient reporting improvement in taken medications as prescribed    demonstrate understanding of rationale for each prescribed medication as evidenced by patient able to report to provider purpose of medication    attend all scheduled medical appointments: with primary care provider     demonstrate improved adherence to prescribed treatment plan for HLD, DMII, and Obesity as evidenced by improved Hgb A1C, weight loss of no weight gain, improvement in lipid panel continue to work with Medical illustrator and/or Social Worker to address care management and care coordination needs related to HLD, DMII, and Obesity as evidenced by adherence to CM Team Scheduled appointments  through collaboration with RN Care manager, provider, and care team.  Work with pharmacist to address inconsistent medication taking behavior and personal feeling regarding medications. i.e. "I don't like taking so many  medications" and appropriateness of adding Tirzepatide to medication regimen in place of Trulicity  Interventions: Inter-disciplinary care team collaboration (see longitudinal plan of care) Evaluation of current treatment plan related to  self management and patient's adherence to plan as established by provider Care guide referral for dental resources-completed. Collaborated with Care Guide for resources. Encouraged patient to take all medications and keep ENDO appt.  To follow up with provider for needed referrals.  Hypertension Interventions:  (Status:  New goal.) Long Term Goal Last practice recorded BP  readings:  BP Readings from Last 3 Encounters:  05/27/22 (!) 150/84  02/25/22 (!) 136/94  12/29/21 (!) 130/90  O5/07/24             124/79  10/19/22:  patient does not check BP or take BP medication  Most recent eGFR/CrCl: No results found for: "EGFR"  No components found for: "CRCL"  Evaluation of current treatment plan related to hypertension self management and patient's adherence to plan as established by provider Reviewed medications with patient and discussed importance of compliance Discussed plans with patient for ongoing care management follow up and provided patient with direct contact information for care management team Advised patient, providing education and rationale, to monitor blood pressure daily and record, calling PCP for findings outside established parameters Reviewed scheduled/upcoming provider appointments  Assessed social determinant of health barriers   Interdisciplinary Collaboration Interventions:  (Status: Goal on track:  NO.) Long Term Goal  - Collaborated with BSW to initiate plan of care to address needs related to Financial constraints related to limited income, Limited access to food, and Lacks knowledge of community resource: to address food and financial insecurity and dental resources  in patient with HLD, DMII, and Obesity Collaboration with Philip Aspen, Limmie Patricia, MD regarding development and update of comprehensive plan of care as evidenced by provider attestation and co-signature Inter-disciplinary care team collaboration   Referral placed with the pharmacist to address medication taking behavior and patient's personal feelings of "I don't like taking so many medications"  Schedule initial telephone appointment with Managed Medicaid BSW [name] to address financial insecurities and patient's inability to pay rent Referral sent to community care guide requesting resources provided in January related to financial insecurity and utilities be  resent to patient   Diabetes:  (Status: Goal on track: NO.) Long Term Goal - patient states she is taking Oxempic  as prescribed  Lab Results  Component Value Date   HGBA1C 8.4 (A) 02/25/2021       HGBA1C                                                      12.6                                   02/25/2022      HGBA1C                                                      10.8  05/27/22      HGBA1C                                                      12.2                                  08/25/22  Provided education to patient about basic DM disease process; Reviewed medications with patient and discussed importance of medication adherence;        Reviewed prescribed diet with patient CHO controlled; Discussed plans with patient for ongoing care management follow up and provided patient with direct contact information for care management team;  10/19/22:  Encouraged patient to keep ENDO appt 7/24           Hyperlipidemia:  (Status: Condition stable. Not addressed this visit.) Long Term Goal  Lab Results  Component Value Date   CHOL 113 08/21/2020   HDL 44.70 08/21/2020   LDLCALC 52 08/21/2020   TRIG 80.0 08/21/2020   CHOLHDL 3 08/21/2020    Medication review performed; medication list updated in electronic medical record.   Weight Loss:  (Status: Goal on track: NO.) Long term Goal-  [facility] Reviewed DM metabolic deficits including decreased satiety signaling to brain from intestines and newer medication Tirzepatide used to treat DM/ lipid/BP . Encouraged patient to discuss referral to [facility] to assist her with weight management and glycemic control when she sees her primary care provider  Patient Goals/Self-Care Activities: Take medications as prescribed   Attend all scheduled provider appointments Call pharmacy for medication refills 3-7 days in advance  of running out of medications Perform all self care activities independently  Perform IADL's (shopping, preparing meals, housekeeping, managing finances) independently Call provider office for new concerns or questions  set goal weight fill half of plate with vegetables manage portion size switch to sugar-free drinks - call for medicine refill 2 or 3 days before it runs out - take all medications exactly as prescribed - call doctor with any symptoms you believe are related to your medicine - call doctor when you experience any new symptoms - go to all doctor appointments as scheduled - adhere to prescribed diet: CHO controlled - discuss referral to [facility] with primary care provider Patient to follow up on test strip-completed Patient to contact pharmacy/PCP/GYN regarding medications-completed Patient to call PCP office to f/u on ENDO referral placed 08/25/22-completed    Long-Range Goal: Establish Plan of Care for Chronic Disease Management Needs   Priority: High  Note:   Timeframe:  Long-Range Goal Priority:  High Start Date:     08/18/21                        Expected End Date: ongoing                      Follow Up Date: 11/19/22  - practice safe sex - schedule appointment for flu shot - schedule appointment for vaccines needed due to my age or health - schedule recommended health tests (blood work, mammogram, colonoscopy, pap test) - schedule and keep appointment for annual check-up  Why is this important?   Screening tests can find diseases early when they are easier to treat.  Your doctor or nurse will talk with you about which tests are important for you.  Getting shots for common diseases like the flu and shingles will help prevent them.   10/19/22:  ENDO appt 7/24, PCP 8/7   Follow Up:  Patient agrees to Care Plan and Follow-up.  Plan: The Managed Medicaid care management team will reach out to the patient again over the next 30 business   days. and The  Patient has been provided with contact information for the Managed Medicaid care management team and has been advised to call with any health related questions or concerns.  Date/time of next scheduled RN care management/care coordination outreach:  11/19/22 at 230

## 2022-10-19 NOTE — Patient Instructions (Signed)
Hi Angelica Berg for talking with me.  Please keep the Endocrinologist appointment.    You might want to try some Ibuprofen for the back pain and consider PT as well as check in with your provider.  I think it is probably related to the standing you do everyday with your job.    Angelica Berg was given information about Medicaid Managed Care team care coordination services as a part of their [facility] Community Plan Medicaid benefit. Angelica Berg verbally consented to engagement with the [facility] Managed Care team.   If you are experiencing a medical emergency, please call 911 or report to your local emergency department or urgent care.   If you have a non-emergency medical problem during routine business hours, please contact your provider's office and ask to speak with a nurse.   For questions related to your [facility], please call: [phone] or visit the homepage here: [email or web address]  If you would like to schedule transportation through your [facility], please call the following number at least 2 days in advance of your appointment: [phone]   Rides for urgent appointments can also be made after hours by calling Member Services.  Call the Behavioral Health Crisis Line at [phone], at any time, 24 hours a day, 7 days a week. If you are in danger or need immediate medical attention call 911.  If you would like help to quit smoking, call 1-800-QUIT-NOW (8132323034) OR Espaol: 1-855-Djelo-Ya (1-324-401-0272) o para ms informacin haga clic aqu or Text READY to 536-644 to register via text  Angelica Berg - following are the goals we discussed in your visit today:   Goals Addressed    Timeframe:  Long-Range Goal Priority:  High Start Date:     08/18/21                        Expected End Date: ongoing                      Follow Up Date: 11/19/22  - practice  safe sex - schedule appointment for flu shot - schedule appointment for vaccines needed due to my age or health - schedule recommended health tests (blood work, mammogram, colonoscopy, pap test) - schedule and keep appointment for annual check-up    Why is this important?   Screening tests can find diseases early when they are easier to treat.  Your doctor or nurse will talk with you about which tests are important for you.  Getting shots for common diseases like the flu and shingles will help prevent them.   10/19/22:  ENDO appt 7/24, PCP 8/7  Patient verbalizes understanding of instructions and care plan provided today and agrees to view in MyChart. Active MyChart status and patient understanding of how to access instructions and care plan via MyChart confirmed with patient.     The Managed Medicaid care management team will reach out to the patient again over the next 30 business  days.  The  Patient has been provided with contact information for the Managed Medicaid care management team and has been advised to call with any health related questions or concerns.   [name] RN, BSN [place]  [facility] Care Management Coordinator - Managed Medicaid High Risk [phone]   Following is a copy of your plan of care:  Care Plan : RN Care Manager Plan Of Care  Updates made by  [name], RN since 10/19/2022 12:00 AM     Problem: Knowledge Deficit and Care Coordination Needs Related to Management of  DM, obesity and HLD   Priority: High     Long-Range Goal: Development of Plan Of Care to Address Care Coordination Needs and Knowledge Deficits for Management of DM, obesity, HLD   Start Date: 04/22/2021  Expected End Date: 12/17/2022  Priority: High  Note:   Current Barriers:  Knowledge Deficits related to plan of care for management of HLD, DMII, HTN and Obesity  10/19/22:  Ongoing yeast infections due to consistently elevated blood sugars-checks occasionally-265  today.  Using external creams and Vagisil wipes-discouraged Vagisil wipes as may make things worse.  Encouraged patient to keep ENDO appt 7/24.  C/O right arm, shoulder, and hand pain along with back pain.  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  To follow up with PT.    RNCM Clinical Goal(s):  Patient will verbalize basic understanding of HLD, DMII, and obesity disease process and self health management plan as evidenced by improved Hgb A1C, weight loss or no weight gain, normal lipid panel take all medications exactly as prescribed and will call provider for medication related questions as evidenced by patient reporting improvement in taken medications as prescribed    demonstrate understanding of rationale for each prescribed medication as evidenced by patient able to report to provider purpose of medication    attend all scheduled medical appointments: with primary care provider     demonstrate improved adherence to prescribed treatment plan for HLD, DMII, and Obesity as evidenced by improved Hgb A1C, weight loss of no weight gain, improvement in lipid panel continue to work with Medical illustrator and/or Social Worker to address care management and care coordination needs related to HLD, DMII, and Obesity as evidenced by adherence to CM Team Scheduled appointments  through collaboration with RN Care manager, provider, and care team.  Work with pharmacist to address inconsistent medication taking behavior and personal feeling regarding medications. i.e. "I don't like taking so many medications" and appropriateness of adding Tirzepatide to medication regimen in place of Trulicity  Interventions: Inter-disciplinary care team collaboration (see longitudinal plan of care) Evaluation of current treatment plan related to  self management and patient's adherence to plan as established by provider Care guide referral for dental resources-completed. Collaborated  with Care Guide for resources. Encouraged patient to take all medications and keep ENDO appt.  To follow up with provider for needed referrals.  Hypertension Interventions:  (Status:  New goal.) Long Term Goal Last practice recorded BP readings:  BP Readings from Last 3 Encounters:  05/27/22 (!) 150/84  02/25/22 (!) 136/94  12/29/21 (!) 130/90  O5/07/24             124/79  10/19/22:  patient does not check BP or take BP medication  Most recent eGFR/CrCl: No results found for: "EGFR"  No components found for: "CRCL"  Evaluation of current treatment plan related to hypertension self management and patient's adherence to plan as established by provider Reviewed medications with patient and discussed importance of compliance Discussed plans with patient for ongoing care management follow up and provided patient with direct contact information for care management team Advised patient, providing education and rationale, to monitor blood pressure daily and record, calling PCP for findings outside established parameters Reviewed scheduled/upcoming provider appointments  Assessed social determinant of health barriers   Interdisciplinary Collaboration Interventions:  (Status:  Goal on track:  NO.) Long Term Goal  - Collaborated with BSW to initiate plan of care to address needs related to Financial constraints related to limited income, Limited access to food, and Lacks knowledge of community resource: to address food and financial insecurity and dental resources  in patient with HLD, DMII, and Obesity Collaboration with Philip Aspen, Limmie Patricia, MD regarding development and update of comprehensive plan of care as evidenced by provider attestation and co-signature Inter-disciplinary care team collaboration   Referral placed with the pharmacist to address medication taking behavior and patient's personal feelings of "I don't like taking so many medications"  Schedule initial telephone appointment with  Managed Medicaid BSW [name] to address financial insecurities and patient's inability to pay rent Referral sent to community care guide requesting resources provided in January related to financial insecurity and utilities be resent to patient   Diabetes:  (Status: Goal on track: NO.) Long Term Goal - patient states she is taking Oxempic  as prescribed  Lab Results  Component Value Date   HGBA1C 8.4 (A) 02/25/2021       HGBA1C                                                      12.6                                   02/25/2022      HGBA1C                                                      10.8                                  05/27/22      HGBA1C                                                      12.2                                  08/25/22  Provided education to patient about basic DM disease process; Reviewed medications with patient and discussed importance of medication adherence;        Reviewed prescribed diet with patient CHO controlled; Discussed plans with patient for ongoing care management follow up and provided patient with direct contact information for care management team;  10/19/22:  Encouraged patient to keep ENDO appt 7/24           Hyperlipidemia:  (Status: Condition stable. Not addressed this visit.) Long Term Goal  Lab Results  Component Value Date   CHOL 113 08/21/2020   HDL 44.70 08/21/2020   LDLCALC 52 08/21/2020   TRIG 80.0 08/21/2020   CHOLHDL 3 08/21/2020    Medication review performed; medication list updated in electronic medical  record.   Weight Loss:  (Status: Goal on track: NO.) Long term Goal-  [facility] Reviewed DM metabolic deficits including decreased satiety signaling to brain from intestines and newer medication Tirzepatide used to treat DM/ lipid/BP . Encouraged patient to discuss referral to [facility] to assist her with weight management  and glycemic control when she sees her primary care provider  Patient Goals/Self-Care Activities: Take medications as prescribed   Attend all scheduled provider appointments Call pharmacy for medication refills 3-7 days in advance of running out of medications Perform all self care activities independently  Perform IADL's (shopping, preparing meals, housekeeping, managing finances) independently Call provider office for new concerns or questions  set goal weight fill half of plate with vegetables manage portion size switch to sugar-free drinks - call for medicine refill 2 or 3 days before it runs out - take all medications exactly as prescribed - call doctor with any symptoms you believe are related to your medicine - call doctor when you experience any new symptoms - go to all doctor appointments as scheduled - adhere to prescribed diet: CHO controlled - discuss referral to [facility] with primary care provider Patient to follow up on test strip-completed Patient to contact pharmacy/PCP/GYN regarding medications-completed Patient to call PCP office to f/u on ENDO referral placed 08/25/22-completed

## 2022-10-23 ENCOUNTER — Other Ambulatory Visit: Payer: Self-pay

## 2022-10-23 DIAGNOSIS — E1121 Type 2 diabetes mellitus with diabetic nephropathy: Secondary | ICD-10-CM

## 2022-11-04 ENCOUNTER — Other Ambulatory Visit (INDEPENDENT_AMBULATORY_CARE_PROVIDER_SITE_OTHER): Payer: Medicaid Other

## 2022-11-04 ENCOUNTER — Other Ambulatory Visit: Payer: Self-pay | Admitting: Internal Medicine

## 2022-11-04 DIAGNOSIS — E559 Vitamin D deficiency, unspecified: Secondary | ICD-10-CM | POA: Diagnosis not present

## 2022-11-04 DIAGNOSIS — E1121 Type 2 diabetes mellitus with diabetic nephropathy: Secondary | ICD-10-CM

## 2022-11-04 LAB — COMPREHENSIVE METABOLIC PANEL
ALT: 32 U/L (ref 0–35)
AST: 20 U/L (ref 0–37)
Albumin: 4.3 g/dL (ref 3.5–5.2)
Alkaline Phosphatase: 60 U/L (ref 39–117)
BUN: 7 mg/dL (ref 6–23)
CO2: 27 mEq/L (ref 19–32)
Calcium: 9.2 mg/dL (ref 8.4–10.5)
Chloride: 103 mEq/L (ref 96–112)
Creatinine, Ser: 0.6 mg/dL (ref 0.40–1.20)
GFR: 108.48 mL/min (ref >60.00)
Glucose, Bld: 215 mg/dL — ABNORMAL HIGH (ref 70–99)
Potassium: 3.8 mEq/L (ref 3.5–5.1)
Sodium: 139 mEq/L (ref 135–145)
Total Bilirubin: 1.5 mg/dL — ABNORMAL HIGH (ref 0.2–1.2)
Total Protein: 7.4 g/dL (ref 6.0–8.3)

## 2022-11-04 LAB — LDL CHOLESTEROL, DIRECT: Direct LDL: 58 mg/dL

## 2022-11-04 LAB — MICROALBUMIN / CREATININE URINE RATIO
Creatinine,U: 88.9 mg/dL
Microalb Creat Ratio: 5.5 mg/g (ref 0.0–30.0)
Microalb, Ur: 4.9 mg/dL — ABNORMAL HIGH (ref 0.0–1.9)

## 2022-11-04 LAB — LIPID PANEL
Cholesterol: 133 mg/dL (ref 0–200)
HDL: 49.2 mg/dL (ref >39.00)
NonHDL: 83.81
Total CHOL/HDL Ratio: 3
Triglycerides: 236 mg/dL — ABNORMAL HIGH (ref 0.0–149.0)
VLDL: 47.2 mg/dL — ABNORMAL HIGH (ref 0.0–40.0)

## 2022-11-04 LAB — VITAMIN D 25 HYDROXY (VIT D DEFICIENCY, FRACTURES): VITD: 10.97 ng/mL — ABNORMAL LOW (ref 30.00–100.00)

## 2022-11-04 LAB — HEMOGLOBIN A1C: Hgb A1c MFr Bld: 9.7 % — ABNORMAL HIGH (ref 4.6–6.5)

## 2022-11-04 MED ORDER — VITAMIN D (ERGOCALCIFEROL) 1.25 MG (50000 UNIT) PO CAPS
50000.0000 [IU] | ORAL_CAPSULE | ORAL | 0 refills | Status: AC
Start: 2022-11-04 — End: 2023-01-21

## 2022-11-11 ENCOUNTER — Ambulatory Visit (INDEPENDENT_AMBULATORY_CARE_PROVIDER_SITE_OTHER): Payer: Medicaid Other | Admitting: "Endocrinology

## 2022-11-11 ENCOUNTER — Other Ambulatory Visit: Payer: Self-pay | Admitting: *Deleted

## 2022-11-11 ENCOUNTER — Encounter: Payer: Self-pay | Admitting: "Endocrinology

## 2022-11-11 VITALS — BP 120/75 | HR 85 | Ht 69.0 in | Wt 246.6 lb

## 2022-11-11 DIAGNOSIS — Z7985 Long-term (current) use of injectable non-insulin antidiabetic drugs: Secondary | ICD-10-CM

## 2022-11-11 DIAGNOSIS — Z7984 Long term (current) use of oral hypoglycemic drugs: Secondary | ICD-10-CM

## 2022-11-11 DIAGNOSIS — E7800 Pure hypercholesterolemia, unspecified: Secondary | ICD-10-CM | POA: Diagnosis not present

## 2022-11-11 DIAGNOSIS — E1165 Type 2 diabetes mellitus with hyperglycemia: Secondary | ICD-10-CM

## 2022-11-11 MED ORDER — METFORMIN HCL ER 500 MG PO TB24: 1000.0000 mg | ORAL_TABLET | Freq: Two times a day (BID) | ORAL | 3 refills | Status: DC

## 2022-11-11 MED ORDER — SEMAGLUTIDE (1 MG/DOSE) 4 MG/3ML ~~LOC~~ SOPN: 1.0000 mg | PEN_INJECTOR | SUBCUTANEOUS | 3 refills | Status: DC

## 2022-11-11 NOTE — Progress Notes (Signed)
Outpatient Endocrinology Note Altamese Hubbell, MD  11/11/22   Angelica Berg 05-10-1975 [id number]  Referring Provider: Philip Aspen, Almira Bar* Primary Care Provider: Philip Aspen, Limmie Patricia, MD Reason for consultation: Subjective   Assessment & Plan  Angelica Berg was seen today for diabetes.  Diagnoses and all orders for this visit:  Uncontrolled type 2 diabetes mellitus with hyperglycemia (HCC)  Long term (current) use of oral hypoglycemic drugs  Long-term (current) use of injectable non-insulin antidiabetic drugs  Pure hypercholesterolemia  Other orders -     metFORMIN (GLUCOPHAGE-XR) 500 MG 24 hr tablet; Take 2 tablets (1,000 mg total) by mouth 2 (two) times daily with a meal. -     Semaglutide, 1 MG/DOSE, 4 MG/3ML SOPN; Inject 1 mg as directed once a week.    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, neuropathy  Lab Results  Component Value Date   GFR 108.48 11/04/2022   Hba1c goal less than 7, current Hba1c is  Lab Results  Component Value Date   HGBA1C 9.7 (H) 11/04/2022   Will recommend the following: Metformin XR 500 mg 2 pills twice a day Ozempic 1 mg weekly  No known contraindications to any of above medications  -Last LD and Tg are as follows: Lab Results  Component Value Date   LDLCALC 72 11/25/2021    Lab Results  Component Value Date   TRIG 236.0 (H) 11/04/2022   -On atorvastatin 80 mg QD -Follow low fat diet and exercise   -Blood pressure goal <140/90 - Microalbumin/creatinine goal is < 30 -Last MA/Cr is as follows: Lab Results  Component Value Date   MICROALBUR 4.9 (H) 11/04/2022   -on ACE/ARB lisinopril 5 mg every day  -diet changes including salt restriction -limit eating outside -counseled BP targets per standards of diabetes care -uncontrolled blood pressure can lead to retinopathy, nephropathy and cardiovascular and atherosclerotic heart disease  Reviewed and counseled on: -A1C target -Blood sugar  targets -Complications of uncontrolled diabetes  -Checking blood sugar before meals and bedtime and bring log next visit -All medications with mechanism of action and side effects -Hypoglycemia management: rule of 15's, Glucagon Emergency Kit and medical alert ID -low-carb low-fat plate-method diet -At least 20 minutes of physical activity per day -Annual dilated retinal eye exam and foot exam -compliance and follow up needs -follow up as scheduled or earlier if problem gets worse  Call if blood sugar is less than 70 or consistently above 250    Take a 15 gm snack of carbohydrate at bedtime before you go to sleep if your blood sugar is less than 100.    If you are going to fast after midnight for a test or procedure, ask your physician for instructions on how to reduce/decrease your insulin dose.    Call if blood sugar is less than 70 or consistently above 250  -Treating a low sugar by rule of 15  (15 gms of sugar every 15 min until sugar is more than 70) If you feel your sugar is low, test your sugar to be sure If your sugar is low (less than 70), then take 15 grams of a fast acting Carbohydrate (3-4 glucose tablets or glucose gel or 4 ounces of juice or regular soda) Recheck your sugar 15 min after treating low to make sure it is more than 70 If sugar is still less than 70, treat again with 15 grams of carbohydrate          Don't drive the hour of  hypoglycemia  If unconscious/unable to eat or drink by mouth, use glucagon injection or nasal spray baqsimi and call 911. Can repeat again in 15 min if still unconscious.  Return in about 3 months (around 02/11/2023).   I have reviewed current medications, nurse's notes, allergies, vital signs, past medical and surgical history, family medical history, and social history for this encounter. Counseled patient on symptoms, examination findings, lab findings, imaging results, treatment decisions and monitoring and prognosis. The patient  understood the recommendations and agrees with the treatment plan. All questions regarding treatment plan were fully answered.  Altamese [place], MD  11/11/22    History of Present Illness Angelica Berg is a 47 y.o. year old female who presents for evaluation of Type II diabetes mellitus.  Angelica Berg was first diagnosed in 07/2018.   Diabetes education +  Home diabetes regimen: Metformin 1000 mg every day - has GI S/E Ozempic 0.5 mg weekly  Prior Medications tried/Intolerance: Started on Metformin and Glipizide   COMPLICATIONS -  MI/Stroke -  retinopathy +  neuropathy -  nephropathy  SYMPTOMS REVIEWED - Polyuria - unintentional weight loss - Blurred vision  BLOOD SUGAR DATA Did not bring meter 230-286   Physical Exam  BP 120/75   Pulse 85   Ht 5\' 9"  (1.753 m)   Wt 246 lb 9.6 oz (111.9 kg)   LMP  (LMP Unknown)   SpO2 99%   BMI 36.42 kg/m    Constitutional: well developed, well nourished Head: normocephalic, atraumatic Eyes: sclera anicteric, no redness Neck: supple Lungs: normal respiratory effort Neurology: alert and oriented Skin: dry, no appreciable rashes Musculoskeletal: no appreciable defects Psychiatric: normal mood and affect Diabetic Foot Exam - Simple   Simple Foot Form Diabetic Foot exam was performed with the following findings: Yes 11/11/2022  8:35 AM  Visual Inspection No deformities, no ulcerations, no other skin breakdown bilaterally: Yes Sensation Testing Intact to touch and monofilament testing bilaterally: Yes Pulse Check Posterior Tibialis and Dorsalis pulse intact bilaterally: Yes Comments      Current Medications Patient's Medications  New Prescriptions   METFORMIN (GLUCOPHAGE-XR) 500 MG 24 HR TABLET    Take 2 tablets (1,000 mg total) by mouth 2 (two) times daily with a meal.   SEMAGLUTIDE, 1 MG/DOSE, 4 MG/3ML SOPN    Inject 1 mg as directed once a week.  Previous Medications   AMLODIPINE (NORVASC) 5 MG TABLET    Take 1  tablet (5 mg total) by mouth daily.   ATORVASTATIN (LIPITOR) 80 MG TABLET    Take 1 tablet (80 mg total) by mouth daily.   BLOOD GLUCOSE MONITORING SUPPL (ACCU-CHEK GUIDE) W/DEVICE KIT    1 Device by Does not apply route daily. E11.9   BLOOD GLUCOSE MONITORING SUPPL (ONETOUCH VERIO) W/DEVICE KIT    1 Device by Does not apply route as directed.   ESTRADIOL (ESTRACE) 0.1 MG/GM VAGINAL CREAM    Insert one gram into vagina twice weekly.   GLUCOSE BLOOD (ACCU-CHEK GUIDE) TEST STRIP    Use as instructed to test blood sugar 2 times daily E11.9   IBUPROFEN (ADVIL) 800 MG TABLET    Take 1 tablet (800 mg total) by mouth every 8 (eight) hours as needed.   LISINOPRIL (ZESTRIL) 5 MG TABLET    TAKE 1 TABLET BY MOUTH DAILY   VITAMIN D, ERGOCALCIFEROL, (DRISDOL) 1.25 MG (50000 UNIT) CAPS CAPSULE    Take 1 capsule (50,000 Units total) by mouth every 7 (seven) days for 12  doses.  Modified Medications   No medications on file  Discontinued Medications   METFORMIN (GLUCOPHAGE) 1000 MG TABLET    TAKE 1 TABLET BY MOUTH AT BEDTIME   SEMAGLUTIDE,0.25 OR 0.5MG /DOS, (OZEMPIC, 0.25 OR 0.5 MG/DOSE,) 2 MG/3ML SOPN    Inject 0.5 mg into the skin every 7 (seven) weeks.    Allergies No Known Allergies  Past Medical History Past Medical History:  Diagnosis Date   Diabetes mellitus (HCC)    Dyslipidemia     Past Surgical History Past Surgical History:  Procedure Laterality Date   CESAREAN SECTION     KNEE ARTHROCENTESIS      Family History family history includes Cancer in her maternal grandmother; Diabetes in her mother; Heart disease in her maternal grandmother; Hypertension in her mother.  Social History Social History   Socioeconomic History   Marital status: Single    Spouse name: Not on file   Number of children: Not on file   Years of education: Not on file   Highest education level: Not on file  Occupational History   Not on file  Tobacco Use   Smoking status: Former    Current packs/day: 0.00     Types: Cigarettes    Quit date: 06/12/2018    Years since quitting: 4.4   Smokeless tobacco: Never  Vaping Use   Vaping status: Never Used  Substance and Sexual Activity   Alcohol use: Yes    Comment: occ   Drug use: Yes    Frequency: 7.0 times per week    Types: Marijuana   Sexual activity: Not Currently    Partners: Male    Birth control/protection: Post-menopausal  Other Topics Concern   Not on file  Social History Narrative   04/22/21- Single mom of 69 year old twin girls   Social Determinants of Health   Financial Resource Strain: Medium Risk (10/19/2022)   Overall Financial Resource Strain (CARDIA)    Difficulty of Paying Living Expenses: Somewhat hard  Food Insecurity: Food Insecurity Present (10/19/2022)   Hunger Vital Sign    Worried About Running Out of Food in the Last Year: Sometimes true    Ran Out of Food in the Last Year: Sometimes true  Transportation Needs: No Transportation Needs (09/16/2022)   PRAPARE - Administrator, Civil Service (Medical): No    Lack of Transportation (Non-Medical): No  Physical Activity: Inactive (08/17/2022)   Exercise Vital Sign    Days of Exercise per Week: 0 days    Minutes of Exercise per Session: 0 min  Stress: No Stress Concern Present (08/17/2022)   [employer] of Occupational Health - Occupational Stress Questionnaire    Feeling of Stress : Only a little  Social Connections: Socially Isolated (05/04/2022)   Social Connection and Isolation Panel [NHANES]    Frequency of Communication with Friends and Family: More than three times a week    Frequency of Social Gatherings with Friends and Family: More than three times a week    Attends Religious Services: Never    Database administrator or Organizations: No    Attends Banker Meetings: Never    Marital Status: Never married  Intimate Partner Violence: Not At Risk (09/16/2022)   Humiliation, Afraid, Rape, and Kick questionnaire    Fear of Current or  Ex-Partner: No    Emotionally Abused: No    Physically Abused: No    Sexually Abused: No    Lab Results  Component Value Date  HGBA1C 9.7 (H) 11/04/2022   HGBA1C 12.2 (A) 08/25/2022   HGBA1C 10.8 (A) 05/27/2022   Lab Results  Component Value Date   CHOL 133 11/04/2022   Lab Results  Component Value Date   HDL 49.20 11/04/2022   Lab Results  Component Value Date   LDLCALC 72 11/25/2021   Lab Results  Component Value Date   TRIG 236.0 (H) 11/04/2022   Lab Results  Component Value Date   CHOLHDL 3 11/04/2022   Lab Results  Component Value Date   CREATININE 0.60 11/04/2022   Lab Results  Component Value Date   GFR 108.48 11/04/2022   Lab Results  Component Value Date   MICROALBUR 4.9 (H) 11/04/2022      Component Value Date/Time   NA 139 11/04/2022 1006   K 3.8 11/04/2022 1006   CL 103 11/04/2022 1006   CO2 27 11/04/2022 1006   GLUCOSE 215 (H) 11/04/2022 1006   GLUCOSE 83 03/15/2008 0535   BUN 7 11/04/2022 1006   CREATININE 0.60 11/04/2022 1006   CREATININE 0.66 02/15/2020 0939   CALCIUM 9.2 11/04/2022 1006   PROT 7.4 11/04/2022 1006   ALBUMIN 4.3 11/04/2022 1006   AST 20 11/04/2022 1006   ALT 32 11/04/2022 1006   ALKPHOS 60 11/04/2022 1006   BILITOT 1.5 (H) 11/04/2022 1006   GFRNONAA >60 04/07/2008 0450   GFRAA  04/07/2008 0450    >60        The eGFR has been calculated using the MDRD equation. This calculation has not been validated in all clinical      Latest Ref Rng & Units 11/04/2022   10:06 AM 11/25/2021    9:29 AM 08/21/2020    9:45 AM  BMP  Glucose 70 - 99 mg/dL 295  621  308   BUN 6 - 23 mg/dL 7  7  7    Creatinine 0.40 - 1.20 mg/dL 6.57  8.46  9.62   Sodium 135 - 145 mEq/L 139  137  140   Potassium 3.5 - 5.1 mEq/L 3.8  3.9  4.3   Chloride 96 - 112 mEq/L 103  98  105   CO2 19 - 32 mEq/L 27  31  28    Calcium 8.4 - 10.5 mg/dL 9.2  9.4  8.7        Component Value Date/Time   WBC 5.0 11/25/2021 0929   RBC 4.45 11/25/2021 0929   HGB  13.7 11/25/2021 0929   HCT 40.1 11/25/2021 0929   PLT 263.0 11/25/2021 0929   MCV 90.1 11/25/2021 0929   MCH 30.1 08/11/2018 1036   MCHC 34.1 11/25/2021 0929   RDW 12.9 11/25/2021 0929   LYMPHSABS 2.3 11/25/2021 0929   MONOABS 0.4 11/25/2021 0929   EOSABS 0.1 11/25/2021 0929   BASOSABS 0.0 11/25/2021 0929     Parts of this note may have been dictated using voice recognition software. There may be variances in spelling and vocabulary which are unintentional. Not all errors are proofread. Please notify the Thereasa Parkin if any discrepancies are noted or if the meaning of any statement is not clear.

## 2022-11-11 NOTE — Patient Instructions (Signed)

## 2022-11-11 NOTE — Telephone Encounter (Signed)
Patient is requesting a refill of  ibuprofen (ADVIL) 800 MG tablet for bone pain and all over body aches.

## 2022-11-12 MED ORDER — IBUPROFEN 800 MG PO TABS: 800.0000 mg | ORAL_TABLET | Freq: Three times a day (TID) | ORAL | 0 refills | Status: DC | PRN

## 2022-11-19 ENCOUNTER — Other Ambulatory Visit: Payer: Medicaid Other | Admitting: Obstetrics and Gynecology

## 2022-11-19 ENCOUNTER — Encounter: Payer: Self-pay | Admitting: Obstetrics and Gynecology

## 2022-11-19 NOTE — Patient Instructions (Signed)
Hi Angelica Berg hope you feel better-have a good afternoon!  Angelica Berg was given information about Medicaid Managed Care team care coordination services as a part of their [facility] Community Plan Medicaid benefit. Angelica Berg verbally consented to engagement with the [facility] Managed Care team.   If you are experiencing a medical emergency, please call 911 or report to your local emergency department or urgent care.   If you have a non-emergency medical problem during routine business hours, please contact your provider's office and ask to speak with a nurse.   For questions related to your [facility], please call: [phone] or visit the homepage here: [email or web address]  If you would like to schedule transportation through your [facility], please call the following number at least 2 days in advance of your appointment: [phone]   Rides for urgent appointments can also be made after hours by calling Member Services.  Call the Behavioral Health Crisis Line at [phone], at any time, 24 hours a day, 7 days a week. If you are in danger or need immediate medical attention call 911.  If you would like help to quit smoking, call 1-800-QUIT-NOW (806-651-8257) OR Espaol: 1-855-Djelo-Ya (1-324-401-0272) o para ms informacin haga clic aqu or Text READY to 536-644 to register via text  Angelica Berg - following are the goals we discussed in your visit today:   Goals Addressed    Timeframe:  Long-Range Goal Priority:  High Start Date:     08/18/21                        Expected End Date: ongoing                      Follow Up Date: 01/19/23  - practice safe sex - schedule appointment for flu shot - schedule appointment for vaccines needed due to my age or health - schedule recommended health tests (blood work, mammogram, colonoscopy, pap test) - schedule and keep  appointment for annual check-up    Why is this important?   Screening tests can find diseases early when they are easier to treat.  Your doctor or nurse will talk with you about which tests are important for you.  Getting shots for common diseases like the flu and shingles will help prevent them.   11/19/22-PCP 8/7  Patient verbalizes understanding of instructions and care plan provided today and agrees to view in MyChart. Active MyChart status and patient understanding of how to access instructions and care plan via MyChart confirmed with patient.     The Managed Medicaid care management team will reach out to the patient again over the next 60 business  days.  The  Patient  has been provided with contact information for the Managed Medicaid care management team and has been advised to call with any health related questions or concerns.   [name] RN, BSN [place]  [facility] Care Management Coordinator - Managed Medicaid High Risk [phone]   Following is a copy of your plan of care:  Care Plan : RN Care Manager Plan Of Care  Updates made by [name], RN since 11/19/2022 12:00 AM     Problem: Knowledge Deficit and Care Coordination Needs Related to Management of  DM, obesity and HLD   Priority: High     Long-Range Goal: Development of Plan Of Care to Address Care Coordination Needs and Knowledge  Deficits for Management of DM, obesity, HLD   Start Date: 04/22/2021  Expected End Date: 02/19/2023  Priority: High  Note:   Current Barriers:  Knowledge Deficits related to plan of care for management of HLD, DMII, HTN and Obesity  11/19/22:  Patient seen and evaluated by ENDO 7/24-Ozempic and Metformin increased.  BP stable-120/75 at ENDO appt 7/24-patient does not check.  Blood sugar 214 today. Taking IBP 800 mg for back pain-to f/u with PCP 8/7.    RNCM Clinical Goal(s):  Patient will verbalize basic understanding of HLD, DMII, and obesity disease process and  self health management plan as evidenced by improved Hgb A1C, weight loss or no weight gain, normal lipid panel take all medications exactly as prescribed and will call provider for medication related questions as evidenced by patient reporting improvement in taken medications as prescribed    demonstrate understanding of rationale for each prescribed medication as evidenced by patient able to report to provider purpose of medication    attend all scheduled medical appointments: with primary care provider     demonstrate improved adherence to prescribed treatment plan for HLD, DMII, and Obesity as evidenced by improved Hgb A1C, weight loss of no weight gain, improvement in lipid panel continue to work with Medical illustrator and/or Social Worker to address care management and care coordination needs related to HLD, DMII, and Obesity as evidenced by adherence to CM Team Scheduled appointments  through collaboration with RN Care manager, provider, and care team.  Work with pharmacist to address inconsistent medication taking behavior and personal feeling regarding medications. i.e. "I don't like taking so many medications" and appropriateness of adding Tirzepatide to medication regimen in place of Trulicity  Interventions: Inter-disciplinary care team collaboration (see longitudinal plan of care) Evaluation of current treatment plan related to  self management and patient's adherence to plan as established by provider Care guide referral for dental resources-completed. Collaborated with Care Guide for resources. Encouraged patient to take all medications and keep ENDO appt.  To follow up with provider for needed referrals.  Hypertension Interventions:  (Status:  New goal.) Long Term Goal Last practice recorded BP readings:  BP Readings from Last 3 Encounters:  05/27/22 (!) 150/84  02/25/22 (!) 136/94  12/29/21 (!) 130/90  O5/07/24             124/79 11/11/22              120/75 10/19/22:  patient  does not check BP or take BP medication  Most recent eGFR/CrCl: No results found for: "EGFR"  No components found for: "CRCL"  Evaluation of current treatment plan related to hypertension self management and patient's adherence to plan as established by provider Reviewed medications with patient and discussed importance of compliance Discussed plans with patient for ongoing care management follow up and provided patient with direct contact information for care management team Advised patient, providing education and rationale, to monitor blood pressure daily and record, calling PCP for findings outside established parameters Reviewed scheduled/upcoming provider appointments  Assessed social determinant of health barriers   Interdisciplinary Collaboration Interventions:  (Status: Goal on track:  NO.) Long Term Goal  - Collaborated with BSW to initiate plan of care to address needs related to Financial constraints related to limited income, Limited access to food, and Lacks knowledge of community resource: to address food and financial insecurity and dental resources  in patient with HLD, DMII, and Obesity Collaboration with Philip Aspen, Limmie Patricia, MD regarding development and update  of comprehensive plan of care as evidenced by provider attestation and co-signature Inter-disciplinary care team collaboration   Referral placed with the pharmacist to address medication taking behavior and patient's personal feelings of "I don't like taking so many medications"  Schedule initial telephone appointment with Managed Medicaid BSW [name] to address financial insecurities and patient's inability to pay rent Referral sent to community care guide requesting resources provided in January related to financial insecurity and utilities be resent to patient   Diabetes:  (Status: Goal on track: NO.) Long Term Goal - patient states she is taking Oxempic  as prescribed  Lab Results  Component Value Date    HGBA1C 8.4 (A) 02/25/2021       HGBA1C                                                      12.6                                   02/25/2022      HGBA1C                                                      10.8                                  05/27/22      HGBA1C                                                      12.2                                  08/25/22      HGBA1C                                                       9.7                                   11/11/22  Provided education to patient about basic DM disease process; Reviewed medications with patient and discussed importance of medication adherence;        Reviewed prescribed diet with patient CHO controlled; Discussed plans with patient for ongoing care management follow up and provided patient with direct contact information for care management team;  10/19/22:  Encouraged patient to keep ENDO appt 7/24     Hyperlipidemia:  (Status: Condition stable. Not addressed this visit.) Long Term Goal  Lab Results  Component Value Date   CHOL 113 08/21/2020   HDL 44.70 08/21/2020   LDLCALC 52  08/21/2020   TRIG 80.0 08/21/2020   CHOLHDL 3 08/21/2020    Medication review performed; medication list updated in electronic medical record.   Weight Loss:  (Status: Goal on track: NO.) Long term Goal-  [facility] would [facility] Reviewed DM metabolic deficits including decreased satiety signaling to brain from intestines and newer medication Tirzepatide used to treat DM/ lipid/BP . Encouraged patient to discuss referral to [place] to assist her with weight management and glycemic control when she sees her primary care provider  Patient Goals/Self-Care Activities: Take medications as prescribed   Attend all scheduled provider appointments Call pharmacy for medication refills 3-7 days in advance of running out of medications Perform all self care  activities independently  Perform IADL's (shopping, preparing meals, housekeeping, managing finances) independently Call provider office for new concerns or questions  set goal weight fill half of plate with vegetables manage portion size switch to sugar-free drinks - call for medicine refill 2 or 3 days before it runs out - take all medications exactly as prescribed - call doctor with any symptoms you believe are related to your medicine - call doctor when you experience any new symptoms - go to all doctor appointments as scheduled - adhere to prescribed diet: CHO controlled - discuss referral to [facility] with primary care provider Patient to follow up on test strip-completed Patient to contact pharmacy/PCP/GYN regarding medications-completed Patient to call PCP office to f/u on ENDO referral placed 08/25/22-completed

## 2022-11-19 NOTE — Patient Outreach (Signed)
Medicaid Managed Care   Nurse Care Manager Note  11/19/2022 Name:  Angelica Berg MRN:  [id number] DOB:  1975/07/14  Angelica Berg is an 47 y.o. year old female who is a primary patient of Philip Aspen, Limmie Patricia, MD.  The [facility] Managed Care Coordination team was consulted for assistance with:    Chronic healthcare management needs, HLD, HTN, DM  [name] was given information about Medicaid Managed Care Coordination team services today. Angelica Berg Patient agreed to services and verbal consent obtained.  Engaged with patient by telephone for follow up visit in response to provider referral for case management and/or care coordination services.   Assessments/Interventions:  Review of past medical history, allergies, medications, health status, including review of consultants reports, laboratory and other test data, was performed as part of comprehensive evaluation and provision of chronic care management services.  SDOH (Social Determinants of Health) assessments and interventions performed: SDOH Interventions    Flowsheet Row Patient Outreach Telephone from 11/19/2022 in [place] POPULATION HEALTH DEPARTMENT Patient Outreach Telephone from 10/19/2022 in [place] POPULATION HEALTH DEPARTMENT Patient Outreach Telephone from 09/16/2022 in [place] POPULATION HEALTH DEPARTMENT Patient Outreach Telephone from 08/17/2022 in [place] POPULATION HEALTH DEPARTMENT Patient Outreach Telephone from 07/15/2022 in [place] POPULATION HEALTH DEPARTMENT Patient Outreach Telephone from 05/04/2022 in [place] POPULATION HEALTH DEPARTMENT  SDOH Interventions        Food Insecurity Interventions -- --  [referrals have been placed] -- -- -- --  Housing Interventions -- -- -- -- -- Intervention Not Indicated  Transportation Interventions -- -- Intervention Not Indicated -- -- --  Utilities Interventions -- -- -- -- Intervention Not Indicated --  Alcohol Usage Interventions -- -- -- --  Intervention Not Indicated (Score <7) --  Financial Strain Interventions -- Other (Comment)  [patient has been given resources] -- -- -- --  Physical Activity Interventions -- -- -- Patient Refused -- --  Stress Interventions -- -- -- Intervention Not Indicated -- --  Social Connections Interventions -- -- -- -- -- Intervention Not Indicated, Other (Comment)  [patient works 7 days a week right now]  Health Literacy Interventions Intervention Not Indicated -- -- -- -- --     Care Plan  No Known Allergies  Medications Reviewed Today     Reviewed by [name], RN (Registered Nurse) on 11/19/22 at 1440  Med List Status: <None>   Medication Order Taking? Sig Documenting Provider Last Dose Status Informant  amLODipine (NORVASC) 5 MG tablet [id number] No Take 1 tablet (5 mg total) by mouth daily.  Patient not taking: Reported on 07/15/2022   Philip Aspen, Limmie Patricia, MD Not Taking Active   atorvastatin (LIPITOR) 80 MG tablet [id number] No Take 1 tablet (80 mg total) by mouth daily. Philip Aspen, Limmie Patricia, MD Taking Active   Blood Glucose Monitoring Suppl (ACCU-CHEK GUIDE) w/Device KIT [id number] No 1 Device by Does not apply route daily. E11.9 [name], Konrad Dolores, MD Taking Active            Med Note Clearance Coots, Mel Almond Aug 04, 2021 11:03 AM)    Blood Glucose Monitoring Suppl [facility] VERIO) w/Device KIT [id number] No 1 Device by Does not apply route as directed.  Patient not taking: Reported on 11/11/2022   [name], Konrad Dolores, MD Not Taking Active   estradiol (ESTRACE) 0.1 MG/GM vaginal cream [id number] No Insert one gram into vagina twice weekly.  Patient not taking: Reported on 11/11/2022  [name], MD Not Taking Active   glucose blood (ACCU-CHEK GUIDE) test strip [id number] No Use as instructed to test blood sugar 2 times daily E11.9 Philip Aspen, Limmie Patricia, MD Taking Active   ibuprofen (ADVIL) 800 MG tablet [id number]  Take 1 tablet (800 mg total)  by mouth every 8 (eight) hours as needed. Philip Aspen, Limmie Patricia, MD  Active   lisinopril (ZESTRIL) 5 MG tablet [id number] No TAKE 1 TABLET BY MOUTH DAILY Philip Aspen, Limmie Patricia, MD Taking Active   metFORMIN (GLUCOPHAGE-XR) 500 MG 24 hr tablet [id number]  Take 2 tablets (1,000 mg total) by mouth 2 (two) times daily with a meal. Motwani, Komal, MD  Active   Semaglutide, 1 MG/DOSE, 4 MG/3ML SOPN [id number]  Inject 1 mg as directed once a week. Altamese Foard, MD  Active   Vitamin D, Ergocalciferol, (DRISDOL) 1.25 MG (50000 UNIT) CAPS capsule [id number] No Take 1 capsule (50,000 Units total) by mouth every 7 (seven) days for 12 doses.  Patient not taking: Reported on 11/11/2022   Philip Aspen, Limmie Patricia, MD Not Taking Active            Patient Active Problem List   Diagnosis Date Noted   Non-adherence to medical treatment 08/25/2022   Hypertension associated with diabetes (HCC) 02/25/2022   Pain in left foot 12/31/2021   Pain in left knee 05/23/2019   Type 2 diabetes mellitus with diabetic nephropathy, without long-term current use of insulin (HCC) 08/30/2018   Morbid obesity (HCC) 08/30/2018   Vitamin D deficiency 08/30/2018   Hyperlipidemia associated with type 2 diabetes mellitus (HCC) 08/30/2018   Transaminitis 08/30/2018   Conditions to be addressed/monitored per PCP order:  Chronic healthcare management needs, HLD, HTN, DM  Care Plan : RN Care Manager Plan Of Care  Updates made by [name], RN since 11/19/2022 12:00 AM     Problem: Knowledge Deficit and Care Coordination Needs Related to Management of  DM, obesity and HLD   Priority: High     Long-Range Goal: Development of Plan Of Care to Address Care Coordination Needs and Knowledge Deficits for Management of DM, obesity, HLD   Start Date: 04/22/2021  Expected End Date: 02/19/2023  Priority: High  Note:   Current Barriers:  Knowledge Deficits related to plan of care for management of HLD, DMII, HTN and Obesity   11/19/22:  Patient seen and evaluated by ENDO 7/24-Ozempic and Metformin increased.  BP stable-120/75 at ENDO appt 7/24-patient does not check.  Blood sugar 214 today. Taking IBP 800 mg for back pain-to f/u with PCP 8/7.    RNCM Clinical Goal(s):  Patient will verbalize basic understanding of HLD, DMII, and obesity disease process and self health management plan as evidenced by improved Hgb A1C, weight loss or no weight gain, normal lipid panel take all medications exactly as prescribed and will call provider for medication related questions as evidenced by patient reporting improvement in taken medications as prescribed    demonstrate understanding of rationale for each prescribed medication as evidenced by patient able to report to provider purpose of medication    attend all scheduled medical appointments: with primary care provider     demonstrate improved adherence to prescribed treatment plan for HLD, DMII, and Obesity as evidenced by improved Hgb A1C, weight loss of no weight gain, improvement in lipid panel continue to work with Medical illustrator and/or Social Worker to address care management and care coordination needs related to HLD, DMII, and Obesity as evidenced  by adherence to CM Team Scheduled appointments  through collaboration with RN Care manager, provider, and care team.  Work with pharmacist to address inconsistent medication taking behavior and personal feeling regarding medications. i.e. "I don't like taking so many medications" and appropriateness of adding Tirzepatide to medication regimen in place of Trulicity  Interventions: Inter-disciplinary care team collaboration (see longitudinal plan of care) Evaluation of current treatment plan related to  self management and patient's adherence to plan as established by provider Care guide referral for dental resources-completed. Collaborated with Care Guide for resources. Encouraged patient to take all medications and keep ENDO appt.   To follow up with provider for needed referrals.  Hypertension Interventions:  (Status:  New goal.) Long Term Goal Last practice recorded BP readings:  BP Readings from Last 3 Encounters:  05/27/22 (!) 150/84  02/25/22 (!) 136/94  12/29/21 (!) 130/90  O5/07/24             124/79 11/11/22              120/75 10/19/22:  patient does not check BP or take BP medication  Most recent eGFR/CrCl: No results found for: "EGFR"  No components found for: "CRCL"  Evaluation of current treatment plan related to hypertension self management and patient's adherence to plan as established by provider Reviewed medications with patient and discussed importance of compliance Discussed plans with patient for ongoing care management follow up and provided patient with direct contact information for care management team Advised patient, providing education and rationale, to monitor blood pressure daily and record, calling PCP for findings outside established parameters Reviewed scheduled/upcoming provider appointments  Assessed social determinant of health barriers   Interdisciplinary Collaboration Interventions:  (Status: Goal on track:  NO.) Long Term Goal  - Collaborated with BSW to initiate plan of care to address needs related to Financial constraints related to limited income, Limited access to food, and Lacks knowledge of community resource: to address food and financial insecurity and dental resources  in patient with HLD, DMII, and Obesity Collaboration with Philip Aspen, Limmie Patricia, MD regarding development and update of comprehensive plan of care as evidenced by provider attestation and co-signature Inter-disciplinary care team collaboration   Referral placed with the pharmacist to address medication taking behavior and patient's personal feelings of "I don't like taking so many medications"  Schedule initial telephone appointment with Managed Medicaid BSW [name] to address financial  insecurities and patient's inability to pay rent Referral sent to community care guide requesting resources provided in January related to financial insecurity and utilities be resent to patient   Diabetes:  (Status: Goal on track: NO.) Long Term Goal - patient states she is taking Oxempic  as prescribed  Lab Results  Component Value Date   HGBA1C 8.4 (A) 02/25/2021       HGBA1C                                                      12.6                                   02/25/2022      HGBA1C  10.8                                  05/27/22      HGBA1C                                                      12.2                                  08/25/22      HGBA1C                                                       9.7                                   11/11/22  Provided education to patient about basic DM disease process; Reviewed medications with patient and discussed importance of medication adherence;        Reviewed prescribed diet with patient CHO controlled; Discussed plans with patient for ongoing care management follow up and provided patient with direct contact information for care management team;  10/19/22:  Encouraged patient to keep ENDO appt 7/24     Hyperlipidemia:  (Status: Condition stable. Not addressed this visit.) Long Term Goal  Lab Results  Component Value Date   CHOL 113 08/21/2020   HDL 44.70 08/21/2020   LDLCALC 52 08/21/2020   TRIG 80.0 08/21/2020   CHOLHDL 3 08/21/2020    Medication review performed; medication list updated in electronic medical record.   Weight Loss:  (Status: Goal on track: NO.) Long term Goal-  [facility] Reviewed DM metabolic deficits including decreased satiety signaling to brain from intestines and newer medication Tirzepatide used to treat DM/ lipid/BP . Encouraged patient to discuss referral to [facility] to assist her with weight management and glycemic control when she sees her primary care provider  Patient Goals/Self-Care Activities: Take medications as prescribed   Attend all scheduled provider appointments Call pharmacy for medication refills 3-7 days in advance of running out of medications Perform all self care activities independently  Perform IADL's (shopping, preparing meals, housekeeping, managing finances) independently Call provider office for new concerns or questions  set goal weight fill half of plate with vegetables manage portion size switch to sugar-free drinks - call for medicine refill 2 or 3 days before it runs out - take all medications exactly as prescribed - call doctor with any symptoms you believe are related to your medicine - call doctor when you experience any new symptoms - go to all doctor appointments as scheduled - adhere to prescribed diet: CHO controlled - discuss referral to [facility] with primary care provider Patient to follow up on test strip-completed Patient to contact pharmacy/PCP/GYN regarding medications-completed Patient to call PCP office to f/u on ENDO referral placed 08/25/22-completed   Long-Range Goal: Establish Plan of Care  for Chronic Disease Management Needs   Priority: High  Note:   Timeframe:  Long-Range Goal Priority:  High Start Date:     08/18/21                        Expected End Date: ongoing                      Follow Up Date: 01/19/23  - practice safe sex - schedule appointment for flu shot - schedule appointment for vaccines needed due to my age or health - schedule recommended health tests (blood work, mammogram, colonoscopy, pap test) - schedule and keep appointment for annual check-up    Why is this important?   Screening tests can find diseases early when they are easier to treat.  Your doctor or nurse will talk with you about which tests are important for you.  Getting  shots for common diseases like the flu and shingles will help prevent them.   11/19/22-PCP 8/7   Follow Up:  Patient agrees to Care Plan and Follow-up.  Plan: The Managed Medicaid care management team will reach out to the patient again over the next 60 business  days. and The  Patient has been provided with contact information for the Managed Medicaid care management team and has been advised to call with any health related questions or concerns.  Date/time of next scheduled RN care management/care coordination outreach:  01/19/23 at 230

## 2022-11-25 ENCOUNTER — Ambulatory Visit (INDEPENDENT_AMBULATORY_CARE_PROVIDER_SITE_OTHER): Payer: Medicaid Other | Admitting: Internal Medicine

## 2022-11-25 DIAGNOSIS — Z91199 Patient's noncompliance with other medical treatment and regimen due to unspecified reason: Secondary | ICD-10-CM

## 2022-11-25 DIAGNOSIS — I152 Hypertension secondary to endocrine disorders: Secondary | ICD-10-CM | POA: Diagnosis not present

## 2022-11-25 DIAGNOSIS — E1169 Type 2 diabetes mellitus with other specified complication: Secondary | ICD-10-CM

## 2022-11-25 DIAGNOSIS — E785 Hyperlipidemia, unspecified: Secondary | ICD-10-CM | POA: Diagnosis not present

## 2022-11-25 DIAGNOSIS — E1159 Type 2 diabetes mellitus with other circulatory complications: Secondary | ICD-10-CM | POA: Diagnosis not present

## 2022-11-25 DIAGNOSIS — E1121 Type 2 diabetes mellitus with diabetic nephropathy: Secondary | ICD-10-CM | POA: Diagnosis not present

## 2022-11-25 NOTE — Assessment & Plan Note (Signed)
Discussed healthy lifestyle, including increased physical activity and better food choices to promote weight loss.  

## 2022-11-25 NOTE — Progress Notes (Signed)
Established Patient Office Visit     CC/Reason for Visit: Follow-up chronic conditions  HPI: Angelica Berg is a 47 y.o. female who is coming in today for the above mentioned reasons. Past Medical History is significant for: Hypertension, hyperlipidemia, type 2 diabetes, morbid obesity.  Her greatest issue is medication nonadherence.  She had her first visit with endocrinology.  She has not taken metformin or amlodipine.   Past Medical/Surgical History: Past Medical History:  Diagnosis Date   Diabetes mellitus (HCC)    Dyslipidemia     Past Surgical History:  Procedure Laterality Date   CESAREAN SECTION     KNEE ARTHROCENTESIS      Social History:  reports that she quit smoking about 4 years ago. Her smoking use included cigarettes. She has never used smokeless tobacco. She reports current alcohol use. She reports current drug use. Frequency: 7.00 times per week. Drug: Marijuana.  Allergies: No Known Allergies  Family History:  Family History  Problem Relation Age of Onset   Diabetes Mother    Hypertension Mother    Cancer Maternal Grandmother        throat   Heart disease Maternal Grandmother      Current Outpatient Medications:    atorvastatin (LIPITOR) 80 MG tablet, Take 1 tablet (80 mg total) by mouth daily., Disp: 90 tablet, Rfl: 1   Blood Glucose Monitoring Suppl (ACCU-CHEK GUIDE) w/Device KIT, 1 Device by Does not apply route daily. E11.9, Disp: 1 kit, Rfl: 0   Blood Glucose Monitoring Suppl (ONETOUCH VERIO) w/Device KIT, 1 Device by Does not apply route as directed., Disp: 1 kit, Rfl: 0   estradiol (ESTRACE) 0.1 MG/GM vaginal cream, Insert one gram into vagina twice weekly., Disp: 42.5 g, Rfl: 2   glucose blood (ACCU-CHEK GUIDE) test strip, Use as instructed to test blood sugar 2 times daily E11.9, Disp: 100 each, Rfl: 12   ibuprofen (ADVIL) 800 MG tablet, Take 1 tablet (800 mg total) by mouth every 8 (eight) hours as needed., Disp: 90 tablet, Rfl: 0    lisinopril (ZESTRIL) 5 MG tablet, TAKE 1 TABLET BY MOUTH DAILY, Disp: 90 tablet, Rfl: 1   metFORMIN (GLUCOPHAGE-XR) 500 MG 24 hr tablet, Take 2 tablets (1,000 mg total) by mouth 2 (two) times daily with a meal., Disp: 120 tablet, Rfl: 3   Semaglutide, 1 MG/DOSE, 4 MG/3ML SOPN, Inject 1 mg as directed once a week., Disp: 3 mL, Rfl: 3   Vitamin D, Ergocalciferol, (DRISDOL) 1.25 MG (50000 UNIT) CAPS capsule, Take 1 capsule (50,000 Units total) by mouth every 7 (seven) days for 12 doses., Disp: 12 capsule, Rfl: 0   amLODipine (NORVASC) 5 MG tablet, Take 1 tablet (5 mg total) by mouth daily. (Patient not taking: Reported on 11/25/2022), Disp: 90 tablet, Rfl: 1  Review of Systems:  Negative unless indicated in HPI.   Physical Exam: Vitals:   11/25/22 0825  BP: (!) 132/92  Pulse: 84  Temp: 98 F (36.7 C)  TempSrc: Oral  SpO2: 97%  Weight: 237 lb 1.6 oz (107.5 kg)    Body mass index is 35.01 kg/m.   Physical Exam Vitals reviewed.  Constitutional:      Appearance: Normal appearance.  HENT:     Head: Normocephalic and atraumatic.  Eyes:     Conjunctiva/sclera: Conjunctivae normal.     Pupils: Pupils are equal, round, and reactive to light.  Cardiovascular:     Rate and Rhythm: Normal rate and regular rhythm.  Pulmonary:  Effort: Pulmonary effort is normal.     Breath sounds: Normal breath sounds.  Skin:    General: Skin is warm and dry.  Neurological:     General: No focal deficit present.     Mental Status: She is alert and oriented to person, place, and time.  Psychiatric:        Mood and Affect: Mood normal.        Behavior: Behavior normal.        Thought Content: Thought content normal.        Judgment: Judgment normal.      Impression and Plan:  Morbid obesity (HCC) Assessment & Plan: -Discussed healthy lifestyle, including increased physical activity and better food choices to promote weight loss.    Type 2 diabetes mellitus with diabetic nephropathy,  without long-term current use of insulin (HCC) Assessment & Plan: Uncontrolled with an A1c of 9.7, greatly due to medication non-adherence.  Had her first visit with endocrinology.  She states she has been taking 1 mg of Ozempic but has not yet started metformin for unknown reasons.   Hyperlipidemia associated with type 2 diabetes mellitus (HCC) Assessment & Plan: LDL 72 on atorvastatin 80 mg daily.   Hypertension associated with diabetes [facility]) Assessment & Plan: Not well-controlled.  Have discussed importance of medication compliance.  She states she will resume amlodipine.  She is taking 5 mg of lisinopril.   Non-adherence to medical treatment Assessment & Plan: Main issue impeding healthcare. CM/SW on board. Currently not on amlodipine or metformin.      Time spent:33 minutes reviewing chart, interviewing and examining patient and formulating plan of care.     Chaya Jan, MD [facility]

## 2022-11-25 NOTE — Assessment & Plan Note (Signed)
Not well-controlled.  Have discussed importance of medication compliance.  She states she will resume amlodipine.  She is taking 5 mg of lisinopril.

## 2022-11-25 NOTE — Assessment & Plan Note (Signed)
LDL 72 on atorvastatin 80 mg daily.

## 2022-11-25 NOTE — Assessment & Plan Note (Signed)
Uncontrolled with an A1c of 9.7, greatly due to medication non-adherence.  Had her first visit with endocrinology.  She states she has been taking 1 mg of Ozempic but has not yet started metformin for unknown reasons.

## 2022-11-25 NOTE — Assessment & Plan Note (Signed)
Main issue impeding healthcare. CM/SW on board. Currently not on amlodipine or metformin.

## 2022-12-10 ENCOUNTER — Other Ambulatory Visit: Payer: Self-pay | Admitting: Internal Medicine

## 2022-12-26 ENCOUNTER — Other Ambulatory Visit: Payer: Self-pay | Admitting: Internal Medicine

## 2022-12-26 DIAGNOSIS — E119 Type 2 diabetes mellitus without complications: Secondary | ICD-10-CM

## 2022-12-26 DIAGNOSIS — E1129 Type 2 diabetes mellitus with other diabetic kidney complication: Secondary | ICD-10-CM

## 2022-12-29 ENCOUNTER — Other Ambulatory Visit: Payer: Self-pay | Admitting: Internal Medicine

## 2022-12-29 DIAGNOSIS — E1129 Type 2 diabetes mellitus with other diabetic kidney complication: Secondary | ICD-10-CM

## 2023-01-14 ENCOUNTER — Other Ambulatory Visit: Payer: Self-pay | Admitting: Internal Medicine

## 2023-01-15 ENCOUNTER — Other Ambulatory Visit: Payer: Self-pay | Admitting: Internal Medicine

## 2023-01-15 DIAGNOSIS — E1129 Type 2 diabetes mellitus with other diabetic kidney complication: Secondary | ICD-10-CM

## 2023-01-19 ENCOUNTER — Other Ambulatory Visit: Payer: Medicaid Other | Admitting: Obstetrics and Gynecology

## 2023-01-19 NOTE — Patient Instructions (Signed)
Hi Ms. Sofranko, I hope you are doing okay, sorry to miss you today - as a part of your Medicaid benefit, you are eligible for care management and care coordination services at no cost or copay. I was unable to reach you by phone today but would be happy to help you with your health related needs. Please feel free to call me at [phone].  A member of the Managed Medicaid care management team will reach out to you again over the next 30 business  days.   [name] RN, BSN [place]  [employer] - Managed Medicaid High Risk [phone]

## 2023-01-19 NOTE — Patient Outreach (Signed)
Medicaid Managed Care   Unsuccessful Attempt Note   01/19/2023 Name: Angelica Berg MRN: [id number] DOB: Sep 07, 1975  Referred by: Philip Aspen, Limmie Patricia, MD Reason for referral : High Risk Managed Medicaid (Unsuccessful telephone outreach)  An unsuccessful telephone outreach was attempted today. The patient was referred to the case management team for assistance with care management and care coordination.    Follow Up Plan: The Managed Medicaid care management team will reach out to the patient again over the next 30 business  days. and The  Patient has been provided with contact information for the Managed Medicaid care management team and has been advised to call with any health related questions or concerns.   [name] RN, BSN [place]  [employer] - Managed Medicaid High Risk [phone]

## 2023-01-20 NOTE — Telephone Encounter (Signed)
Pt states she would like to go back to how MD prescribed this Rx, previously. Pt says Md instructed her to take one pill (1000 mg), once a day. Pt's other provider prescribed 2 pills, twice a day.  Pt says that taking it that way is making her feel sick.  Pt reports she is experiencing heavy constipation and pain in her rectum.  Pt informed MD is OOO this week.   Please call Pt back to discuss.   [id number] [place], [place] - [address] AT [facility] [address] Phone: [phone]  Fax: [phone]

## 2023-01-29 NOTE — Telephone Encounter (Addendum)
Pt  called to F/U on the status of this request?  Pt is asking if/when MD will send the Rx for the metFORMIN 1000 mg?   Pt informed MD is OOO on Fridays.  [id number] [place], [place] - [address] Phone: [phone]  Fax: [phone]

## 2023-02-15 ENCOUNTER — Other Ambulatory Visit: Payer: Self-pay | Admitting: Obstetrics and Gynecology

## 2023-02-15 NOTE — Patient Outreach (Signed)
Medicaid Managed Care   Nurse Care Manager Note  02/15/2023 Name:  Angelica Berg MRN:  [id number] DOB:  05/21/1975  Angelica Berg is an 47 y.o. year old female who is a primary patient of Philip Aspen, Limmie Patricia, MD.  The [facility] Managed Care Coordination team was consulted for assistance with:    Chronic healthcare management needs,  HLD, HTN, DM  Ms. Matovich was given information about Medicaid Managed Care Coordination team services today. Angelica Berg Patient agreed to services and verbal consent obtained.  Engaged with patient by telephone for follow up visit in response to provider referral for case management and/or care coordination services.   Assessments/Interventions:  Review of past medical history, allergies, medications, health status, including review of consultants reports, laboratory and other test data, was performed as part of comprehensive evaluation and provision of chronic care management services.  SDOH (Social Determinants of Health) assessments and interventions performed: SDOH Interventions    Flowsheet Row Patient Outreach Telephone from 02/15/2023 in [place] POPULATION HEALTH DEPARTMENT Patient Outreach Telephone from 11/19/2022 in [place] POPULATION HEALTH DEPARTMENT Patient Outreach Telephone from 10/19/2022 in [place] POPULATION HEALTH DEPARTMENT Patient Outreach Telephone from 09/16/2022 in [place] POPULATION HEALTH DEPARTMENT Patient Outreach Telephone from 08/17/2022 in [place] POPULATION HEALTH DEPARTMENT Patient Outreach Telephone from 07/15/2022 in  POPULATION HEALTH DEPARTMENT  SDOH Interventions        Food Insecurity Interventions -- -- --  [referrals have been placed] -- -- --  Housing Interventions Intervention Not Indicated -- -- -- -- --  Transportation Interventions -- -- -- Intervention Not Indicated -- --  Utilities Interventions -- -- -- -- -- Intervention Not Indicated  Alcohol Usage Interventions -- -- -- --  -- Intervention Not Indicated (Score <7)  Financial Strain Interventions -- -- Other (Comment)  [patient has been given resources] -- -- --  Physical Activity Interventions -- -- -- -- Patient Refused --  Stress Interventions -- -- -- -- Intervention Not Indicated --  Social Connections Interventions Intervention Not Indicated -- -- -- -- --  Health Literacy Interventions -- Intervention Not Indicated -- -- -- --     Care Plan No Known Allergies  Medications Reviewed Today     Reviewed by [name], RN (Registered Nurse) on 02/15/23 at 1539  Med List Status: <None>   Medication Order Taking? Sig Documenting Provider Last Dose Status Informant  amLODipine (NORVASC) 5 MG tablet [id number] No Take 1 tablet (5 mg total) by mouth daily.  Patient not taking: Reported on 11/25/2022   Philip Aspen, Limmie Patricia, MD Not Taking Active   atorvastatin (LIPITOR) 80 MG tablet [id number] No Take 1 tablet (80 mg total) by mouth daily. Philip Aspen, Limmie Patricia, MD Taking Active   Blood Glucose Monitoring Suppl (ACCU-CHEK GUIDE) w/Device KIT [id number] No 1 Device by Does not apply route daily. E11.9 [name], Konrad Dolores, MD Taking Active            Med Note Clearance Coots, Mel Almond Aug 04, 2021 11:03 AM)    Blood Glucose Monitoring Suppl [facility] VERIO) w/Device KIT [id number] No 1 Device by Does not apply route as directed. [name], Konrad Dolores, MD Taking Active   estradiol (ESTRACE) 0.1 MG/GM vaginal cream [id number] No Insert one gram into vagina twice weekly. [name], MD Taking Active   glucose blood (ACCU-CHEK GUIDE) test strip [id number] No Use as instructed to test blood sugar 2 times daily E11.9 Philip Aspen, Limmie Patricia,  MD Taking Active   ibuprofen (ADVIL) 800 MG tablet [id number]  TAKE 1 TABLET BY MOUTH EVERY 8 HOURS AS NEEDED Philip Aspen, Limmie Patricia, MD  Active   lisinopril (ZESTRIL) 5 MG tablet [id number]  TAKE 1 TABLET BY MOUTH DAILY Philip Aspen, Limmie Patricia, MD   Active   metFORMIN (GLUCOPHAGE-XR) 500 MG 24 hr tablet [id number] No Take 2 tablets (1,000 mg total) by mouth 2 (two) times daily with a meal. Altamese Horine, MD Taking Expired 12/11/22 2359   Semaglutide, 1 MG/DOSE, 4 MG/3ML SOPN [id number] No Inject 1 mg as directed once a week. Altamese [place], MD Taking Active            Patient Active Problem List   Diagnosis Date Noted   Non-adherence to medical treatment 08/25/2022   Hypertension associated with diabetes (HCC) 02/25/2022   Pain in left foot 12/31/2021   Pain in left knee 05/23/2019   Type 2 diabetes mellitus with diabetic nephropathy, without long-term current use of insulin (HCC) 08/30/2018   Morbid obesity (HCC) 08/30/2018   Vitamin D deficiency 08/30/2018   Hyperlipidemia associated with type 2 diabetes mellitus (HCC) 08/30/2018   Transaminitis 08/30/2018   Conditions to be addressed/monitored per PCP order:  Chronic healthcare management needs, HTN, HLD, DM  Care Plan : RN Care Manager Plan Of Care  Updates made by [name], RN since 02/15/2023 12:00 AM     Problem: Knowledge Deficit and Care Coordination Needs Related to Management of  DM, obesity and HLD   Priority: High     Long-Range Goal: Development of Plan Of Care to Address Care Coordination Needs and Knowledge Deficits for Management of DM, obesity, HLD   Start Date: 04/22/2021  Expected End Date: 05/18/2023  Priority: High  Note:   Current Barriers:  Knowledge Deficits related to plan of care for management of HLD, DMII, HTN and Obesity  02/05/23:  Does not take BP med or Metformin-on Ozempic.  Blood sugars 150-155 per patient.  Has ENDO f/u tomorrow.    RNCM Clinical Goal(s):  Patient will verbalize basic understanding of HLD, DMII, and obesity disease process and self health management plan as evidenced by improved Hgb A1C, weight loss or no weight gain, normal lipid panel take all medications exactly as prescribed and will call provider for  medication related questions as evidenced by patient reporting improvement in taken medications as prescribed    demonstrate understanding of rationale for each prescribed medication as evidenced by patient able to report to provider purpose of medication    attend all scheduled medical appointments: with primary care provider     demonstrate improved adherence to prescribed treatment plan for HLD, DMII, and Obesity as evidenced by improved Hgb A1C, weight loss of no weight gain, improvement in lipid panel continue to work with Medical illustrator and/or Social Worker to address care management and care coordination needs related to HLD, DMII, and Obesity as evidenced by adherence to CM Team Scheduled appointments  through collaboration with RN Care manager, provider, and care team.  Work with pharmacist to address inconsistent medication taking behavior and personal feeling regarding medications. i.e. "I don't like taking so many medications" and appropriateness of adding Tirzepatide to medication regimen in place of Trulicity  Interventions: Inter-disciplinary care team collaboration (see longitudinal plan of care) Evaluation of current treatment plan related to  self management and patient's adherence to plan as established by provider Care guide referral for dental resources-completed. Collaborated with Care Guide for resources. Encouraged  patient to take all medications and keep ENDO appt.  To follow up with provider for needed referrals.  Hypertension Interventions:  (Status:  New goal.) Long Term Goal Last practice recorded BP readings:  BP Readings from Last 3 Encounters:  05/27/22 (!) 150/84  02/25/22 (!) 136/94  12/29/21 (!) 130/90  O5/07/24             124/79 11/11/22              120/75 10/19/22:  patient does not check BP or take BP medication  Most recent eGFR/CrCl: No results found for: "EGFR"  No components found for: "CRCL"  Evaluation of current treatment plan related to  hypertension self management and patient's adherence to plan as established by provider Reviewed medications with patient and discussed importance of compliance Discussed plans with patient for ongoing care management follow up and provided patient with direct contact information for care management team Advised patient, providing education and rationale, to monitor blood pressure daily and record, calling PCP for findings outside established parameters Reviewed scheduled/upcoming provider appointments  Assessed social determinant of health barriers   Interdisciplinary Collaboration Interventions:  (Status: Goal on track:  NO.) Long Term Goal  - Collaborated with BSW to initiate plan of care to address needs related to Financial constraints related to limited income, Limited access to food, and Lacks knowledge of community resource: to address food and financial insecurity and dental resources  in patient with HLD, DMII, and Obesity Collaboration with Philip Aspen, Limmie Patricia, MD regarding development and update of comprehensive plan of care as evidenced by provider attestation and co-signature Inter-disciplinary care team collaboration   Referral placed with the pharmacist to address medication taking behavior and patient's personal feelings of "I don't like taking so many medications"  Schedule initial telephone appointment with Managed Medicaid BSW [name] to address financial insecurities and patient's inability to pay rent Referral sent to community care guide requesting resources provided in January related to financial insecurity and utilities be resent to patient   Diabetes:  (Status: Goal on track: NO.) Long Term Goal - patient states she is taking Oxempic  as prescribed  Lab Results  Component Value Date        HGBA1C                                                      10.8                                  05/27/22      HGBA1C                                                       12.2                                  08/25/22      HGBA1C  9.7                                   11/11/22  Provided education to patient about basic DM disease process; Reviewed medications with patient and discussed importance of medication adherence;        Reviewed prescribed diet with patient CHO controlled; Discussed plans with patient for ongoing care management follow up and provided patient with direct contact information for care management team;   Hyperlipidemia:  (Status: Condition stable. Not addressed this visit.) Long Term Goal  Lab Results  Component Value Date   CHOL 113 08/21/2020   HDL 44.70 08/21/2020   LDLCALC 52 08/21/2020   TRIG 80.0 08/21/2020   CHOLHDL 3 08/21/2020    Medication review performed; medication list updated in electronic medical record.   Weight Loss:  (Status: Goal on track: NO.) Long term Goal-  [facility] Reviewed DM metabolic deficits including decreased satiety signaling to brain from intestines and newer medication Tirzepatide used to treat DM/ lipid/BP . Encouraged patient to discuss referral to [facility] to assist her with weight management and glycemic control when she sees her primary care provider  Patient Goals/Self-Care Activities: Take medications as prescribed   Attend all scheduled provider appointments Call pharmacy for medication refills 3-7 days in advance of running out of medications Perform all self care activities independently  Perform IADL's (shopping, preparing meals, housekeeping, managing finances) independently Call provider office for new concerns or questions  set goal weight fill half of plate with vegetables manage portion size switch to sugar-free drinks - call for medicine refill 2 or 3 days before it runs out - take all medications exactly as prescribed - call  doctor with any symptoms you believe are related to your medicine - call doctor when you experience any new symptoms - go to all doctor appointments as scheduled - adhere to prescribed diet: CHO controlled - discuss [facility] with primary care provider Patient to follow up on test strip-completed Patient to contact pharmacy/PCP/GYN regarding medications-completed Patient to call PCP office to f/u on ENDO referral placed 08/25/22-completed   Long-Range Goal: Establish Plan of Care for Chronic Disease Management Needs   Priority: High  Note:   Timeframe:  Long-Range Goal Priority:  High Start Date:     08/18/21                        Expected End Date: ongoing                      Follow Up Date: 03/19/23  - practice safe sex - schedule appointment for flu shot - schedule appointment for vaccines needed due to my age or health - schedule recommended health tests (blood work, mammogram, colonoscopy, pap test) - schedule and keep appointment for annual check-up    Why is this important?   Screening tests can find diseases early when they are easier to treat.  Your doctor or nurse will talk with you about which tests are important for you.  Getting shots for common diseases like the flu and shingles will help prevent them.   02/15/23:  patient has appt with ENDO tomorrow and PCP 11/7   Follow Up:  Patient agrees to Care Plan and Follow-up.  Plan: The  Managed Medicaid care management team will reach out to the patient again over the next 30 business  days. and The  Patient has been provided with contact information for the Managed Medicaid care management team and has been advised to call with any health related questions or concerns.  Date/time of next scheduled RN care management/care coordination outreach: 03/19/23 at 1230

## 2023-02-15 NOTE — Patient Instructions (Signed)
Hi Ms. Mazzola, thank you for the updates, have a great afternoon!  Ms. Scalisi was given information about Medicaid Managed Care team care coordination services as a part of their [facility] Community Plan Medicaid benefit. [name] verbally consented to engagement with the [facility] Managed Care team.   If you are experiencing a medical emergency, please call 911 or report to your local emergency department or urgent care.   If you have a non-emergency medical problem during routine business hours, please contact your provider's office and ask to speak with a nurse.   For questions related to your [facility], please call: [phone] or visit the homepage here: [email or web address]  If you would like to schedule transportation through your [facility], please call the following number at least 2 days in advance of your appointment: [phone]   Rides for urgent appointments can also be made after hours by calling Member Services.  Call the Behavioral Health Crisis Line at [phone], at any time, 24 hours a day, 7 days a week. If you are in danger or need immediate medical attention call 911.  If you would like help to quit smoking, call 1-800-QUIT-NOW ((919)554-7033) OR Espaol: 1-855-Djelo-Ya (7-564-332-9518) o para ms informacin haga clic aqu or Text READY to 841-660 to register via text  [name] - following are the goals we discussed in your visit today:   Goals Addressed    Timeframe:  Long-Range Goal Priority:  High Start Date:     08/18/21                        Expected End Date: ongoing                      Follow Up Date: 03/19/23  - practice safe sex - schedule appointment for flu shot - schedule appointment for vaccines needed due to my age or health - schedule recommended health tests (blood work, mammogram, colonoscopy, pap test) - schedule and  keep appointment for annual check-up    Why is this important?   Screening tests can find diseases early when they are easier to treat.  Your doctor or nurse will talk with you about which tests are important for you.  Getting shots for common diseases like the flu and shingles will help prevent them.   02/15/23:  patient has appt with ENDO tomorrow and PCP 11/7  Patient verbalizes understanding of instructions and care plan provided today and agrees to view in MyChart. Active MyChart status and patient understanding of how to access instructions and care plan via MyChart confirmed with patient.     The Managed Medicaid care management team will reach out to the patient again over the next 30 business  days.  The  Patient  has been provided with contact information for the Managed Medicaid care management team and has been advised to call with any health related questions or concerns.   [name] RN, BSN   [facility] Care Management Coordinator - Managed Medicaid High Risk [phone]   Following is a copy of your plan of care:  Care Plan : RN Care Manager Plan Of Care  Updates made by [name], RN since 02/15/2023 12:00 AM     Problem: Knowledge Deficit and Care Coordination Needs Related to Management of  DM, obesity and HLD   Priority: High     Long-Range Goal: Development  of Plan Of Care to Address Care Coordination Needs and Knowledge Deficits for Management of DM, obesity, HLD   Start Date: 04/22/2021  Expected End Date: 05/18/2023  Priority: High  Note:   Current Barriers:  Knowledge Deficits related to plan of care for management of HLD, DMII, HTN and Obesity  02/05/23:  Does not take BP med or Metformin-on Ozempic.  Blood sugars 150-155 per patient.  Has ENDO f/u tomorrow.    RNCM Clinical Goal(s):  Patient will verbalize basic understanding of HLD, DMII, and obesity disease process and self health management plan as evidenced by improved  Hgb A1C, weight loss or no weight gain, normal lipid panel take all medications exactly as prescribed and will call provider for medication related questions as evidenced by patient reporting improvement in taken medications as prescribed    demonstrate understanding of rationale for each prescribed medication as evidenced by patient able to report to provider purpose of medication    attend all scheduled medical appointments: with primary care provider     demonstrate improved adherence to prescribed treatment plan for HLD, DMII, and Obesity as evidenced by improved Hgb A1C, weight loss of no weight gain, improvement in lipid panel continue to work with Medical illustrator and/or Social Worker to address care management and care coordination needs related to HLD, DMII, and Obesity as evidenced by adherence to CM Team Scheduled appointments  through collaboration with RN Care manager, provider, and care team.  Work with pharmacist to address inconsistent medication taking behavior and personal feeling regarding medications. i.e. "I don't like taking so many medications" and appropriateness of adding Tirzepatide to medication regimen in place of Trulicity  Interventions: Inter-disciplinary care team collaboration (see longitudinal plan of care) Evaluation of current treatment plan related to  self management and patient's adherence to plan as established by provider Care guide referral for dental resources-completed. Collaborated with Care Guide for resources. Encouraged patient to take all medications and keep ENDO appt.  To follow up with provider for needed referrals.  Hypertension Interventions:  (Status:  New goal.) Long Term Goal Last practice recorded BP readings:  BP Readings from Last 3 Encounters:  05/27/22 (!) 150/84  02/25/22 (!) 136/94  12/29/21 (!) 130/90  O5/07/24             124/79 11/11/22              120/75 10/19/22:  patient does not check BP or take BP medication  Most recent  eGFR/CrCl: No results found for: "EGFR"  No components found for: "CRCL"  Evaluation of current treatment plan related to hypertension self management and patient's adherence to plan as established by provider Reviewed medications with patient and discussed importance of compliance Discussed plans with patient for ongoing care management follow up and provided patient with direct contact information for care management team Advised patient, providing education and rationale, to monitor blood pressure daily and record, calling PCP for findings outside established parameters Reviewed scheduled/upcoming provider appointments  Assessed social determinant of health barriers   Interdisciplinary Collaboration Interventions:  (Status: Goal on track:  NO.) Long Term Goal  - Collaborated with BSW to initiate plan of care to address needs related to Financial constraints related to limited income, Limited access to food, and Lacks knowledge of community resource: to address food and financial insecurity and dental resources  in patient with HLD, DMII, and Obesity Collaboration with Philip Aspen, Limmie Patricia, MD regarding development and update of comprehensive plan of care as  evidenced by provider attestation and co-signature Inter-disciplinary care team collaboration   Referral placed with the pharmacist to address medication taking behavior and patient's personal feelings of "I don't like taking so many medications"  Schedule initial telephone appointment with Managed Medicaid BSW [name] to address financial insecurities and patient's inability to pay rent Referral sent to community care guide requesting resources provided in January related to financial insecurity and utilities be resent to patient   Diabetes:  (Status: Goal on track: NO.) Long Term Goal - patient states she is taking Oxempic  as prescribed  Lab Results  Component Value Date        HGBA1C                                                       10.8                                  05/27/22      HGBA1C                                                      12.2                                  08/25/22      HGBA1C                                                       9.7                                   11/11/22  Provided education to patient about basic DM disease process; Reviewed medications with patient and discussed importance of medication adherence;        Reviewed prescribed diet with patient CHO controlled; Discussed plans with patient for ongoing care management follow up and provided patient with direct contact information for care management team;   Hyperlipidemia:  (Status: Condition stable. Not addressed this visit.) Long Term Goal  Lab Results  Component Value Date   CHOL 113 08/21/2020   HDL 44.70 08/21/2020   LDLCALC 52 08/21/2020   TRIG 80.0 08/21/2020   CHOLHDL 3 08/21/2020    Medication review performed; medication list updated in electronic medical record.   Weight Loss:  (Status: Goal on track: NO.) Long term Goal-  [facility] would [facility] Reviewed DM metabolic deficits including decreased satiety signaling to brain from intestines and newer medication Tirzepatide used to treat DM/ lipid/BP . Encouraged patient to discuss referral to [facility] to assist her with weight management and glycemic control when she sees her primary care provider  Patient Goals/Self-Care Activities: Take medications as prescribed   Attend all scheduled provider appointments Call pharmacy for medication  refills 3-7 days in advance of running out of medications Perform all self care activities independently  Perform IADL's (shopping, preparing meals, housekeeping, managing finances) independently Call provider office for new concerns or questions  set goal weight fill half of plate with vegetables manage portion size switch to  sugar-free drinks - call for medicine refill 2 or 3 days before it runs out - take all medications exactly as prescribed - call doctor with any symptoms you believe are related to your medicine - call doctor when you experience any new symptoms - go to all doctor appointments as scheduled - adhere to prescribed diet: CHO controlled - discuss [facility] with primary care provider Patient to follow up on test strip-completed Patient to contact pharmacy/PCP/GYN regarding medications-completed Patient to call PCP office to f/u on ENDO referral placed 08/25/22-completed

## 2023-02-16 ENCOUNTER — Encounter: Payer: Self-pay | Admitting: "Endocrinology

## 2023-02-16 ENCOUNTER — Ambulatory Visit (INDEPENDENT_AMBULATORY_CARE_PROVIDER_SITE_OTHER): Payer: Medicaid Other | Admitting: "Endocrinology

## 2023-02-16 VITALS — BP 132/90 | HR 67 | Ht 69.0 in | Wt 240.8 lb

## 2023-02-16 DIAGNOSIS — Z7984 Long term (current) use of oral hypoglycemic drugs: Secondary | ICD-10-CM | POA: Diagnosis not present

## 2023-02-16 DIAGNOSIS — E1140 Type 2 diabetes mellitus with diabetic neuropathy, unspecified: Secondary | ICD-10-CM

## 2023-02-16 DIAGNOSIS — E1165 Type 2 diabetes mellitus with hyperglycemia: Secondary | ICD-10-CM

## 2023-02-16 DIAGNOSIS — E782 Mixed hyperlipidemia: Secondary | ICD-10-CM

## 2023-02-16 DIAGNOSIS — Z7985 Long-term (current) use of injectable non-insulin antidiabetic drugs: Secondary | ICD-10-CM | POA: Diagnosis not present

## 2023-02-16 LAB — POCT GLYCOSYLATED HEMOGLOBIN (HGB A1C): Hemoglobin A1C: 6.8 % — AB (ref 4.0–5.6)

## 2023-02-16 LAB — GLUCOSE, POCT (MANUAL RESULT ENTRY): POC Glucose: 118 mg/dL — AB (ref 70–99)

## 2023-02-16 MED ORDER — SEMAGLUTIDE (1 MG/DOSE) 4 MG/3ML ~~LOC~~ SOPN
1.0000 mg | PEN_INJECTOR | SUBCUTANEOUS | 3 refills | Status: DC
Start: 2023-02-16 — End: 2023-05-17

## 2023-02-16 MED ORDER — METFORMIN HCL 1000 MG PO TABS: 1000.0000 mg | ORAL_TABLET | Freq: Two times a day (BID) | ORAL | 3 refills | Status: DC

## 2023-02-16 NOTE — Progress Notes (Signed)
Outpatient Endocrinology Note Altamese Sublimity, MD  02/16/23   Angelica Berg Sep 04, 1975 [id number]  Referring Provider: Philip Aspen, Almira Bar* Primary Care Provider: Philip Aspen, Limmie Patricia, MD Reason for consultation: Subjective   Assessment & Plan  Diagnoses and all orders for this visit:  Type 2 diabetes mellitus with diabetic neuropathy, without long-term current use of insulin (HCC) -     POCT Glucose (CBG) -     POCT glycosylated hemoglobin (Hb A1C) -     metFORMIN (GLUCOPHAGE) 1000 MG tablet; Take 1 tablet (1,000 mg total) by mouth 2 (two) times daily with a meal. -     Semaglutide, 1 MG/DOSE, 4 MG/3ML SOPN; Inject 1 mg as directed once a week.  Long term (current) use of oral hypoglycemic drugs  Long-term (current) use of injectable non-insulin antidiabetic drugs  Mixed hypercholesterolemia and hypertriglyceridemia    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, neuropathy  Lab Results  Component Value Date   GFR 108.48 11/04/2022   Hba1c goal less than 7, current Hba1c is  Lab Results  Component Value Date   HGBA1C 6.8 (A) 02/16/2023   Will recommend the following: Couldn't tolerate Metformin XR 500 mg 2 pills twice a day due to "diarrhea and abdominal pain", patient tolerates regular metformin 1000 mg better  Ozempic 1 mg weekly-has nausea sometimes   No known contraindications to any of above medications  -Last LD and Tg are as follows: Lab Results  Component Value Date   LDLCALC 72 11/25/2021    Lab Results  Component Value Date   TRIG 236.0 (H) 11/04/2022   -On atorvastatin 80 mg QD -Follow low fat diet and exercise   -Blood pressure goal <140/90 - Microalbumin/creatinine goal is < 30 -Last MA/Cr is as follows: Lab Results  Component Value Date   MICROALBUR 4.9 (H) 11/04/2022   -on ACE/ARB lisinopril 5 mg every day  -diet changes including salt restriction -limit eating outside -counseled BP targets per standards of  diabetes care -uncontrolled blood pressure can lead to retinopathy, nephropathy and cardiovascular and atherosclerotic heart disease  Reviewed and counseled on: -A1C target -Blood sugar targets -Complications of uncontrolled diabetes  -Checking blood sugar before meals and bedtime and bring log next visit -All medications with mechanism of action and side effects -Hypoglycemia management: rule of 15's, Glucagon Emergency Kit and medical alert ID -low-carb low-fat plate-method diet -At least 20 minutes of physical activity per day -Annual dilated retinal eye exam and foot exam -compliance and follow up needs -follow up as scheduled or earlier if problem gets worse  Call if blood sugar is less than 70 or consistently above 250    Take a 15 gm snack of carbohydrate at bedtime before you go to sleep if your blood sugar is less than 100.    If you are going to fast after midnight for a test or procedure, ask your physician for instructions on how to reduce/decrease your insulin dose.    Call if blood sugar is less than 70 or consistently above 250  -Treating a low sugar by rule of 15  (15 gms of sugar every 15 min until sugar is more than 70) If you feel your sugar is low, test your sugar to be sure If your sugar is low (less than 70), then take 15 grams of a fast acting Carbohydrate (3-4 glucose tablets or glucose gel or 4 ounces of juice or regular soda) Recheck your sugar 15 min after treating low to make sure  it is more than 70 If sugar is still less than 70, treat again with 15 grams of carbohydrate          Don't drive the hour of hypoglycemia  If unconscious/unable to eat or drink by mouth, use glucagon injection or nasal spray baqsimi and call 911. Can repeat again in 15 min if still unconscious.  Return in about 3 months (around 05/19/2023).   I have reviewed current medications, nurse's notes, allergies, vital signs, past medical and surgical history, family medical history,  and social history for this encounter. Counseled patient on symptoms, examination findings, lab findings, imaging results, treatment decisions and monitoring and prognosis. The patient understood the recommendations and agrees with the treatment plan. All questions regarding treatment plan were fully answered.  Altamese [place], MD  02/16/23    History of Present Illness [name] is a 47 y.o. year old female who presents for evaluation of Type II diabetes mellitus.  Angelica Berg was first diagnosed in 07/2018.   Diabetes education +  Home diabetes regimen: Couldn't tolerate Metformin XR 500 mg 2 pills twice a day due to "diarrhea and abdominal pain", patient tolerates regular metformin 1000 mg better  Ozempic 1 mg weekly-has some nausea   Prior Medications tried/Intolerance: Started on Metformin and Glipizide   COMPLICATIONS -  MI/Stroke -  retinopathy +  neuropathy -  nephropathy  BLOOD SUGAR DATA Did not bring meter Checks fasting   Physical Exam  BP (!) 132/90   Pulse 67   Ht 5\' 9"  (1.753 m)   Wt 240 lb 12.8 oz (109.2 kg)   LMP  (LMP Unknown)   SpO2 96%   BMI 35.56 kg/m    Constitutional: well developed, well nourished Head: normocephalic, atraumatic Eyes: sclera anicteric, no redness Neck: supple Lungs: normal respiratory effort Neurology: alert and oriented Skin: dry, no appreciable rashes Musculoskeletal: no appreciable defects Psychiatric: normal mood and affect Diabetic Foot Exam - Simple   No data filed      Current Medications Patient's Medications  New Prescriptions   METFORMIN (GLUCOPHAGE) 1000 MG TABLET    Take 1 tablet (1,000 mg total) by mouth 2 (two) times daily with a meal.  Previous Medications   AMLODIPINE (NORVASC) 5 MG TABLET    Take 1 tablet (5 mg total) by mouth daily.   ATORVASTATIN (LIPITOR) 80 MG TABLET    Take 1 tablet (80 mg total) by mouth daily.   BLOOD GLUCOSE MONITORING SUPPL (ACCU-CHEK GUIDE) W/DEVICE KIT    1 Device  by Does not apply route daily. E11.9   BLOOD GLUCOSE MONITORING SUPPL (ONETOUCH VERIO) W/DEVICE KIT    1 Device by Does not apply route as directed.   ESTRADIOL (ESTRACE) 0.1 MG/GM VAGINAL CREAM    Insert one gram into vagina twice weekly.   GLUCOSE BLOOD (ACCU-CHEK GUIDE) TEST STRIP    Use as instructed to test blood sugar 2 times daily E11.9   IBUPROFEN (ADVIL) 800 MG TABLET    TAKE 1 TABLET BY MOUTH EVERY 8 HOURS AS NEEDED   LISINOPRIL (ZESTRIL) 5 MG TABLET    TAKE 1 TABLET BY MOUTH DAILY  Modified Medications   Modified Medication Previous Medication   SEMAGLUTIDE, 1 MG/DOSE, 4 MG/3ML SOPN Semaglutide, 1 MG/DOSE, 4 MG/3ML SOPN      Inject 1 mg as directed once a week.    Inject 1 mg as directed once a week.  Discontinued Medications   METFORMIN (GLUCOPHAGE-XR) 500 MG 24 HR TABLET  Take 2 tablets (1,000 mg total) by mouth 2 (two) times daily with a meal.    Allergies No Known Allergies  Past Medical History Past Medical History:  Diagnosis Date   Diabetes mellitus (HCC)    Dyslipidemia     Past Surgical History Past Surgical History:  Procedure Laterality Date   CESAREAN SECTION     KNEE ARTHROCENTESIS      Family History family history includes Cancer in her maternal grandmother; Diabetes in her mother; Heart disease in her maternal grandmother; Hypertension in her mother.  Social History Social History   Socioeconomic History   Marital status: Single    Spouse name: Not on file   Number of children: Not on file   Years of education: Not on file   Highest education level: Not on file  Occupational History   Not on file  Tobacco Use   Smoking status: Former    Current packs/day: 0.00    Types: Cigarettes    Quit date: 06/12/2018    Years since quitting: 4.6   Smokeless tobacco: Never  Vaping Use   Vaping status: Never Used  Substance and Sexual Activity   Alcohol use: Yes    Comment: occ   Drug use: Yes    Frequency: 7.0 times per week    Types:  Marijuana   Sexual activity: Not Currently    Partners: Male    Birth control/protection: Post-menopausal  Other Topics Concern   Not on file  Social History Narrative   04/22/21- Single mom of 70 year old twin girls   Social Determinants of Health   Financial Resource Strain: Medium Risk (10/19/2022)   Overall Financial Resource Strain (CARDIA)    Difficulty of Paying Living Expenses: Somewhat hard  Food Insecurity: Food Insecurity Present (10/19/2022)   Hunger Vital Sign    Worried About Running Out of Food in the Last Year: Sometimes true    Ran Out of Food in the Last Year: Sometimes true  Transportation Needs: No Transportation Needs (09/16/2022)   PRAPARE - Administrator, Civil Service (Medical): No    Lack of Transportation (Non-Medical): No  Physical Activity: Inactive (08/17/2022)   Exercise Vital Sign    Days of Exercise per Week: 0 days    Minutes of Exercise per Session: 0 min  Stress: No Stress Concern Present (08/17/2022)   [employer] of Occupational Health - Occupational Stress Questionnaire    Feeling of Stress : Only a little  Social Connections: Socially Isolated (02/15/2023)   Social Connection and Isolation Panel [NHANES]    Frequency of Communication with Friends and Family: More than three times a week    Frequency of Social Gatherings with Friends and Family: More than three times a week    Attends Religious Services: Never    Database administrator or Organizations: No    Attends Banker Meetings: Never    Marital Status: Never married  Intimate Partner Violence: Not At Risk (09/16/2022)   Humiliation, Afraid, Rape, and Kick questionnaire    Fear of Current or Ex-Partner: No    Emotionally Abused: No    Physically Abused: No    Sexually Abused: No    Lab Results  Component Value Date   HGBA1C 6.8 (A) 02/16/2023   HGBA1C 9.7 (H) 11/04/2022   HGBA1C 12.2 (A) 08/25/2022   Lab Results  Component Value Date   CHOL 133  11/04/2022   Lab Results  Component Value Date  HDL 49.20 11/04/2022   Lab Results  Component Value Date   LDLCALC 72 11/25/2021   Lab Results  Component Value Date   TRIG 236.0 (H) 11/04/2022   Lab Results  Component Value Date   CHOLHDL 3 11/04/2022   Lab Results  Component Value Date   CREATININE 0.60 11/04/2022   Lab Results  Component Value Date   GFR 108.48 11/04/2022   Lab Results  Component Value Date   MICROALBUR 4.9 (H) 11/04/2022      Component Value Date/Time   NA 139 11/04/2022 1006   K 3.8 11/04/2022 1006   CL 103 11/04/2022 1006   CO2 27 11/04/2022 1006   GLUCOSE 215 (H) 11/04/2022 1006   GLUCOSE 83 03/15/2008 0535   BUN 7 11/04/2022 1006   CREATININE 0.60 11/04/2022 1006   CREATININE 0.66 02/15/2020 0939   CALCIUM 9.2 11/04/2022 1006   PROT 7.4 11/04/2022 1006   ALBUMIN 4.3 11/04/2022 1006   AST 20 11/04/2022 1006   ALT 32 11/04/2022 1006   ALKPHOS 60 11/04/2022 1006   BILITOT 1.5 (H) 11/04/2022 1006   GFRNONAA >60 04/07/2008 0450   GFRAA  04/07/2008 0450    >60        The eGFR has been calculated using the MDRD equation. This calculation has not been validated in all clinical      Latest Ref Rng & Units 11/04/2022   10:06 AM 11/25/2021    9:29 AM 08/21/2020    9:45 AM  BMP  Glucose 70 - 99 mg/dL 191  478  295   BUN 6 - 23 mg/dL 7  7  7    Creatinine 0.40 - 1.20 mg/dL 6.21  3.08  6.57   Sodium 135 - 145 mEq/L 139  137  140   Potassium 3.5 - 5.1 mEq/L 3.8  3.9  4.3   Chloride 96 - 112 mEq/L 103  98  105   CO2 19 - 32 mEq/L 27  31  28    Calcium 8.4 - 10.5 mg/dL 9.2  9.4  8.7        Component Value Date/Time   WBC 5.0 11/25/2021 0929   RBC 4.45 11/25/2021 0929   HGB 13.7 11/25/2021 0929   HCT 40.1 11/25/2021 0929   PLT 263.0 11/25/2021 0929   MCV 90.1 11/25/2021 0929   MCH 30.1 08/11/2018 1036   MCHC 34.1 11/25/2021 0929   RDW 12.9 11/25/2021 0929   LYMPHSABS 2.3 11/25/2021 0929   MONOABS 0.4 11/25/2021 0929   EOSABS 0.1  11/25/2021 0929   BASOSABS 0.0 11/25/2021 0929     Parts of this note may have been dictated using voice recognition software. There may be variances in spelling and vocabulary which are unintentional. Not all errors are proofread. Please notify the Thereasa Parkin if any discrepancies are noted or if the meaning of any statement is not clear.

## 2023-02-16 NOTE — Patient Instructions (Signed)

## 2023-02-22 ENCOUNTER — Ambulatory Visit: Payer: Medicaid Other

## 2023-02-22 ENCOUNTER — Ambulatory Visit (INDEPENDENT_AMBULATORY_CARE_PROVIDER_SITE_OTHER): Payer: Medicaid Other | Admitting: Internal Medicine

## 2023-02-22 ENCOUNTER — Encounter: Payer: Self-pay | Admitting: Internal Medicine

## 2023-02-22 VITALS — BP 120/88 | Temp 98.1°F | Wt 232.9 lb

## 2023-02-22 DIAGNOSIS — M25511 Pain in right shoulder: Secondary | ICD-10-CM

## 2023-02-22 DIAGNOSIS — M542 Cervicalgia: Secondary | ICD-10-CM

## 2023-02-22 MED ORDER — MELOXICAM 7.5 MG PO TABS
7.5000 mg | ORAL_TABLET | Freq: Every day | ORAL | 0 refills | Status: DC
Start: 2023-02-22 — End: 2023-04-26

## 2023-02-22 NOTE — Progress Notes (Signed)
Established Patient Office Visit     CC/Reason for Visit: Right shoulder and neck pain  HPI: Angelica Berg is a 47 y.o. female who is coming in today for the above mentioned reasons.  Has been present for about 3 weeks.  She is a Interior and spatial designer.  She is constantly raising her arms.  She has been experiencing pain and difficulty with frontal and lateral abduction of the right shoulder.  No injury that she can recall.  She feels like her shoulder is deformed.  Pain extends up the trapezius and neck area.   Past Medical/Surgical History: Past Medical History:  Diagnosis Date   Diabetes mellitus (HCC)    Dyslipidemia     Past Surgical History:  Procedure Laterality Date   CESAREAN SECTION     KNEE ARTHROCENTESIS      Social History:  reports that she quit smoking about 4 years ago. Her smoking use included cigarettes. She has never used smokeless tobacco. She reports current alcohol use. She reports current drug use. Frequency: 7.00 times per week. Drug: Marijuana.  Allergies: No Known Allergies  Family History:  Family History  Problem Relation Age of Onset   Diabetes Mother    Hypertension Mother    Cancer Maternal Grandmother        throat   Heart disease Maternal Grandmother      Current Outpatient Medications:    atorvastatin (LIPITOR) 80 MG tablet, Take 1 tablet (80 mg total) by mouth daily., Disp: 90 tablet, Rfl: 1   Blood Glucose Monitoring Suppl (ACCU-CHEK GUIDE) w/Device KIT, 1 Device by Does not apply route daily. E11.9, Disp: 1 kit, Rfl: 0   Blood Glucose Monitoring Suppl (ONETOUCH VERIO) w/Device KIT, 1 Device by Does not apply route as directed., Disp: 1 kit, Rfl: 0   glucose blood (ACCU-CHEK GUIDE) test strip, Use as instructed to test blood sugar 2 times daily E11.9, Disp: 100 each, Rfl: 12   ibuprofen (ADVIL) 800 MG tablet, TAKE 1 TABLET BY MOUTH EVERY 8 HOURS AS NEEDED, Disp: 90 tablet, Rfl: 0   lisinopril (ZESTRIL) 5 MG tablet, TAKE 1 TABLET BY  MOUTH DAILY, Disp: 90 tablet, Rfl: 1   meloxicam (MOBIC) 7.5 MG tablet, Take 1 tablet (7.5 mg total) by mouth daily., Disp: 30 tablet, Rfl: 0   metFORMIN (GLUCOPHAGE) 1000 MG tablet, Take 1 tablet (1,000 mg total) by mouth 2 (two) times daily with a meal., Disp: 60 tablet, Rfl: 3   Semaglutide, 1 MG/DOSE, 4 MG/3ML SOPN, Inject 1 mg as directed once a week., Disp: 3 mL, Rfl: 3   amLODipine (NORVASC) 5 MG tablet, Take 1 tablet (5 mg total) by mouth daily. (Patient not taking: Reported on 11/25/2022), Disp: 90 tablet, Rfl: 1   estradiol (ESTRACE) 0.1 MG/GM vaginal cream, Insert one gram into vagina twice weekly. (Patient not taking: Reported on 02/16/2023), Disp: 42.5 g, Rfl: 2  Review of Systems:  Negative unless indicated in HPI.   Physical Exam: Vitals:   02/22/23 1121  BP: 120/88  Temp: 98.1 F (36.7 C)  TempSrc: Oral  Weight: 232 lb 14.4 oz (105.6 kg)    Body mass index is 34.39 kg/m.   Physical Exam Musculoskeletal:     Right shoulder: Deformity present. Decreased range of motion.     Left shoulder: Normal.      Impression and Plan:  Acute pain of right shoulder -     Meloxicam; Take 1 tablet (7.5 mg total) by mouth daily.  Dispense:  30 tablet; Refill: 0 -     DG Shoulder Right; Future  Neck pain on right side -     Meloxicam; Take 1 tablet (7.5 mg total) by mouth daily.  Dispense: 30 tablet; Refill: 0   -Suspect this is likely to be musculoskeletal also have advised meloxicam, icing, local massage therapy. 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  Time spent:31 minutes reviewing chart, interviewing and examining patient and formulating plan of care.     Chaya Jan, MD [facility]

## 2023-02-25 ENCOUNTER — Ambulatory Visit: Payer: Medicaid Other | Admitting: Internal Medicine

## 2023-02-28 ENCOUNTER — Other Ambulatory Visit: Payer: Self-pay | Admitting: Internal Medicine

## 2023-03-12 LAB — HM DIABETES EYE EXAM

## 2023-03-19 ENCOUNTER — Other Ambulatory Visit: Payer: Self-pay | Admitting: Obstetrics and Gynecology

## 2023-03-19 NOTE — Patient Outreach (Signed)
  Medicaid Managed Care   Unsuccessful Attempt Note   03/19/2023 Name: Angelica Berg MRN: [id number] DOB: 02-02-1976  Referred by: Philip Aspen, Limmie Patricia, MD Reason for referral : High Risk Managed Medicaid (Unsuccessful telephone outreach)  An unsuccessful telephone outreach was attempted today. The patient was referred to the case management team for assistance with care management and care coordination.    Follow Up Plan: The Managed Medicaid care management team will reach out to the patient again over the next 30 business  days. and The  Patient has been provided with contact information for the Managed Medicaid care management team and has been advised to call with any health related questions or concerns.   [name] RN, BSN [place]  [employer], production - Managed Medicaid High Risk [phone]

## 2023-03-19 NOTE — Patient Instructions (Signed)
Hi Ms. Isidoro, I am sorry I missed you this morning-I hope you are doing okay- as a part of your Medicaid benefit, you are eligible for care management and care coordination services at no cost or copay. I was unable to reach you by phone today but would be happy to help you with your health related needs. Please feel free to call me at [phone].  A member of the Managed Medicaid care management team will reach out to you again over the next 30 business days.   [name] RN, BSN [place]  [employer] production - Managed Medicaid High Risk [phone]

## 2023-04-05 ENCOUNTER — Other Ambulatory Visit: Payer: Self-pay | Admitting: Obstetrics and Gynecology

## 2023-04-05 NOTE — Patient Instructions (Signed)
Hi Angelica Berg, I hope your shoulder feels better-have a good day!  Angelica Berg was given information about Medicaid Managed Care team care coordination services as a part of their [facility] Community Plan Medicaid benefit. Angelica Berg verbally consented to engagement with the [facility] Managed Care team.   If you are experiencing a medical emergency, please call 911 or report to your local emergency department or urgent care.   If you have a non-emergency medical problem during routine business hours, please contact your provider's office and ask to speak with a nurse.   For questions related to your [facility], please call: [phone] or visit the homepage here: [email or web address]  If you would like to schedule transportation through your [facility], please call the following number at least 2 days in advance of your appointment: [phone]   Rides for urgent appointments can also be made after hours by calling Member Services.  Call the Behavioral Health Crisis Line at [phone], at any time, 24 hours a day, 7 days a week. If you are in danger or need immediate medical attention call 911.  If you would like help to quit smoking, call 1-800-QUIT-NOW ((250) 472-1040) OR Espaol: 1-855-Djelo-Ya (4-166-063-0160) o para ms informacin haga clic aqu or Text READY to 109-323 to register via text  Angelica Berg - following are the goals we discussed in your visit today:   Goals Addressed    Timeframe:  Long-Range Goal Priority:  High Start Date:     08/18/21                        Expected End Date: ongoing                      Follow Up Date: 05/06/23  - practice safe sex - schedule appointment for flu shot - schedule appointment for vaccines needed due to my age or health - schedule recommended health tests (blood work, mammogram, colonoscopy, pap test) - schedule and  keep appointment for annual check-up    Why is this important?   Screening tests can find diseases early when they are easier to treat.  Your doctor or nurse will talk with you about which tests are important for you.  Getting shots for common diseases like the flu and shingles will help prevent them.   04/04/24:  To f/u with PCP regarding right shoulder pain  Patient verbalizes understanding of instructions and care plan provided today and agrees to view in MyChart. Active MyChart status and patient understanding of how to access instructions and care plan via MyChart confirmed with patient.     The Managed Medicaid care management team will reach out to the patient again over the next 30 business  days.  The  Patient   has been provided with contact information for the Managed Medicaid care management team and has been advised to call with any health related questions or concerns.   [name] RN, BSN [place]  [facility] Care Management Coordinator - Managed Medicaid High Risk [phone]   Following is a copy of your plan of care:  Care Plan : RN Care Manager Plan Of Care  Updates made by [name], RN since 04/05/2023 12:00 AM     Problem: Knowledge Deficit and Care Coordination Needs Related to Management of  DM, obesity and HLD   Priority: High     Long-Range Goal: Development  of Plan Of Care to Address Care Coordination Needs and Knowledge Deficits for Management of DM, obesity, HLD   Start Date: 04/22/2021  Expected End Date: 05/18/2023  Priority: High  Note:   Current Barriers:  Knowledge Deficits related to plan of care for management of HLD, DMII, HTN and Obesity  04/04/24:  Taking Metformin and Ozempic-checks blood sugar once in the morning.  Average is 150s per patient.  Continues to complain of right shoulder pain.  Xray WNL.  Taking Meloxicam with some relief.    RNCM Clinical Goal(s):  Patient will verbalize basic understanding of HLD, DMII,  and obesity disease process and self health management plan as evidenced by improved Hgb A1C, weight loss or no weight gain, normal lipid panel take all medications exactly as prescribed and will call provider for medication related questions as evidenced by patient reporting improvement in taken medications as prescribed    demonstrate understanding of rationale for each prescribed medication as evidenced by patient able to report to provider purpose of medication    attend all scheduled medical appointments: with primary care provider     demonstrate improved adherence to prescribed treatment plan for HLD, DMII, and Obesity as evidenced by improved Hgb A1C, weight loss of no weight gain, improvement in lipid panel continue to work with Medical illustrator and/or Social Worker to address care management and care coordination needs related to HLD, DMII, and Obesity as evidenced by adherence to CM Team Scheduled appointments  through collaboration with RN Care manager, provider, and care team.  Work with pharmacist to address inconsistent medication taking behavior and personal feeling regarding medications. i.e. "I don't like taking so many medications" and appropriateness of adding Tirzepatide to medication regimen in place of Trulicity  Interventions: Inter-disciplinary care team collaboration (see longitudinal plan of care) Evaluation of current treatment plan related to  self management and patient's adherence to plan as established by provider Care guide referral for dental resources-completed. Collaborated with Care Guide for resources. Encouraged patient to take all medications and keep ENDO appt.  To follow up with provider for needed referrals-completed 04/05/23:  patient to f/u with PCP regarding right shoulder pain.    Hypertension Interventions:  (Status:  New goal.) Long Term Goal Last practice recorded BP readings:  BP Readings from Last 3 Encounters:        02/22/23      120/88   O5/07/24             124/79 11/11/22              120/75  Most recent eGFR/CrCl: No results found for: "EGFR"  No components found for: "CRCL"  Evaluation of current treatment plan related to hypertension self management and patient's adherence to plan as established by provider Reviewed medications with patient and discussed importance of compliance Discussed plans with patient for ongoing care management follow up and provided patient with direct contact information for care management team Advised patient, providing education and rationale, to monitor blood pressure daily and record, calling PCP for findings outside established parameters Reviewed scheduled/upcoming provider appointments  Assessed social determinant of health barriers   Interdisciplinary Collaboration Interventions:  (Status: Goal on track:  NO.) Long Term Goal  - Collaborated with BSW to initiate plan of care to address needs related to Financial constraints related to limited income, Limited access to food, and Lacks knowledge of community resource: to address food and financial insecurity and dental resources  in patient with HLD, DMII, and  Obesity Collaboration with Philip Aspen, Limmie Patricia, MD regarding development and update of comprehensive plan of care as evidenced by provider attestation and co-signature Inter-disciplinary care team collaboration   Referral placed with the pharmacist to address medication taking behavior and patient's personal feelings of "I don't like taking so many medications"  Schedule initial telephone appointment with Managed Medicaid BSW [name] to address financial insecurities and patient's inability to pay rent Referral sent to community care guide requesting resources provided in January related to financial insecurity and utilities be resent to patient   Diabetes:  (Status: Goal on track: NO.) Long Term Goal - patient states she is taking Oxempic and Metformin  as  prescribed  Lab Results  Component Value Date        HGBA1C                                                       6.8                                   02/22/23      HGBA1C                                                      12.2                                  08/25/22      HGBA1C                                                       9.7                                   11/11/22  Provided education to patient about basic DM disease process; Reviewed medications with patient and discussed importance of medication adherence;        Reviewed prescribed diet with patient CHO controlled; Discussed plans with patient for ongoing care management follow up and provided patient with direct contact information for care management team;   Hyperlipidemia:  (Status: Condition stable. Not addressed this visit.) Long Term Goal  Lab Results  Component Value Date   CHOL 113 08/21/2020   HDL 44.70 08/21/2020   LDLCALC 52 08/21/2020   TRIG 80.0 08/21/2020   CHOLHDL 3 08/21/2020    Medication review performed; medication list updated in electronic medical record.   Weight Loss:  (Status: Goal on track: NO.) Long term Goal-  [facility] would [facility] to [facility] Reviewed DM metabolic deficits including decreased satiety signaling to brain from intestines and newer medication Tirzepatide used to treat DM/ lipid/BP . Encouraged patient to discuss referral to [facility] to assist her with weight management and glycemic control when she sees her  primary care provider  Patient Goals/Self-Care Activities: Take medications as prescribed   Attend all scheduled provider appointments Call pharmacy for medication refills 3-7 days in advance of running out of medications Perform all self care activities independently  Perform IADL's (shopping, preparing meals, housekeeping, managing finances) independently Call provider office for new concerns or  questions  set goal weight fill half of plate with vegetables manage portion size switch to sugar-free drinks - call for medicine refill 2 or 3 days before it runs out - take all medications exactly as prescribed - call doctor with any symptoms you believe are related to your medicine - call doctor when you experience any new symptoms - go to all doctor appointments as scheduled - adhere to prescribed diet: CHO controlled - discuss [facility] to [facility] with primary care provider Patient to follow up on test strip-completed Patient to contact pharmacy/PCP/GYN regarding medications-completed Patient to call PCP office to f/u on ENDO referral placed 08/25/22-completed

## 2023-04-05 NOTE — Patient Outreach (Signed)
Medicaid Managed Care   Nurse Care Manager Note  04/05/2023 Name:  Angelica Berg MRN:  [id number] DOB:  [birth year]  Angelica Berg is an 47 y.o. year old female who is a primary patient of Philip Aspen, Limmie Patricia, MD.  The [facility] Managed Care Coordination team was consulted for assistance with:    Chronic healthcare management needs, HTN, DM, HLD  Ms. Tenbusch was given information about Medicaid Managed Care Coordination team services today. Angelica Berg Patient agreed to services and verbal consent obtained.  Engaged with patient by telephone for follow up visit in response to provider referral for case management and/or care coordination services.   Patient is participating in a Managed Medicaid Plan:  Yes  Assessments/Interventions:  Review of past medical history, allergies, medications, health status, including review of consultants reports, laboratory and other test data, was performed as part of comprehensive evaluation and provision of chronic care management services.  SDOH (Social Drivers of Health) assessments and interventions performed: SDOH Interventions    Flowsheet Row Patient Outreach Telephone from 04/05/2023 in [place] POPULATION HEALTH DEPARTMENT Patient Outreach Telephone from 02/15/2023 in [place] POPULATION HEALTH DEPARTMENT Patient Outreach Telephone from 11/19/2022 in [place] POPULATION HEALTH DEPARTMENT Patient Outreach Telephone from 10/19/2022 in [place] POPULATION HEALTH DEPARTMENT Patient Outreach Telephone from 09/16/2022 in [place] POPULATION HEALTH DEPARTMENT Patient Outreach Telephone from 08/17/2022 in [place] POPULATION HEALTH DEPARTMENT  SDOH Interventions        Food Insecurity Interventions -- -- -- --  [referrals have been placed] -- --  Housing Interventions -- Intervention Not Indicated -- -- -- --  Transportation Interventions -- -- -- -- Intervention Not Indicated --  Utilities Interventions Intervention Not Indicated  -- -- -- -- --  Alcohol Usage Interventions Intervention Not Indicated (Score <7) -- -- -- -- --  Financial Strain Interventions -- -- -- Other (Comment)  [patient has been given resources] -- --  Physical Activity Interventions -- -- -- -- -- Patient Refused  Stress Interventions -- -- -- -- -- Intervention Not Indicated  Social Connections Interventions -- Intervention Not Indicated -- -- -- --  Health Literacy Interventions -- -- Intervention Not Indicated -- -- --     Care Plan No Known Allergies  Medications Reviewed Today     Reviewed by [name], RN (Registered Nurse) on 04/05/23 at [phone]  Med List Status: <None>   Medication Order Taking? Sig Documenting Provider Last Dose Status Informant  amLODipine (NORVASC) 5 MG tablet [id number] No Take 1 tablet (5 mg total) by mouth daily.  Patient not taking: Reported on 11/25/2022   Philip Aspen, Limmie Patricia, MD Not Taking Active   atorvastatin (LIPITOR) 80 MG tablet [id number] No Take 1 tablet (80 mg total) by mouth daily. Philip Aspen, Limmie Patricia, MD Taking Active   Blood Glucose Monitoring Suppl (ACCU-CHEK GUIDE) w/Device KIT [id number] No 1 Device by Does not apply route daily. E11.9 [name], Konrad Dolores, MD Taking Active            Med Note Clearance Coots, Mel Almond Aug 04, 2021 11:03 AM)    Blood Glucose Monitoring Suppl [facility] VERIO) w/Device KIT [id number] No 1 Device by Does not apply route as directed. [name], Konrad Dolores, MD Taking Active   estradiol (ESTRACE) 0.1 MG/GM vaginal cream [id number] No Insert one gram into vagina twice weekly.  Patient not taking: Reported on 02/16/2023   [name], MD Not Taking Active   glucose blood (ACCU-CHEK  GUIDE) test strip [id number] No Use as instructed to test blood sugar 2 times daily E11.9 Philip Aspen, Limmie Patricia, MD Taking Active   ibuprofen (ADVIL) 800 MG tablet [id number]  TAKE 1 TABLET BY MOUTH EVERY 8 HOURS AS NEEDED Philip Aspen, Limmie Patricia, MD   Active   lisinopril (ZESTRIL) 5 MG tablet [id number] No TAKE 1 TABLET BY MOUTH DAILY Philip Aspen, Limmie Patricia, MD Taking Active   meloxicam (MOBIC) 7.5 MG tablet [id number]  Take 1 tablet (7.5 mg total) by mouth daily. Philip Aspen, Limmie Patricia, MD  Active   metFORMIN (GLUCOPHAGE) 1000 MG tablet [id number] No Take 1 tablet (1,000 mg total) by mouth 2 (two) times daily with a meal. Motwani, Komal, MD Taking Active   Semaglutide, 1 MG/DOSE, 4 MG/3ML SOPN [id number] No Inject 1 mg as directed once a week. Altamese Rowley, MD Taking Active            Patient Active Problem List   Diagnosis Date Noted   Non-adherence to medical treatment 08/25/2022   Hypertension associated with diabetes (HCC) 02/25/2022   Pain in left foot 12/31/2021   Pain in left knee 05/23/2019   Type 2 diabetes mellitus with diabetic nephropathy, without long-term current use of insulin (HCC) 08/30/2018   Morbid obesity (HCC) 08/30/2018   Vitamin D deficiency 08/30/2018   Hyperlipidemia associated with type 2 diabetes mellitus (HCC) 08/30/2018   Transaminitis 08/30/2018   Conditions to be addressed/monitored per PCP order:  Chronic healthcare management needs, HTN, DM, HLD  Care Plan : RN Care Manager Plan Of Care  Updates made by [name], RN since 04/05/2023 12:00 AM     Problem: Knowledge Deficit and Care Coordination Needs Related to Management of  DM, obesity and HLD   Priority: High     Long-Range Goal: Development of Plan Of Care to Address Care Coordination Needs and Knowledge Deficits for Management of DM, obesity, HLD   Start Date: 04/22/2021  Expected End Date: 05/18/2023  Priority: High  Note:   Current Barriers:  Knowledge Deficits related to plan of care for management of HLD, DMII, HTN and Obesity  04/04/24:  Taking Metformin and Ozempic-checks blood sugar once in the morning.  Average is 150s per patient.  Continues to complain of right shoulder pain.  Xray WNL.  Taking Meloxicam with some  relief.    RNCM Clinical Goal(s):  Patient will verbalize basic understanding of HLD, DMII, and obesity disease process and self health management plan as evidenced by improved Hgb A1C, weight loss or no weight gain, normal lipid panel take all medications exactly as prescribed and will call provider for medication related questions as evidenced by patient reporting improvement in taken medications as prescribed    demonstrate understanding of rationale for each prescribed medication as evidenced by patient able to report to provider purpose of medication    attend all scheduled medical appointments: with primary care provider     demonstrate improved adherence to prescribed treatment plan for HLD, DMII, and Obesity as evidenced by improved Hgb A1C, weight loss of no weight gain, improvement in lipid panel continue to work with Medical illustrator and/or Social Worker to address care management and care coordination needs related to HLD, DMII, and Obesity as evidenced by adherence to CM Team Scheduled appointments  through collaboration with RN Care manager, provider, and care team.  Work with pharmacist to address inconsistent medication taking behavior and personal feeling regarding medications. i.e. "I don't like taking so many medications"  and appropriateness of adding Tirzepatide to medication regimen in place of Trulicity  Interventions: Inter-disciplinary care team collaboration (see longitudinal plan of care) Evaluation of current treatment plan related to  self management and patient's adherence to plan as established by provider Care guide referral for dental resources-completed. Collaborated with Care Guide for resources. Encouraged patient to take all medications and keep ENDO appt.  To follow up with provider for needed referrals-completed 04/05/23:  patient to f/u with PCP regarding right shoulder pain.    Hypertension Interventions:  (Status:  New goal.) Long Term Goal Last practice  recorded BP readings:  BP Readings from Last 3 Encounters:        02/22/23      120/88  O5/07/24             124/79 11/11/22              120/75  Most recent eGFR/CrCl: No results found for: "EGFR"  No components found for: "CRCL"  Evaluation of current treatment plan related to hypertension self management and patient's adherence to plan as established by provider Reviewed medications with patient and discussed importance of compliance Discussed plans with patient for ongoing care management follow up and provided patient with direct contact information for care management team Advised patient, providing education and rationale, to monitor blood pressure daily and record, calling PCP for findings outside established parameters Reviewed scheduled/upcoming provider appointments  Assessed social determinant of health barriers   Interdisciplinary Collaboration Interventions:  (Status: Goal on track:  NO.) Long Term Goal  - Collaborated with BSW to initiate plan of care to address needs related to Financial constraints related to limited income, Limited access to food, and Lacks knowledge of community resource: to address food and financial insecurity and dental resources  in patient with HLD, DMII, and Obesity Collaboration with Philip Aspen, Limmie Patricia, MD regarding development and update of comprehensive plan of care as evidenced by provider attestation and co-signature Inter-disciplinary care team collaboration   Referral placed with the pharmacist to address medication taking behavior and patient's personal feelings of "I don't like taking so many medications"  Schedule initial telephone appointment with Managed Medicaid BSW [name] to address financial insecurities and patient's inability to pay rent Referral sent to community care guide requesting resources provided in January related to financial insecurity and utilities be resent to patient   Diabetes:  (Status: Goal on track:  NO.) Long Term Goal - patient states she is taking Oxempic and Metformin  as prescribed  Lab Results  Component Value Date        HGBA1C                                                       6.8                                   02/22/23      HGBA1C                                                      12.2  08/25/22      HGBA1C                                                       9.7                                   11/11/22  Provided education to patient about basic DM disease process; Reviewed medications with patient and discussed importance of medication adherence;        Reviewed prescribed diet with patient CHO controlled; Discussed plans with patient for ongoing care management follow up and provided patient with direct contact information for care management team;   Hyperlipidemia:  (Status: Condition stable. Not addressed this visit.) Long Term Goal  Lab Results  Component Value Date   CHOL 113 08/21/2020   HDL 44.70 08/21/2020   LDLCALC 52 08/21/2020   TRIG 80.0 08/21/2020   CHOLHDL 3 08/21/2020    Medication review performed; medication list updated in electronic medical record.   Weight Loss:  (Status: Goal on track: NO.) Long term Goal-  Patient would like [facility] to [facility] Reviewed DM metabolic deficits including decreased satiety signaling to brain from intestines and newer medication Tirzepatide used to treat DM/ lipid/BP . Encouraged patient to discuss referral to [place] to assist her with weight management and glycemic control when she sees her primary care provider  Patient Goals/Self-Care Activities: Take medications as prescribed   Attend all scheduled provider appointments Call pharmacy for medication refills 3-7 days in advance of running out of medications Perform all self care activities independently  Perform IADL's (shopping, preparing meals,  housekeeping, managing finances) independently Call provider office for new concerns or questions  set goal weight fill half of plate with vegetables manage portion size switch to sugar-free drinks - call for medicine refill 2 or 3 days before it runs out - take all medications exactly as prescribed - call doctor with any symptoms you believe are related to your medicine - call doctor when you experience any new symptoms - go to all doctor appointments as scheduled - adhere to prescribed diet: CHO controlled - discuss [facility] with primary care provider Patient to follow up on test strip-completed Patient to contact pharmacy/PCP/GYN regarding medications-completed Patient to call PCP office to f/u on ENDO referral placed 08/25/22-completed    Long-Range Goal: Establish Plan of Care for Chronic Disease Management Needs   Priority: High  Note:   Timeframe:  Long-Range Goal Priority:  High Start Date:     08/18/21                        Expected End Date: ongoing                      Follow Up Date: 05/06/23  - practice safe sex - schedule appointment for flu shot - schedule appointment for vaccines needed due to my age or health - schedule recommended health tests (blood work, mammogram, colonoscopy, pap test) - schedule and keep appointment for annual check-up    Why is this important?   Screening tests can find diseases early when they are  easier to treat.  Your doctor or nurse will talk with you about which tests are important for you.  Getting shots for common diseases like the flu and shingles will help prevent them.   04/04/24:  To f/u with PCP regarding right shoulder pain    Follow Up:  Patient agrees to Care Plan and Follow-up.  Plan: The Managed Medicaid care management team will reach out to the patient again over the next 30 business  days. and The  Patient has been provided with contact information for the Managed Medicaid care  management team and has been advised to call with any health related questions or concerns.  Date/time of next scheduled RN care management/care coordination outreach:  05/06/23 at 0900.

## 2023-04-15 ENCOUNTER — Other Ambulatory Visit: Payer: Self-pay | Admitting: Internal Medicine

## 2023-04-15 DIAGNOSIS — M542 Cervicalgia: Secondary | ICD-10-CM

## 2023-04-15 DIAGNOSIS — M25511 Pain in right shoulder: Secondary | ICD-10-CM

## 2023-05-06 ENCOUNTER — Other Ambulatory Visit: Payer: Self-pay | Admitting: Obstetrics and Gynecology

## 2023-05-06 NOTE — Patient Instructions (Signed)
Hi Ms. Ortez, I hope you and family are well- as a part of your Medicaid benefit, you are eligible for care management and care coordination services at no cost or copay. I was unable to reach you by phone today but would be happy to help you with your health related needs. Please feel free to call me at [phone].   A member of the Managed Medicaid care management team will reach out to you again over the next 30 business  days.   [name] RN, BSN, [employer] [facility] RN Care Manager Direct Dial [phone]/NUU [phone] Website: [email or web address]

## 2023-05-06 NOTE — Patient Outreach (Signed)
  Medicaid Managed Care   Unsuccessful Attempt Note   05/06/2023 Name: Angelica Berg MRN: [id number] DOB: 02/16/1976  Referred by: Philip Aspen, Limmie Patricia, MD Reason for referral : High Risk Managed Medicaid (Unsuccessful telephone outreach)  An unsuccessful telephone outreach was attempted today. The patient was referred to the case management team for assistance with care management and care coordination.    Follow Up Plan: The Managed Medicaid care management team will reach out to the patient again over the next 30 business  days. and The  Patient has been provided with contact information for the Managed Medicaid care management team and has been advised to call with any health related questions or concerns.   [name] RN, BSN, [employer] [facility] RN Care Manager Direct Dial [phone]/WGN [phone] Website: [email or web address]

## 2023-05-12 ENCOUNTER — Other Ambulatory Visit: Payer: Self-pay | Admitting: Obstetrics and Gynecology

## 2023-05-12 NOTE — Patient Instructions (Signed)
Visit Information  Angelica Berg was given information about Medicaid Managed Care team care coordination services as a part of their [facility] Community Plan Medicaid benefit. Angelica Berg verbally consented to engagement with the [facility] Managed Care team.   If you are experiencing a medical emergency, please call 911 or report to your local emergency department or urgent care.   If you have a non-emergency medical problem during routine business hours, please contact your provider's office and ask to speak with a nurse.   For questions related to your [facility], please call: [phone] or visit the homepage here: [email or web address]  If you would like to schedule transportation through your [facility], please call the following number at least 2 days in advance of your appointment: [phone]   Rides for urgent appointments can also be made after hours by calling Member Services.  Call the Behavioral Health Crisis Line at [phone], at any time, 24 hours a day, 7 days a week. If you are in danger or need immediate medical attention call 911.  If you would like help to quit smoking, call 1-800-QUIT-NOW ((805)260-5655) OR Espaol: 1-855-Djelo-Ya (1-324-401-0272) o para ms informacin haga clic aqu or Text READY to 536-644 to register via text  Angelica Berg - following are the goals we discussed in your visit today:   Goals Addressed    Timeframe:  Long-Range Goal Priority:  High Start Date:     08/18/21                        Expected End Date: ongoing                      Follow Up Date: 06/15/23  - practice safe sex - schedule appointment for flu shot - schedule appointment for vaccines needed due to my age or health - schedule recommended health tests (blood work, mammogram, colonoscopy, pap test) - schedule and keep appointment for annual check-up    Why is  this important?   Screening tests can find diseases early when they are easier to treat.  Your doctor or nurse will talk with you about which tests are important for you.  Getting shots for common diseases like the flu and shingles will help prevent them.   05/12/23:  ENDO appt 1/27  Patient verbalizes understanding of instructions and care plan provided today and agrees to view in MyChart. Active MyChart status and patient understanding of how to access instructions and care plan via MyChart confirmed with patient.     The Managed Medicaid care management team will reach out to the patient again over the next 30 business  days.  The  Patient  has been provided with contact information for the Managed Medicaid care management team and has been advised to call with any health related questions or concerns.   Angelica Der RN, BSN, [employer] [facility] Health RN Care Manager Direct Dial [phone]/LOV [phone] Website: [email or web address]   Following is a copy of your plan of care:  Care Plan : RN Care Manager Plan Of Care  Updates made by Angelica Chandler, RN since 05/12/2023 12:00 AM     Problem: Knowledge Deficit and Care Coordination Needs Related to Management of  DM, obesity and HLD   Priority: High     Long-Range Goal: Development of Plan Of Care to Address Care Coordination Needs and Knowledge Deficits for Management of DM,  obesity, HLD   Start Date: 04/22/2021  Expected End Date: 08/10/2023  Priority: High  Note:   Current Barriers:  Knowledge Deficits related to plan of care for management of HLD, DMII, HTN and Obesity  05/12/23:  ENDO appt 1/27.  Blood sugar averages 140-150 per patient.  Right shoulder pain some better.  RNCM Clinical Goal(s):  Patient will verbalize basic understanding of HLD, DMII, and obesity disease process and self health management plan as evidenced by improved Hgb A1C, weight loss or no weight gain, normal lipid panel take all  medications exactly as prescribed and will call provider for medication related questions as evidenced by patient reporting improvement in taken medications as prescribed    demonstrate understanding of rationale for each prescribed medication as evidenced by patient able to report to provider purpose of medication    attend all scheduled medical appointments: with primary care provider     demonstrate improved adherence to prescribed treatment plan for HLD, DMII, and Obesity as evidenced by improved Hgb A1C, weight loss of no weight gain, improvement in lipid panel continue to work with Medical illustrator and/or Social Worker to address care management and care coordination needs related to HLD, DMII, and Obesity as evidenced by adherence to CM Team Scheduled appointments  through collaboration with RN Care manager, provider, and care team.  Work with pharmacist to address inconsistent medication taking behavior and personal feeling regarding medications. i.e. "I don't like taking so many medications" and appropriateness of adding Tirzepatide to medication regimen in place of Trulicity  Interventions: Inter-disciplinary care team collaboration (see longitudinal plan of care) Evaluation of current treatment plan related to  self management and patient's adherence to plan as established by provider Care guide referral for dental resources-completed. Collaborated with Care Guide for resources. Encouraged patient to take all medications and keep ENDO appt.  To follow up with provider for needed referrals-completed 04/05/23:  patient to f/u with PCP regarding right shoulder pain.    Hypertension Interventions:  (Status:  New goal.) Long Term Goal Last practice recorded BP readings:  BP Readings from Last 3 Encounters:        02/22/23      120/88  O5/07/24             124/79 11/11/22              120/75  Most recent eGFR/CrCl: No results found for: "EGFR"  No components found for:  "CRCL"  Evaluation of current treatment plan related to hypertension self management and patient's adherence to plan as established by provider Reviewed medications with patient and discussed importance of compliance Discussed plans with patient for ongoing care management follow up and provided patient with direct contact information for care management team Advised patient, providing education and rationale, to monitor blood pressure daily and record, calling PCP for findings outside established parameters Reviewed scheduled/upcoming provider appointments  Assessed social determinant of health barriers   Interdisciplinary Collaboration Interventions:  (Status: Goal on track:  NO.) Long Term Goal  - Collaborated with BSW to initiate plan of care to address needs related to Financial constraints related to limited income, Limited access to food, and Lacks knowledge of community resource: to address food and financial insecurity and dental resources  in patient with HLD, DMII, and Obesity Collaboration with Angelica Berg, Angelica Patricia, Angelica Berg regarding development and update of comprehensive plan of care as evidenced by provider attestation and co-signature Inter-disciplinary care team collaboration   Referral placed with the pharmacist  to address medication taking behavior and patient's personal feelings of "I don't like taking so many medications"  Schedule initial telephone appointment with Managed Medicaid BSW Angelica Berg to address financial insecurities and patient's inability to pay rent Referral sent to community care guide requesting resources provided in January related to financial insecurity and utilities be resent to patient   Diabetes:  (Status: Goal on track: NO.) Long Term Goal - patient states she is taking Oxempic and Metformin  as prescribed  Lab Results  Component Value Date        HGBA1C                                                       6.8                                    02/22/23      HGBA1C                                                      12.2                                  08/25/22      HGBA1C                                                       9.7                                   11/11/22  Provided education to patient about basic DM disease process; Reviewed medications with patient and discussed importance of medication adherence;        Reviewed prescribed diet with patient CHO controlled; Discussed plans with patient for ongoing care management follow up and provided patient with direct contact information for care management team;   Hyperlipidemia:  (Status: Condition stable. Not addressed this visit.) Long Term Goal  Lab Results  Component Value Date   CHOL 113 08/21/2020   HDL 44.70 08/21/2020   LDLCALC 52 08/21/2020   TRIG 80.0 08/21/2020   CHOLHDL 3 08/21/2020    Medication review performed; medication list updated in electronic medical record.   Weight Loss:  (Status: Goal on track: NO.) Long term Goal-  [facility] would [facility] Reviewed DM metabolic deficits including decreased satiety signaling to brain from intestines and newer medication Tirzepatide used to treat DM/ lipid/BP . Encouraged patient to discuss referral to [facility] to assist her with weight management and glycemic control when she sees her primary care provider  Patient Goals/Self-Care Activities: Take medications as prescribed   Attend all scheduled provider appointments Call pharmacy for medication refills 3-7 days in advance of running out of medications Perform all self  care activities independently  Perform IADL's (shopping, preparing meals, housekeeping, managing finances) independently Call provider office for new concerns or questions  set goal weight fill half of plate with vegetables manage portion size switch to sugar-free drinks - call for medicine refill 2 or 3 days  before it runs out - take all medications exactly as prescribed - call doctor with any symptoms you believe are related to your medicine - call doctor when you experience any new symptoms - go to all doctor appointments as scheduled - adhere to prescribed diet: CHO controlled - discuss referral to [facility] with primary care provider Patient to follow up on test strip-completed Patient to contact pharmacy/PCP/GYN regarding medications-completed Patient to call PCP office to f/u on ENDO referral placed 08/25/22-completed

## 2023-05-12 NOTE — Patient Outreach (Signed)
Medicaid Managed Care   Nurse Care Manager Note  05/12/2023 Name:  Angelica Berg MRN:  [id number] DOB:  [birth year]  Angelica Berg is an 48 y.o. year old female who is a primary patient of Angelica Berg, Angelica Patricia, MD.  The [facility] Managed Care Coordination team was consulted for assistance with:    Chronic healthcare management needs, HLD, DM.  Angelica Berg was given information about Medicaid Managed Care Coordination team services today. Angelica Berg Patient agreed to services and verbal consent obtained.  Engaged with patient by telephone for follow up visit in response to provider referral for case management and/or care coordination services.   Patient is participating in a Managed Medicaid Plan:  Yes  Assessments/Interventions:  Review of past medical history, allergies, medications, health status, including review of consultants reports, laboratory and other test data, was performed as part of comprehensive evaluation and provision of chronic care management services.  SDOH (Social Drivers of Health) assessments and interventions performed: SDOH Interventions    Flowsheet Row Patient Outreach Telephone from 05/12/2023 in [place] [facility] POP[facility]ALTH DEPARTMENT Patient Outreach Telephone from 04/05/2023 in [place] POPULATION HEALTH DEPARTMENT Patient Outreach Telephone from 02/15/2023 in [place] POPULATION HEALTH DEPARTMENT Patient Outreach Telephone from 11/19/2022 in [place] POPULATION HEALTH DEPARTMENT Patient Outreach Telephone from 10/19/2022 in [place] POPULATION HEALTH DEPARTMENT Patient Outreach Telephone from 09/16/2022 in [place] POPULATION HEALTH DEPARTMENT  SDOH Interventions        Food Insecurity Interventions -- -- -- -- --  [referrals have been placed] --  Housing Interventions -- -- Intervention Not Indicated -- -- --  Transportation Interventions -- -- -- -- -- Intervention Not Indicated  Utilities Interventions -- Intervention Not Indicated --  -- -- --  Alcohol Usage Interventions -- Intervention Not Indicated (Score <7) -- -- -- --  Financial Strain Interventions -- -- -- -- Other (Comment)  [patient has been given resources] --  Physical Activity Interventions Intervention Not Indicated, Other (Comments)  [declined] -- -- -- -- --  Stress Interventions Intervention Not Indicated -- -- -- -- --  Social Connections Interventions -- -- Intervention Not Indicated -- -- --  Health Literacy Interventions -- -- -- Intervention Not Indicated -- --     Care Plan No Known Allergies  Medications Reviewed Today     Reviewed by [name], RN (Registered Nurse) on 05/12/23 at 1519  Med List Status: <None>   Medication Order Taking? Sig Documenting Provider Last Dose Status Informant  amLODipine (NORVASC) 5 MG tablet [id number] No Take 1 tablet (5 mg total) by mouth daily.  Patient not taking: Reported on 11/25/2022   Angelica Berg, Angelica Patricia, MD Not Taking Active   atorvastatin (LIPITOR) 80 MG tablet [id number] No Take 1 tablet (80 mg total) by mouth daily. Angelica Berg, Angelica Patricia, MD Taking Active   Blood Glucose Monitoring Suppl (ACCU-CHEK GUIDE) w/Device KIT [id number] No 1 Device by Does not apply route daily. E11.9 Berg, Angelica Dolores, MD Taking Active            Med Note Clearance Coots, Mel Almond Aug 04, 2021 11:03 AM)    Blood Glucose Monitoring Suppl [facility] VERIO) w/Device KIT [id number] No 1 Device by Does not apply route as directed. Berg, Angelica Dolores, MD Taking Active   estradiol (ESTRACE) 0.1 MG/GM vaginal cream [id number] No Insert one gram into vagina twice weekly.  Patient not taking: Reported on 02/16/2023   Angelica Del, MD Not Taking Active  glucose blood (ACCU-CHEK GUIDE) test strip [id number] No Use as instructed to test blood sugar 2 times daily E11.9 Angelica Berg, Angelica Patricia, MD Taking Active   ibuprofen (ADVIL) 800 MG tablet [id number]  TAKE 1 TABLET BY MOUTH EVERY 8 HOURS AS NEEDED  Angelica Berg, Angelica Patricia, MD  Active   lisinopril (ZESTRIL) 5 MG tablet [id number] No TAKE 1 TABLET BY MOUTH DAILY Angelica Berg, Angelica Patricia, MD Taking Active   meloxicam (MOBIC) 7.5 MG tablet [id number]  TAKE 1 TABLET BY MOUTH EVERY DAY Angelica Berg, Angelica Patricia, MD  Active   metFORMIN (GLUCOPHAGE) 1000 MG tablet [id number] No Take 1 tablet (1,000 mg total) by mouth 2 (two) times daily with a meal. Motwani, Komal, MD Taking Active   Semaglutide, 1 MG/DOSE, 4 MG/3ML SOPN [id number] No Inject 1 mg as directed once a week. Angelica [place], MD Taking Active            Patient Active Problem List   Diagnosis Date Noted   Non-adherence to medical treatment 08/25/2022   Hypertension associated with diabetes (HCC) 02/25/2022   Pain in left foot 12/31/2021   Pain in left knee 05/23/2019   Type 2 diabetes mellitus with diabetic nephropathy, without long-term current use of insulin (HCC) 08/30/2018   Morbid obesity (HCC) 08/30/2018   Vitamin D deficiency 08/30/2018   Hyperlipidemia associated with type 2 diabetes mellitus (HCC) 08/30/2018   Transaminitis 08/30/2018   Conditions to be addressed/monitored per PCP order:  Chronic healthcare management needs, HLD, DM.  Care Plan : RN Care Manager Plan Of Care  Updates made by [name], RN since 05/12/2023 12:00 AM     Problem: Knowledge Deficit and Care Coordination Needs Related to Management of  DM, obesity and HLD   Priority: High     Long-Range Goal: Development of Plan Of Care to Address Care Coordination Needs and Knowledge Deficits for Management of DM, obesity, HLD   Start Date: 04/22/2021  Expected End Date: 08/10/2023  Priority: High  Note:   Current Barriers:  Knowledge Deficits related to plan of care for management of HLD, DMII, HTN and Obesity  05/12/23:  ENDO appt 1/27.  Blood sugar averages 140-150 per patient.  Right shoulder pain some better.  RNCM Clinical Goal(s):  Patient will verbalize basic understanding of HLD,  DMII, and obesity disease process and self health management plan as evidenced by improved Hgb A1C, weight loss or no weight gain, normal lipid panel take all medications exactly as prescribed and will call provider for medication related questions as evidenced by patient reporting improvement in taken medications as prescribed    demonstrate understanding of rationale for each prescribed medication as evidenced by patient able to report to provider purpose of medication    attend all scheduled medical appointments: with primary care provider     demonstrate improved adherence to prescribed treatment plan for HLD, DMII, and Obesity as evidenced by improved Hgb A1C, weight loss of no weight gain, improvement in lipid panel continue to work with Medical illustrator and/or Social Worker to address care management and care coordination needs related to HLD, DMII, and Obesity as evidenced by adherence to CM Team Scheduled appointments  through collaboration with RN Care manager, provider, and care team.  Work with pharmacist to address inconsistent medication taking behavior and personal feeling regarding medications. i.e. "I don't like taking so many medications" and appropriateness of adding Tirzepatide to medication regimen in place of Trulicity  Interventions: Inter-disciplinary care team collaboration (see  longitudinal plan of care) Evaluation of current treatment plan related to  self management and patient's adherence to plan as established by provider Care guide referral for dental resources-completed. Collaborated with Care Guide for resources. Encouraged patient to take all medications and keep ENDO appt.  To follow up with provider for needed referrals-completed 04/05/23:  patient to f/u with PCP regarding right shoulder pain.    Hypertension Interventions:  (Status:  New goal.) Long Term Goal Last practice recorded BP readings:  BP Readings from Last 3 Encounters:        02/22/23      120/88   O5/07/24             124/79 11/11/22              120/75  Most recent eGFR/CrCl: No results found for: "EGFR"  No components found for: "CRCL"  Evaluation of current treatment plan related to hypertension self management and patient's adherence to plan as established by provider Reviewed medications with patient and discussed importance of compliance Discussed plans with patient for ongoing care management follow up and provided patient with direct contact information for care management team Advised patient, providing education and rationale, to monitor blood pressure daily and record, calling PCP for findings outside established parameters Reviewed scheduled/upcoming provider appointments  Assessed social determinant of health barriers   Interdisciplinary Collaboration Interventions:  (Status: Goal on track:  NO.) Long Term Goal  - Collaborated with BSW to initiate plan of care to address needs related to Financial constraints related to limited income, Limited access to food, and Lacks knowledge of community resource: to address food and financial insecurity and dental resources  in patient with HLD, DMII, and Obesity Collaboration with Angelica Berg, Angelica Patricia, MD regarding development and update of comprehensive plan of care as evidenced by provider attestation and co-signature Inter-disciplinary care team collaboration   Referral placed with the pharmacist to address medication taking behavior and patient's personal feelings of "I don't like taking so many medications"  Schedule initial telephone appointment with Managed Medicaid BSW [name] to address financial insecurities and patient's inability to pay rent Referral sent to community care guide requesting resources provided in January related to financial insecurity and utilities be resent to patient   Diabetes:  (Status: Goal on track: NO.) Long Term Goal - patient states she is taking Oxempic and Metformin  as  prescribed  Lab Results  Component Value Date        HGBA1C                                                       6.8                                   02/22/23      HGBA1C                                                      12.2  08/25/22      HGBA1C                                                       9.7                                   11/11/22  Provided education to patient about basic DM disease process; Reviewed medications with patient and discussed importance of medication adherence;        Reviewed prescribed diet with patient CHO controlled; Discussed plans with patient for ongoing care management follow up and provided patient with direct contact information for care management team;   Hyperlipidemia:  (Status: Condition stable. Not addressed this visit.) Long Term Goal  Lab Results  Component Value Date   CHOL 113 08/21/2020   HDL 44.70 08/21/2020   LDLCALC 52 08/21/2020   TRIG 80.0 08/21/2020   CHOLHDL 3 08/21/2020    Medication review performed; medication list updated in electronic medical record.   Weight Loss:  (Status: Goal on track: NO.) Long term Goal-  [facility] would like [facility] Reviewed DM metabolic deficits including decreased satiety signaling to brain from intestines and newer medication Tirzepatide used to treat DM/ lipid/BP . Encouraged patient to discuss referral to [facility] to assist her with weight management and glycemic control when she sees her primary care provider  Patient Goals/Self-Care Activities: Take medications as prescribed   Attend all scheduled provider appointments Call pharmacy for medication refills 3-7 days in advance of running out of medications Perform all self care activities independently  Perform IADL's (shopping, preparing meals, housekeeping, managing finances) independently Call provider office for new concerns or  questions  set goal weight fill half of plate with vegetables manage portion size switch to sugar-free drinks - call for medicine refill 2 or 3 days before it runs out - take all medications exactly as prescribed - call doctor with any symptoms you believe are related to your medicine - call doctor when you experience any new symptoms - go to all doctor appointments as scheduled - adhere to prescribed diet: CHO controlled - discuss referral to [facility] with primary care provider Patient to follow up on test strip-completed Patient to contact pharmacy/PCP/GYN regarding medications-completed Patient to call PCP office to f/u on ENDO referral placed 08/25/22-completed   Long-Range Goal: Establish Plan of Care for Chronic Disease Management Needs   Priority: High  Note:   Timeframe:  Long-Range Goal Priority:  High Start Date:     08/18/21                        Expected End Date: ongoing                      Follow Up Date: 06/15/23  - practice safe sex - schedule appointment for flu shot - schedule appointment for vaccines needed due to my age or health - schedule recommended health tests (blood work, mammogram, colonoscopy, pap test) - schedule and keep appointment for annual check-up    Why is this important?   Screening tests can find diseases early when they are easier  to treat.  Your doctor or nurse will talk with you about which tests are important for you.  Getting shots for common diseases like the flu and shingles will help prevent them.   05/12/23:  ENDO appt 1/27   Follow Up:  Patient agrees to Care Plan and Follow-up.  Plan: The Managed Medicaid care management team will reach out to the patient again over the next 30 business  days. and The  Patient has been provided with contact information for the Managed Medicaid care management team and has been advised to call with any health related questions or concerns.  Date/time of next scheduled RN  care management/care coordination outreach:  06/15/23 at [phone].

## 2023-05-17 ENCOUNTER — Encounter: Payer: Self-pay | Admitting: "Endocrinology

## 2023-05-17 ENCOUNTER — Ambulatory Visit (INDEPENDENT_AMBULATORY_CARE_PROVIDER_SITE_OTHER): Payer: Medicaid Other | Admitting: "Endocrinology

## 2023-05-17 VITALS — BP 124/70 | HR 76 | Ht 69.0 in | Wt 226.8 lb

## 2023-05-17 DIAGNOSIS — Z7984 Long term (current) use of oral hypoglycemic drugs: Secondary | ICD-10-CM | POA: Diagnosis not present

## 2023-05-17 DIAGNOSIS — E782 Mixed hyperlipidemia: Secondary | ICD-10-CM

## 2023-05-17 DIAGNOSIS — Z7985 Long-term (current) use of injectable non-insulin antidiabetic drugs: Secondary | ICD-10-CM | POA: Diagnosis not present

## 2023-05-17 DIAGNOSIS — E1165 Type 2 diabetes mellitus with hyperglycemia: Secondary | ICD-10-CM | POA: Diagnosis not present

## 2023-05-17 LAB — POCT GLYCOSYLATED HEMOGLOBIN (HGB A1C): Hemoglobin A1C: 7 % — AB (ref 4.0–5.6)

## 2023-05-17 MED ORDER — OZEMPIC (2 MG/DOSE) 8 MG/3ML ~~LOC~~ SOPN: 2.0000 mg | PEN_INJECTOR | SUBCUTANEOUS | 1 refills | Status: DC

## 2023-05-17 NOTE — Progress Notes (Signed)
Outpatient Endocrinology Note [place], MD  05/17/23   KIMYAH FREIN [birth year] [id number]  Referring Provider: Philip Aspen, [place]ira [place]* Primary Care Provider: Philip Aspen, Limmie Patricia, MD Reason for consultation: Subjective   Assessment & Plan  Diagnoses and all orders for this visit:  Uncontrolled type 2 diabetes mellitus with hyperglycemia (HCC) -     POCT glycosylated hemoglobin (Hb A1C) -     Semaglutide, 2 MG/DOSE, (OZEMPIC, 2 MG/DOSE,) 8 MG/3ML SOPN; Inject 2 mg into the skin once a week. 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, neuropathy  Lab Results  Component Value Date   GFR 108.48 11/04/2022   Hba1c goal less than 7, current Hba1c is  Lab Results  Component Value Date   HGBA1C 7.0 (A) 05/17/2023   Will recommend the following: Resume metformin 1000 mg at bedtime and gradually increase to 1000 mg bid  Ozempic 2 mg weekly  Stopped metformin 1000 mg at bedtime a week ago, had no S/E (was not taking it at night) Couldn't tolerate Metformin XR 500 mg 2 pills twice a day due to "diarrhea and abdominal pain", patient tolerates regular metformin 1000 mg better   No known contraindications to any of above medications  -Last LD and Tg are as follows: Lab Results  Component Value Date   LDLCALC 72 11/25/2021    Lab Results  Component Value Date   TRIG 236.0 (H) 11/04/2022   -On atorvastatin 80 mg QD -Follow low fat diet and exercise   -Blood pressure goal <140/90 - Microalbumin/creatinine goal is < 30 -Last MA/Cr is as follows: Lab Results  Component Value Date   MICROALBUR 4.9 (H) 11/04/2022   -on ACE/ARB lisinopril 5 mg every day   -diet changes including salt restriction -limit eating outside -counseled BP targets per standards of diabetes care -uncontrolled blood pressure can lead to retinopathy, nephropathy and cardiovascular and atherosclerotic heart disease  Reviewed and counseled on: -A1C target -Blood sugar targets -Complications of uncontrolled diabetes  -Checking blood sugar before meals and bedtime and bring log next visit -All medications with mechanism of action and side effects -Hypoglycemia management: rule of 15's, Glucagon Emergency Kit and medical alert ID -low-carb low-fat plate-method diet -At least 20 minutes of physical activity per day -Annual dilated retinal eye exam and foot exam -compliance and follow up needs -follow up as scheduled or earlier if problem gets worse  Call if blood sugar is less than 70 or consistently above 250    Take a 15 gm snack of carbohydrate at bedtime before you go to sleep if your blood sugar is less than 100.    If you are going to fast after midnight for a test or procedure, ask your physician for instructions on how to reduce/decrease your insulin dose.    Call if blood sugar is less than 70 or consistently above 250  -Treating a low sugar by rule of 15  (15 gms of sugar every 15 min until sugar is more than 70) If you feel your sugar is low, test your sugar to be sure If your sugar is low (less than 70), then take 15 grams of a fast acting  Carbohydrate (3-4 glucose tablets or glucose gel or 4 ounces of juice or regular soda) Recheck your sugar 15 min after treating low to make sure it is more than 70 If sugar is still less than 70, treat again with 15 grams of carbohydrate          Don't drive the hour of hypoglycemia  If unconscious/unable to eat or drink by mouth, use glucagon injection or nasal spray baqsimi and call 911. Can repeat again in 15 min if still unconscious.  Return in about 3 months (around 08/15/2023) for visit and 8 am labs before next  visit.   I have reviewed current medications, nurse's notes, allergies, vital signs, past medical and surgical history, family medical history, and social history for this encounter. Counseled patient on symptoms, examination findings, lab findings, imaging results, treatment decisions and monitoring and prognosis. The patient understood the recommendations and agrees with the treatment plan. All questions regarding treatment plan were fully answered.  [place], MD  05/17/23   History of Present Illness FALISHA OSMENT is a 48 y.o. year old female who presents for follow up of Type II diabetes mellitus.  ZSAZSA BAHENA was first diagnosed in 07/2018.   Diabetes education +  Home diabetes regimen: Stopped metformin, was only taking 1000 mg qpm Ozempic 1 mg weekly-has some nausea   Prior Medications tried/Intolerance: Started on Metformin and Glipizide  Couldn't tolerate Metformin XR 500 mg 2 pills twice a day due to "diarrhea and abdominal pain", patient tolerates regular metformin 1000 mg better   COMPLICATIONS -  MI/Stroke -  retinopathy +  neuropathy -  nephropathy  BLOOD SUGAR DATA Did not bring meter Checks fasting/after supper Range 134-178  Physical Exam  BP 124/70   Pulse 76   Ht 5\' 9"  (1.753 m)   Wt 226 lb 12.8 oz (102.9 kg)   LMP  (LMP Unknown)   BMI 33.49 kg/m    Constitutional: well developed, well nourished Head: normocephalic, atraumatic Eyes: sclera anicteric, no redness Neck: supple Lungs: normal respiratory effort Neurology: alert and oriented Skin: dry, no appreciable rashes Musculoskeletal: no appreciable defects Psychiatric: normal mood and affect Diabetic Foot Exam - Simple   No data filed      Current Medications Patient's Medications  New Prescriptions   SEMAGLUTIDE, 2 MG/DOSE, (OZEMPIC, 2 MG/DOSE,) 8 MG/3ML SOPN    Inject 2 mg into the skin once a week.  Previous Medications   AMLODIPINE (NORVASC) 5 MG TABLET    Take 1 tablet  (5 mg total) by mouth daily.   ATORVASTATIN (LIPITOR) 80 MG TABLET    Take 1 tablet (80 mg total) by mouth daily.   BLOOD GLUCOSE MONITORING SUPPL (ACCU-CHEK GUIDE) W/DEVICE KIT    1 Device by Does not apply route daily. E11.9   BLOOD GLUCOSE MONITORING SUPPL (ONETOUCH VERIO) W/DEVICE KIT    1 Device by Does not apply route as directed.   ESTRADIOL (ESTRACE) 0.1 MG/GM VAGINAL CREAM    Insert one gram into vagina twice weekly.   GLUCOSE BLOOD (ACCU-CHEK GUIDE) TEST STRIP    Use as instructed to test blood sugar 2 times daily E11.9   IBUPROFEN (ADVIL) 800 MG TABLET    TAKE 1 TABLET BY MOUTH EVERY 8 HOURS AS NEEDED   LISINOPRIL (ZESTRIL) 5 MG TABLET    TAKE 1 TABLET BY MOUTH DAILY   MELOXICAM (MOBIC) 7.5 MG TABLET    TAKE 1 TABLET BY MOUTH EVERY DAY   METFORMIN (GLUCOPHAGE) 1000  MG TABLET    Take 1 tablet (1,000 mg total) by mouth 2 (two) times daily with a meal.  Modified Medications   No medications on file  Discontinued Medications   SEMAGLUTIDE, 1 MG/DOSE, 4 MG/3ML SOPN    Inject 1 mg as directed once a week.    Allergies No Known Allergies  Past Medical History Past Medical History:  Diagnosis Date   Diabetes mellitus (HCC)    Dyslipidemia     Past Surgical History Past Surgical History:  Procedure Laterality Date   CESAREAN SECTION     KNEE ARTHROCENTESIS      Family History family history includes Cancer in her maternal grandmother; Diabetes in her mother; Heart disease in her maternal grandmother; Hypertension in her mother.  Social History Social History   Socioeconomic History   Marital status: Single    Spouse name: Not on file   Number of children: Not on file   Years of education: Not on file   Highest education level: Not on file  Occupational History   Not on file  Tobacco Use   Smoking status: Former    Current packs/day: 0.00    Types: Cigarettes    Quit date: 06/12/2018    Years since quitting: 4.9   Smokeless tobacco: Never  Vaping Use   Vaping  status: Never Used  Substance and Sexual Activity   Alcohol use: Yes    Comment: occ   Drug use: Yes    Frequency: 7.0 times per week    Types: Marijuana   Sexual activity: Not Currently    Partners: Male    Birth control/protection: Post-menopausal  Other Topics Concern   Not on file  Social History Narrative   04/22/21- Single mom of 27 year old twin girls   Social Drivers of Health   Financial Resource Strain: Medium Risk (10/19/2022)   Overall Financial Resource Strain (CARDIA)    Difficulty of Paying Living Expenses: Somewhat hard  Food Insecurity: Food Insecurity Present (10/19/2022)   Hunger Vital Sign    Worried About Running Out of Food in the Last Year: Sometimes true    Ran Out of Food in the Last Year: Sometimes true  Transportation Needs: No Transportation Needs (09/16/2022)   PRAPARE - Administrator, Civil Service (Medical): No    Lack of Transportation (Non-Medical): No  Physical Activity: Inactive (05/12/2023)   Exercise Vital Sign    Days of Exercise per Week: 0 days    Minutes of Exercise per Session: 0 min  Stress: No Stress Concern Present (05/12/2023)   [employer] of Occupational Health - Occupational Stress Questionnaire    Feeling of Stress : Only a little  Social Connections: Unknown (02/15/2023)   Social Connection and Isolation Panel [NHANES]    Frequency of Communication with Friends and Family: More than three times a week    Frequency of Social Gatherings with Friends and Family: More than three times a week    Attends Religious Services: Not on Marketing executive or Organizations: No    Attends Banker Meetings: Never    Marital Status: Never married  Recent Concern: Social Connections - Socially Isolated (02/15/2023)   Social Connection and Isolation Panel [NHANES]    Frequency of Communication with Friends and Family: More than three times a week    Frequency of Social Gatherings with Friends and Family:  More than three times a week    Attends Religious Services:  Never    Active Member of Clubs or Organizations: No    Attends Banker Meetings: Never    Marital Status: Never married  Intimate Partner Violence: Not At Risk (09/16/2022)   Humiliation, Afraid, Rape, and Kick questionnaire    Fear of Current or Ex-Partner: No    Emotionally Abused: No    Physically Abused: No    Sexually Abused: No    Lab Results  Component Value Date   HGBA1C 7.0 (A) 05/17/2023   HGBA1C 6.8 (A) 02/16/2023   HGBA1C 9.7 (H) 11/04/2022   Lab Results  Component Value Date   CHOL 133 11/04/2022   Lab Results  Component Value Date   HDL 49.20 11/04/2022   Lab Results  Component Value Date   LDLCALC 72 11/25/2021   Lab Results  Component Value Date   TRIG 236.0 (H) 11/04/2022   Lab Results  Component Value Date   CHOLHDL 3 11/04/2022   Lab Results  Component Value Date   CREATININE 0.60 11/04/2022   Lab Results  Component Value Date   GFR 108.48 11/04/2022   Lab Results  Component Value Date   MICROALBUR 4.9 (H) 11/04/2022      Component Value Date/Time   NA 139 11/04/2022 1006   K 3.8 11/04/2022 1006   CL 103 11/04/2022 1006   CO2 27 11/04/2022 1006   GLUCOSE 215 (H) 11/04/2022 1006   GLUCOSE 83 03/15/2008 0535   BUN 7 11/04/2022 1006   CREATININE 0.60 11/04/2022 1006   CREATININE 0.66 02/15/2020 0939   CALCIUM 9.2 11/04/2022 1006   PROT 7.4 11/04/2022 1006   ALBUMIN 4.3 11/04/2022 1006   AST 20 11/04/2022 1006   ALT 32 11/04/2022 1006   ALKPHOS 60 11/04/2022 1006   BILITOT 1.5 (H) 11/04/2022 1006   GFRNONAA >60 04/07/2008 0450   GFRAA  04/07/2008 0450    >60        The eGFR has been calculated using the MDRD equation. This calculation has not been validated in all clinical      Latest Ref Rng & Units 11/04/2022   10:06 AM 11/25/2021    9:29 AM 08/21/2020    9:45 AM  BMP  Glucose 70 - 99 mg/dL 161  096  045   BUN 6 - 23 mg/dL 7  7  7    Creatinine 0.40  - 1.20 mg/dL 4.09  8.11  9.14   Sodium 135 - 145 mEq/L 139  137  140   Potassium 3.5 - 5.1 mEq/L 3.8  3.9  4.3   Chloride 96 - 112 mEq/L 103  98  105   CO2 19 - 32 mEq/L 27  31  28    Calcium 8.4 - 10.5 mg/dL 9.2  9.4  8.7        Component Value Date/Time   WBC 5.0 11/25/2021 0929   RBC 4.45 11/25/2021 0929   HGB 13.7 11/25/2021 0929   HCT 40.1 11/25/2021 0929   PLT 263.0 11/25/2021 0929   MCV 90.1 11/25/2021 0929   MCH 30.1 08/11/2018 1036   MCHC 34.1 11/25/2021 0929   RDW 12.9 11/25/2021 0929   LYMPHSABS 2.3 11/25/2021 0929   MONOABS 0.4 11/25/2021 0929   EOSABS 0.1 11/25/2021 0929   BASOSABS 0.0 11/25/2021 0929     Parts of this note may have been dictated using voice recognition software. There may be variances in spelling and vocabulary which are unintentional. Not all errors are proofread. Please notify the [facility] Parkin if  any discrepancies are noted or if the meaning of any statement is not clear.

## 2023-05-17 NOTE — Patient Instructions (Signed)

## 2023-06-14 ENCOUNTER — Encounter: Payer: Self-pay | Admitting: Internal Medicine

## 2023-06-15 ENCOUNTER — Other Ambulatory Visit: Payer: Self-pay | Admitting: Obstetrics and Gynecology

## 2023-06-15 NOTE — Patient Outreach (Signed)
  Medicaid Managed Care   Unsuccessful Attempt Note   06/15/2023 Name: Angelica Berg MRN: [id number] DOB: 1975/09/26  Referred by: Philip Aspen, Limmie Patricia, MD Reason for referral : High Risk Managed Medicaid (Unsuccessful telephone outreach)  An unsuccessful telephone outreach was attempted today. The patient was referred to the case management team for assistance with care management and care coordination.    Follow Up Plan: The Managed Medicaid care management team will reach out to the patient again over the next 30 business  days. and The  Patient has been provided with contact information for the Managed Medicaid care management team and has been advised to call with any health related questions or concerns.   [name] RN, BSN, [employer] [facility] RN Care Manager Direct Dial [phone]/WGN [phone] Website: [email or web address]

## 2023-06-15 NOTE — Patient Instructions (Signed)
 Visit Information  Ms. [name]  - as a part of your Medicaid benefit, you are eligible for care management and care coordination services at no cost or copay. I was unable to reach you by phone today but would be happy to help you with your health related needs. Please feel free to call me at [phone].   A member of the Managed Medicaid care management team will reach out to you again over the next 30 business days.   [name] RN, BSN, [employer] [facility] RN Care Manager Direct Dial [phone]/GNF [phone] Website: [email or web address]

## 2023-06-25 ENCOUNTER — Other Ambulatory Visit: Payer: Self-pay | Admitting: Obstetrics and Gynecology

## 2023-06-25 NOTE — Patient Instructions (Signed)
 Visit Information  Ms. Klare was given information about Medicaid Managed Care team care coordination services as a part of their [facility] Community Plan Medicaid benefit. [name] verbally consented to engagement with the [facility] Managed Care team.   If you are experiencing a medical emergency, please call 911 or report to your local emergency department or urgent care.   If you have a non-emergency medical problem during routine business hours, please contact your provider's office and ask to speak with a nurse.   For questions related to your [facility], please call: [phone] or visit the homepage here: [email or web address]  If you would like to schedule transportation through your [facility], please call the following number at least 2 days in advance of your appointment: [phone]   Rides for urgent appointments can also be made after hours by calling Member Services.  Call the Behavioral Health Crisis Line at [phone], at any time, 24 hours a day, 7 days a week. If you are in danger or need immediate medical attention call 911.  If you would like help to quit smoking, call 1-800-QUIT-NOW (612-154-1668) OR Espaol: 1-855-Djelo-Ya (1-324-401-0272) o para ms informacin haga clic aqu or Text READY to 536-644 to register via text  [name] - following are the goals we discussed in your visit today:   Goals Addressed    Timeframe:  Long-Range Goal Priority:  High Start Date:     08/18/21                        Expected End Date: ongoing                      Follow Up Date: 07/08/23  - practice safe sex - schedule appointment for flu shot - schedule appointment for vaccines needed due to my age or health - schedule recommended health tests (blood work, mammogram, colonoscopy, pap test) - schedule and keep appointment for annual check-up    Why is  this important?   Screening tests can find diseases early when they are easier to treat.  Your doctor or nurse will talk with you about which tests are important for you.  Getting shots for common diseases like the flu and shingles will help prevent them.   06/25/23:  ENDO appt 4/28  Patient verbalizes understanding of instructions and care plan provided today and agrees to view in MyChart. Active MyChart status and patient understanding of how to access instructions and care plan via MyChart confirmed with patient.     The Managed Medicaid care management team will reach out to the patient again over the next 30 business  days.  The  Patient has been provided with contact information for the Managed Medicaid care management team and has been advised to call with any health related questions or concerns.   [name] RNC-MNN, BSN, [employer] [facility] RN Care Manager Direct Dial [phone]/LOV [phone] Website: [email or web address]   Following is a copy of your plan of care:  Care Plan : RN Care Manager Plan Of Care  Updates made by [name], RN since 06/25/2023 12:00 AM     Problem: Knowledge Deficit and Care Coordination Needs Related to Management of  DM, obesity and HLD   Priority: High     Long-Range Goal: Development of Plan Of Care to Address Care Coordination Needs and Knowledge Deficits for Management of DM, obesity,  HLD   Start Date: 04/22/2021  Expected End Date: 09/25/2023  Priority: High  Note:   Current Barriers:  Knowledge Deficits related to plan of care for management of HLD, DMII, HTN and Obesity  06/25/23:  Recent ENDO appt-blood sugars 137-154-has f/u scheduled.. C/O of depressive symptoms-thinks may be related to Ozempic-not sure.  Offered LCSW and declined at this time.  Not taking Metformin-states she will restart.    RNCM Clinical Goal(s):  Patient will verbalize basic understanding of HLD, DMII, and obesity disease process and self  health management plan as evidenced by improved Hgb A1C, weight loss or no weight gain, normal lipid panel take all medications exactly as prescribed and will call provider for medication related questions as evidenced by patient reporting improvement in taken medications as prescribed    demonstrate understanding of rationale for each prescribed medication as evidenced by patient able to report to provider purpose of medication    attend all scheduled medical appointments: with primary care provider     demonstrate improved adherence to prescribed treatment plan for HLD, DMII, and Obesity as evidenced by improved Hgb A1C, weight loss of no weight gain, improvement in lipid panel continue to work with Medical illustrator and/or Social Worker to address care management and care coordination needs related to HLD, DMII, and Obesity as evidenced by adherence to CM Team Scheduled appointments  through collaboration with RN Care manager, provider, and care team.  Work with pharmacist to address inconsistent medication taking behavior and personal feeling regarding medications. i.e. "I don't like taking so many medications" and appropriateness of adding Tirzepatide to medication regimen in place of Trulicity  Interventions: Inter-disciplinary care team collaboration (see longitudinal plan of care) Evaluation of current treatment plan related to  self management and patient's adherence to plan as established by provider Care guide referral for dental resources-completed. Collaborated with Care Guide for resources. Encouraged patient to take all medications and keep ENDO appt.  To follow up with provider for needed referrals-completed 04/05/23:  patient to f/u with PCP regarding right shoulder pain.    Hypertension Interventions:  (Status:  New goal.) Long Term Goal Last practice recorded BP readings:  BP Readings from Last 3 Encounters:        02/22/23      120/88  05/17/23              124/70 11/11/22               120/75  Most recent eGFR/CrCl: No results found for: "EGFR"  No components found for: "CRCL"  Evaluation of current treatment plan related to hypertension self management and patient's adherence to plan as established by provider Reviewed medications with patient and discussed importance of compliance Discussed plans with patient for ongoing care management follow up and provided patient with direct contact information for care management team Advised patient, providing education and rationale, to monitor blood pressure daily and record, calling PCP for findings outside established parameters Reviewed scheduled/upcoming provider appointments  Assessed social determinant of health barriers   Interdisciplinary Collaboration Interventions:  (Status: Goal on track:  NO.) Long Term Goal  - Collaborated with BSW to initiate plan of care to address needs related to Financial constraints related to limited income, Limited access to food, and Lacks knowledge of community resource: to address food and financial insecurity and dental resources  in patient with HLD, DMII, and Obesity Collaboration with Philip Aspen, Limmie Patricia, MD regarding development and update of comprehensive plan of care  as evidenced by provider attestation and co-signature Inter-disciplinary care team collaboration   Referral placed with the pharmacist to address medication taking behavior and patient's personal feelings of "I don't like taking so many medications"  Schedule initial telephone appointment with Managed Medicaid BSW [name] to address financial insecurities and patient's inability to pay rent Referral sent to community care guide requesting resources provided in January related to financial insecurity and utilities be resent to patient   Diabetes:  (Status: Goal on track: NO.) Long Term Goal - patient states she is taking Oxempic and Metformin  as prescribed  Lab Results  Component Value Date         HGBA1C                                                       6.8                                   02/22/23      HGBA1C                                                      7.0                                    05/17/23      HGBA1C                                                       9.7                                   11/11/22  Provided education to patient about basic DM disease process; Reviewed medications with patient and discussed importance of medication adherence;        Reviewed prescribed diet with patient CHO controlled; Discussed plans with patient for ongoing care management follow up and provided patient with direct contact information for care management team;   Hyperlipidemia:  (Status: Condition stable. Not addressed this visit.) Long Term Goal  Lab Results  Component Value Date   CHOL 113 08/21/2020   HDL 44.70 08/21/2020   LDLCALC 52 08/21/2020   TRIG 80.0 08/21/2020   CHOLHDL 3 08/21/2020    Medication review performed; medication list updated in electronic medical record.   Weight Loss:  (Status: Goal on track: NO.) Long term Goal-  [facility] Reviewed DM metabolic deficits including decreased satiety signaling to brain from intestines and newer medication Tirzepatide used to treat DM/ lipid/BP . Encouraged patient to discuss referral to [facility] to assist her with weight management and glycemic control when she sees her primary care provider  Patient Goals/Self-Care Activities: Take medications as prescribed   Attend all  scheduled provider appointments Call pharmacy for medication refills 3-7 days in advance of running out of medications Perform all self care activities independently  Perform IADL's (shopping, preparing meals, housekeeping, managing finances) independently Call provider office for new concerns or questions  set goal weight fill half of plate with  vegetables manage portion size switch to sugar-free drinks - call for medicine refill 2 or 3 days before it runs out - take all medications exactly as prescribed - call doctor with any symptoms you believe are related to your medicine - call doctor when you experience any new symptoms - go to all doctor appointments as scheduled - adhere to prescribed diet: CHO controlled - discuss referral to [facility] with primary care provider Patient to follow up on test strip-completed Patient to contact pharmacy/PCP/GYN regarding medications-completed Patient to call PCP office to f/u on ENDO referral placed 08/25/22-completed

## 2023-06-25 NOTE — Patient Outreach (Signed)
 Medicaid Managed Care   Nurse Care Manager Note  06/25/2023 Name:  Angelica Berg MRN:  [id number] DOB:  Sep 07, 1975  Angelica Berg is an 48 y.o. year old female who is a primary patient of Philip Aspen, Limmie Patricia, MD.  The [facility] Managed Care Coordination team was consulted for assistance with:    Chronic healthcare management needs, HLD, DM, SDOH  [name] was given information about Medicaid Managed Care Coordination team services today. Angelica Berg Patient agreed to services and verbal consent obtained.  Engaged with patient by telephone for follow up visit in response to provider referral for case management and/or care coordination services.   Patient is participating in a Managed Medicaid Plan:  Yes  Assessments/Interventions:  Review of past medical history, allergies, medications, health status, including review of consultants reports, laboratory and other test data, was performed as part of comprehensive evaluation and provision of chronic care management services.  SDOH (Social Drivers of Health) assessments and interventions performed: SDOH Interventions    Flowsheet Row Patient Outreach Telephone from 06/25/2023 in [place] POPULATION HEALTH DEPARTMENT Patient Outreach Telephone from 05/12/2023 in [place] POPULATION HEALTH DEPARTMENT Patient Outreach Telephone from 04/05/2023 in [place] POPULATION HEALTH DEPARTMENT Patient Outreach Telephone from 02/15/2023 in [place] POPULATION HEALTH DEPARTMENT Patient Outreach Telephone from 11/19/2022 in [place] POPULATION HEALTH DEPARTMENT Patient Outreach Telephone from 10/19/2022 in [place] POPULATION HEALTH DEPARTMENT  SDOH Interventions        Food Insecurity Interventions -- -- -- -- -- --  [referrals have been placed]  Housing Interventions -- -- -- Intervention Not Indicated -- --  Transportation Interventions Intervention Not Indicated -- -- -- -- --  Utilities Interventions -- -- Intervention Not  Indicated -- -- --  Alcohol Usage Interventions -- -- Intervention Not Indicated (Score <7) -- -- --  Financial Strain Interventions -- -- -- -- -- Other (Comment)  [patient has been given resources]  Physical Activity Interventions -- Intervention Not Indicated, Other (Comments)  [declined] -- -- -- --  Stress Interventions -- Intervention Not Indicated -- -- -- --  Social Connections Interventions -- -- -- Intervention Not Indicated -- --  Health Literacy Interventions -- -- -- -- Intervention Not Indicated --     Care Plan No Known Allergies  Medications Reviewed Today     Reviewed by [name], RN (Registered Nurse) on 06/25/23 at 1445  Med List Status: <None>   Medication Order Taking? Sig Documenting Provider Last Dose Status Informant  amLODipine (NORVASC) 5 MG tablet [id number]  Take 1 tablet (5 mg total) by mouth daily.  Patient not taking: Reported on 05/17/2023   Philip Aspen, Limmie Patricia, MD  Active   atorvastatin (LIPITOR) 80 MG tablet [id number]  Take 1 tablet (80 mg total) by mouth daily. Philip Aspen, Limmie Patricia, MD  Active   Blood Glucose Monitoring Suppl (ACCU-CHEK GUIDE) w/Device KIT [id number]  1 Device by Does not apply route daily. E11.9 [name], Konrad Dolores, MD  Active            Med Note Clearance Coots, Mel Almond Aug 04, 2021 11:03 AM)    Blood Glucose Monitoring Suppl [facility] VERIO) w/Device KIT [id number]  1 Device by Does not apply route as directed. [name], Konrad Dolores, MD  Active   estradiol (ESTRACE) 0.1 MG/GM vaginal cream [id number]  Insert one gram into vagina twice weekly. [name], MD  Active   glucose blood (ACCU-CHEK GUIDE) test strip [id number]  Use  as instructed to test blood sugar 2 times daily E11.9 Philip Aspen, Limmie Patricia, MD  Active   ibuprofen (ADVIL) 800 MG tablet [id number]  TAKE 1 TABLET BY MOUTH EVERY 8 HOURS AS NEEDED Philip Aspen, Limmie Patricia, MD  Active   lisinopril (ZESTRIL) 5 MG tablet [id number]  TAKE 1  TABLET BY MOUTH DAILY Philip Aspen, Limmie Patricia, MD  Active   meloxicam (MOBIC) 7.5 MG tablet [id number]  TAKE 1 TABLET BY MOUTH EVERY DAY Philip Aspen, Limmie Patricia, MD  Active   metFORMIN (GLUCOPHAGE) 1000 MG tablet [id number] No Take 1 tablet (1,000 mg total) by mouth 2 (two) times daily with a meal.  Patient not taking: Reported on 06/25/2023   Altamese [place], MD Not Taking Active   Semaglutide, 2 MG/DOSE, (OZEMPIC, 2 MG/DOSE,) 8 MG/3ML SOPN [id number]  Inject 2 mg into the skin once a week.  Patient taking differently: Inject 1 mg into the skin once a week.   Altamese [place], MD  Active Self           Patient Active Problem List   Diagnosis Date Noted   Non-adherence to medical treatment 08/25/2022   Hypertension associated with diabetes (HCC) 02/25/2022   Pain in left foot 12/31/2021   Pain in left knee 05/23/2019   Type 2 diabetes mellitus with diabetic nephropathy, without long-term current use of insulin (HCC) 08/30/2018   Morbid obesity (HCC) 08/30/2018   Vitamin D deficiency 08/30/2018   Hyperlipidemia associated with type 2 diabetes mellitus (HCC) 08/30/2018   Transaminitis 08/30/2018   Conditions to be addressed/monitored per PCP order:  Chronic healthcare management needs, HLD, DM, SDOH  Care Plan : RN Care Manager Plan Of Care  Updates made by [name], RN since 06/25/2023 12:00 AM     Problem: Knowledge Deficit and Care Coordination Needs Related to Management of  DM, obesity and HLD   Priority: High     Long-Range Goal: Development of Plan Of Care to Address Care Coordination Needs and Knowledge Deficits for Management of DM, obesity, HLD   Start Date: 04/22/2021  Expected End Date: 09/25/2023  Priority: High  Note:   Current Barriers:  Knowledge Deficits related to plan of care for management of HLD, DMII, HTN and Obesity  06/25/23:  Recent ENDO appt-blood sugars 137-154-has f/u scheduled.. C/O of depressive symptoms-thinks may be related to Ozempic-not sure.   Offered LCSW and declined at this time.  Not taking Metformin-states she will restart.    RNCM Clinical Goal(s):  Patient will verbalize basic understanding of HLD, DMII, and obesity disease process and self health management plan as evidenced by improved Hgb A1C, weight loss or no weight gain, normal lipid panel take all medications exactly as prescribed and will call provider for medication related questions as evidenced by patient reporting improvement in taken medications as prescribed    demonstrate understanding of rationale for each prescribed medication as evidenced by patient able to report to provider purpose of medication    attend all scheduled medical appointments: with primary care provider     demonstrate improved adherence to prescribed treatment plan for HLD, DMII, and Obesity as evidenced by improved Hgb A1C, weight loss of no weight gain, improvement in lipid panel continue to work with Medical illustrator and/or Social Worker to address care management and care coordination needs related to HLD, DMII, and Obesity as evidenced by adherence to CM Team Scheduled appointments  through collaboration with Medical illustrator, provider, and care team.  Work with  pharmacist to address inconsistent medication taking behavior and personal feeling regarding medications. i.e. "I don't like taking so many medications" and appropriateness of adding Tirzepatide to medication regimen in place of Trulicity  Interventions: Inter-disciplinary care team collaboration (see longitudinal plan of care) Evaluation of current treatment plan related to  self management and patient's adherence to plan as established by provider Care guide referral for dental resources-completed. Collaborated with Care Guide for resources. Encouraged patient to take all medications and keep ENDO appt.  To follow up with provider for needed referrals-completed 04/05/23:  patient to f/u with PCP regarding right shoulder pain.     Hypertension Interventions:  (Status:  New goal.) Long Term Goal Last practice recorded BP readings:  BP Readings from Last 3 Encounters:        02/22/23      120/88  05/17/23              124/70 11/11/22              120/75  Most recent eGFR/CrCl: No results found for: "EGFR"  No components found for: "CRCL"  Evaluation of current treatment plan related to hypertension self management and patient's adherence to plan as established by provider Reviewed medications with patient and discussed importance of compliance Discussed plans with patient for ongoing care management follow up and provided patient with direct contact information for care management team Advised patient, providing education and rationale, to monitor blood pressure daily and record, calling PCP for findings outside established parameters Reviewed scheduled/upcoming provider appointments  Assessed social determinant of health barriers   Interdisciplinary Collaboration Interventions:  (Status: Goal on track:  NO.) Long Term Goal  - Collaborated with BSW to initiate plan of care to address needs related to Financial constraints related to limited income, Limited access to food, and Lacks knowledge of community resource: to address food and financial insecurity and dental resources  in patient with HLD, DMII, and Obesity Collaboration with Philip Aspen, Limmie Patricia, MD regarding development and update of comprehensive plan of care as evidenced by provider attestation and co-signature Inter-disciplinary care team collaboration   Referral placed with the pharmacist to address medication taking behavior and patient's personal feelings of "I don't like taking so many medications"  Schedule initial telephone appointment with Managed Medicaid BSW [name] to address financial insecurities and patient's inability to pay rent Referral sent to community care guide requesting resources provided in January related to financial  insecurity and utilities be resent to patient   Diabetes:  (Status: Goal on track: NO.) Long Term Goal - patient states she is taking Oxempic and Metformin  as prescribed  Lab Results  Component Value Date        HGBA1C                                                       6.8                                   02/22/23      HGBA1C  7.0                                    05/17/23      HGBA1C                                                       9.7                                   11/11/22  Provided education to patient about basic DM disease process; Reviewed medications with patient and discussed importance of medication adherence;        Reviewed prescribed diet with patient CHO controlled; Discussed plans with patient for ongoing care management follow up and provided patient with direct contact information for care management team;   Hyperlipidemia:  (Status: Condition stable. Not addressed this visit.) Long Term Goal  Lab Results  Component Value Date   CHOL 113 08/21/2020   HDL 44.70 08/21/2020   LDLCALC 52 08/21/2020   TRIG 80.0 08/21/2020   CHOLHDL 3 08/21/2020    Medication review performed; medication list updated in electronic medical record.   Weight Loss:  (Status: Goal on track: NO.) Long term Goal-  [facility] Reviewed DM metabolic deficits including decreased satiety signaling to brain from intestines and newer medication Tirzepatide used to treat DM/ lipid/BP . Encouraged patient to discuss referral to [place] to assist her with weight management and glycemic control when she sees her primary care provider  Patient Goals/Self-Care Activities: Take medications as prescribed   Attend all scheduled provider appointments Call pharmacy for medication refills 3-7 days in advance of running out of medications Perform all self  care activities independently  Perform IADL's (shopping, preparing meals, housekeeping, managing finances) independently Call provider office for new concerns or questions  set goal weight fill half of plate with vegetables manage portion size switch to sugar-free drinks - call for medicine refill 2 or 3 days before it runs out - take all medications exactly as prescribed - call doctor with any symptoms you believe are related to your medicine - call doctor when you experience any new symptoms - go to all doctor appointments as scheduled - adhere to prescribed diet: CHO controlled - discuss referral to [facility] with primary care provider Patient to follow up on test strip-completed Patient to contact pharmacy/PCP/GYN regarding medications-completed Patient to call PCP office to f/u on ENDO referral placed 08/25/22-completed   Long-Range Goal: Establish Plan of Care for Chronic Disease Management Needs   Priority: High  Note:   Timeframe:  Long-Range Goal Priority:  High Start Date:     08/18/21                        Expected End Date: ongoing                      Follow Up Date: 07/08/23  - practice safe sex - schedule appointment for flu shot - schedule appointment for vaccines needed due to my age or health - schedule  recommended health tests (blood work, mammogram, colonoscopy, pap test) - schedule and keep appointment for annual check-up    Why is this important?   Screening tests can find diseases early when they are easier to treat.  Your doctor or nurse will talk with you about which tests are important for you.  Getting shots for common diseases like the flu and shingles will help prevent them.   06/25/23:  ENDO appt 4/28   Follow Up:  Patient agrees to Care Plan and Follow-up.  Plan: The Managed Medicaid care management team will reach out to the patient again over the next 30 business  days. and The  Patient has been provided with contact  information for the Managed Medicaid care management team and has been advised to call with any health related questions or concerns.  Date/time of next scheduled RN care management/care coordination outreach:  07/08/23 at 4.

## 2023-07-06 ENCOUNTER — Other Ambulatory Visit: Payer: Self-pay | Admitting: "Endocrinology

## 2023-07-06 DIAGNOSIS — E1140 Type 2 diabetes mellitus with diabetic neuropathy, unspecified: Secondary | ICD-10-CM

## 2023-07-08 ENCOUNTER — Other Ambulatory Visit: Payer: Self-pay | Admitting: Obstetrics and Gynecology

## 2023-07-08 NOTE — Patient Outreach (Signed)
 Care Coordination  07/08/2023  NATASJA NIDAY [birth year] [phone]  RNCM called patient to follow up.  Patient states she is taking her Metformin.  States she is feeling better with no depression symptoms right now.    [name] RNC, BSN, [facility] RN Care Manager Direct Dial [phone] Website: [email or web address]

## 2023-07-12 ENCOUNTER — Other Ambulatory Visit: Payer: Self-pay | Admitting: "Endocrinology

## 2023-07-12 DIAGNOSIS — E1140 Type 2 diabetes mellitus with diabetic neuropathy, unspecified: Secondary | ICD-10-CM

## 2023-07-20 ENCOUNTER — Other Ambulatory Visit (HOSPITAL_COMMUNITY): Payer: Self-pay

## 2023-07-26 ENCOUNTER — Other Ambulatory Visit: Payer: Self-pay

## 2023-07-28 ENCOUNTER — Other Ambulatory Visit (HOSPITAL_COMMUNITY): Payer: Self-pay

## 2023-07-28 ENCOUNTER — Telehealth: Payer: Self-pay | Admitting: Pharmacy Technician

## 2023-07-28 NOTE — Telephone Encounter (Signed)
 Pharmacy Patient Advocate Encounter   Received notification from CoverMyMeds that prior authorization for Ozempic (0.25 or 0.5 MG/DOSE) 2MG /3ML pen-injectors is due for renewal.   Insurance verification completed.   The patient is insured through [employer].  Action: PA required; PA submitted to above mentioned insurance via CoverMyMeds Key/confirmation #/EOC [id number] Status is pending

## 2023-07-28 NOTE — Telephone Encounter (Signed)
 Noted.

## 2023-08-03 ENCOUNTER — Other Ambulatory Visit: Payer: Self-pay | Admitting: Internal Medicine

## 2023-08-03 DIAGNOSIS — E785 Hyperlipidemia, unspecified: Secondary | ICD-10-CM

## 2023-08-03 DIAGNOSIS — E119 Type 2 diabetes mellitus without complications: Secondary | ICD-10-CM

## 2023-08-06 NOTE — Telephone Encounter (Signed)
 Pharmacy Patient Advocate Encounter  Received notification from [facility] that Prior Authorization for Ozempic  (0.25 or 0.5 MG/DOSE) 2MG /3ML pen-injectors has been APPROVED from 07-28-2023 to 07-27-2024   PA #/Case ID/Reference #: [id number]

## 2023-08-09 ENCOUNTER — Encounter

## 2023-08-09 ENCOUNTER — Other Ambulatory Visit: Payer: Medicaid Other

## 2023-08-09 DIAGNOSIS — E1165 Type 2 diabetes mellitus with hyperglycemia: Secondary | ICD-10-CM | POA: Diagnosis not present

## 2023-08-09 NOTE — Telephone Encounter (Signed)
 Patient is aware

## 2023-08-10 ENCOUNTER — Encounter

## 2023-08-10 ENCOUNTER — Other Ambulatory Visit: Payer: Self-pay

## 2023-08-10 LAB — COMPREHENSIVE METABOLIC PANEL WITH GFR
AG Ratio: 1.3 (calc) (ref 1.0–2.5)
ALT: 21 U/L (ref 6–29)
AST: 14 U/L (ref 10–35)
Albumin: 4.1 g/dL (ref 3.6–5.1)
Alkaline phosphatase (APISO): 58 U/L (ref 31–125)
BUN: 11 mg/dL (ref 7–25)
CO2: 31 mmol/L (ref 20–32)
Calcium: 9.4 mg/dL (ref 8.6–10.2)
Chloride: 102 mmol/L (ref 98–110)
Creat: 0.65 mg/dL (ref 0.50–0.99)
Globulin: 3.1 g/dL (ref 1.9–3.7)
Glucose, Bld: 188 mg/dL — ABNORMAL HIGH (ref 65–99)
Potassium: 4.2 mmol/L (ref 3.5–5.3)
Sodium: 139 mmol/L (ref 135–146)
Total Bilirubin: 1 mg/dL (ref 0.2–1.2)
Total Protein: 7.2 g/dL (ref 6.1–8.1)
eGFR: 110 mL/min/{1.73_m2} (ref >=60)

## 2023-08-10 LAB — HEMOGLOBIN A1C
Hgb A1c MFr Bld: 7.5 % — ABNORMAL HIGH (ref <5.7)
Mean Plasma Glucose: 169 mg/dL
eAG (mmol/L): 9.3 mmol/L

## 2023-08-10 LAB — LIPID PANEL
Cholesterol: 265 mg/dL — ABNORMAL HIGH (ref <200)
HDL: 54 mg/dL (ref >=50)
LDL Cholesterol (Calc): 170 mg/dL — ABNORMAL HIGH
Non-HDL Cholesterol (Calc): 211 mg/dL — ABNORMAL HIGH (ref <130)
Total CHOL/HDL Ratio: 4.9 (calc) (ref <5.0)
Triglycerides: 248 mg/dL — ABNORMAL HIGH (ref <150)

## 2023-08-10 LAB — MICROALBUMIN / CREATININE URINE RATIO
Creatinine, Urine: 134 mg/dL (ref 20–275)
Microalb Creat Ratio: 170 mg/g{creat} — ABNORMAL HIGH (ref <30)
Microalb, Ur: 22.8 mg/dL

## 2023-08-11 NOTE — Patient Outreach (Signed)
 Complex Care Management   Visit Note  08/10/2023  Name:  Angelica Berg MRN: [id number] DOB: 09-Feb-1976  Situation: Referral received for Complex Care Management related to Diabetes with Complications I obtained verbal consent from Patient.  Visit completed with Patient & RNCM on the phone  Background:   Past Medical History:  Diagnosis Date   Diabetes mellitus (HCC)    Dyslipidemia     Assessment: Patient Reported Symptoms:  Cognitive Cognitive Status: Alert and oriented to person, place, and time      Neurological Neurological Review of Symptoms: No symptoms reported    HEENT HEENT Symptoms Reported: No symptoms reported      Cardiovascular Cardiovascular Symptoms Reported: No symptoms reported    Respiratory Respiratory Symptoms Reported: No symptoms reported    Endocrine Patient reports the following symptoms related to hypoglycemia or hyperglycemia : Nausea or vomiting, Weakness or fatigue, Other Other symptoms related to hypoglycemia or hyperglycemia: Patient had not taken her weekly regular dose of Ozempic  1mg  x 3 weeks due to issues obtaining med, was given new script for Ozempic  2mg  weekly and patient took this dose past Sunday 08/08/23 and experienced nausea, vomiting, lack of appetite, and overall felt like she had the "flu" without the fever. Has follow up with Endocrinologist on 4/28 to discuss/evaluate ongoing treatment for her DMII Is patient diabetic?: Yes Is patient checking blood sugars at home?: Yes Endocrine Conditions: Diabetes Endocrine Management Strategies: Weight management (most current weight = 226 pounds, BMI 33.49) Endocrine Self-Management Outcome: 3 (uncertain)  Gastrointestinal Gastrointestinal Symptoms Reported: Nausea, Vomiting, Change in appetite, Abdominal pain or discomfort Additional Gastrointestinal Details: Patient had not taken her weekly regular dose of Ozempic  1mg  x 3 weeks due to issues obtaining med, was given new script for Ozempic   2mg  weekly and patient took this dose past Sunday 08/08/23 and experienced nausea, vomiting, lack of appetite, and overall felt like she had the "flu" without the fever. Has follow up with Endocrinologist on 4/28 to discuss/evaluate ongoing treatment for her DMII Gastrointestinal Self-Management Outcome: 4 (good) Gastrointestinal Comment: GI symptoms believed to be due to 2mg  dose of Ozempic  on Sunday, 08/08/23 - patient states symptoms have subsided greatly as of today, 08/10/23 Nutrition Risk Screen (CP): No indicators present  Genitourinary Genitourinary Symptoms Reported: No symptoms reported    Integumentary Integumentary Symptoms Reported: No symptoms reported    Musculoskeletal Musculoskelatal Symptoms Reviewed: No symptoms reported        Psychosocial Psychosocial Symptoms Reported: No symptoms reported            11/25/2022    8:42 AM  Depression screen PHQ 2/9  Decreased Interest 0  PHQ - 2 Score 0    There were no vitals filed for this visit.  Medications Reviewed Today     Reviewed by Randye Buttner, RN (Registered Nurse) on 08/10/23 at 1612  Med List Status: <None>   Medication Order Taking? Sig Documenting Provider Last Dose Status Informant  amLODipine  (NORVASC ) 5 MG tablet [id number] No Take 1 tablet (5 mg total) by mouth daily.  Patient not taking: Reported on 11/25/2022   Zilphia Hilt, Charyl Coppersmith, MD Not Taking Active   atorvastatin  (LIPITOR) 80 MG tablet [id number] Yes TAKE 1 TABLET BY MOUTH DAILY Zilphia Hilt, Charyl Coppersmith, MD Taking Active   Blood Glucose Monitoring Suppl (ACCU-CHEK GUIDE) w/Device KIT [id number]  1 Device by Does not apply route daily. E11.9 [name], Ibtehal Jaralla, MD  Active  Med Note April Knack, [name]   Mon Aug 04, 2021 11:03 AM)    Blood Glucose Monitoring Suppl [facility] VERIO) w/Device KIT [id number]  1 Device by Does not apply route as directed. [name], Julian Obey, MD  Active   estradiol  (ESTRACE ) 0.1 MG/GM  vaginal cream [id number] Yes Insert one gram into vagina twice weekly. Lavoie, Marie-Lyne, MD Taking Active   glucose blood (ACCU-CHEK GUIDE) test strip [id number]  Use as instructed to test blood sugar 2 times daily E11.9 Zilphia Hilt, Charyl Coppersmith, MD  Active   ibuprofen  (ADVIL ) 800 MG tablet [id number] Yes TAKE 1 TABLET BY MOUTH EVERY 8 HOURS AS NEEDED Zilphia Hilt, Charyl Coppersmith, MD Taking Active   lisinopril  (ZESTRIL ) 5 MG tablet [id number] Yes TAKE 1 TABLET BY MOUTH DAILY Zilphia Hilt, Charyl Coppersmith, MD Taking Active   meloxicam  (MOBIC ) 7.5 MG tablet [id number] Yes TAKE 1 TABLET BY MOUTH EVERY DAY Zilphia Hilt, Charyl Coppersmith, MD Taking Active   metFORMIN  (GLUCOPHAGE ) 1000 MG tablet [id number] Yes Take 1 tablet (1,000 mg total) by mouth 2 (two) times daily with a meal. Jorge Newcomer, MD Taking Active            Med Note (Julita Ozbun A   Tue Aug 10, 2023  4:11 PM) Per pt, was advised not to take on same day as Ozempic  by MD  Semaglutide , 2 MG/DOSE, (OZEMPIC , 2 MG/DOSE,) 8 MG/3ML SOPN [id number] Yes Inject 2 mg into the skin once a week.  Patient taking differently: Inject 1 mg into the skin once a week.   Motwani, Komal, MD Taking Active Self           Med Note Saverio Curling, Darnise Montag A   Tue Aug 10, 2023  4:12 PM) Patient took 2mg  on 4/20 with resultant nausea/vomiting/"felt like the flu" symptoms            Recommendation:   Specialty provider follow-up is on 08/16/23 w/ Endocrinology to review patient's DMII status and discuss ongoing treatment with Ozempic   Follow Up Plan:   Telephone follow up appointment date/time:  08/30/23 at 2pm  [place] A. Saverio Curling RN, BA, [facility], CRRN [place]  [facility] RN Care Manager Direct Dial: [phone]  Fax: [phone]

## 2023-08-11 NOTE — Patient Instructions (Signed)
 Visit Information  Thank you for taking time to visit with me today. Please don't hesitate to contact me if I can be of assistance to you before our next scheduled telephone appointment.  Our next appointment is by telephone on 5/12 at 2pm  Following is a copy of your care plan:   Goals Addressed             This Visit's Progress    VBCI RN Care Plan       Problems:  Chronic Disease Management support and education needs related to DMII  Goal: Over the next 3-6 months the Patient will continue to work with RN Care Manager and/or Social Worker to address care management and care coordination needs related to DMII as evidenced by adherence to care management team scheduled appointments      Interventions:   Diabetes Interventions: Assessed patient's understanding of A1c goal: <7% Provided education to patient about basic DM disease process Reviewed medications with patient and discussed importance of medication adherence Counseled on importance of regular laboratory monitoring as prescribed Discussed plans with patient for ongoing care management follow up and provided patient with direct contact information for care management team Reviewed scheduled/upcoming provider appointments including: Follow up appointment with Endocrinologist on Monday, 4/28 Advised patient, providing education and rationale, to check cbg in the morning at before bedtime and record, calling patient's Endocrinologist for findings outside established parameters Review of patient status, including review of consultants reports, relevant laboratory and other test results, and medications completed Screening for signs and symptoms of depression related to chronic disease state  Assessed social determinant of health barriers Lab Results  Component Value Date   HGBA1C 7.5 (H) 08/09/2023    Patient Self-Care Activities:  Attend all scheduled provider appointments Call pharmacy for medication refills 3-7 days  in advance of running out of medications Call provider office for new concerns or questions  Perform all self care activities independently  Perform IADL's (shopping, preparing meals, housekeeping, managing finances) independently Take medications as prescribed   check feet daily for cuts, sores or redness enter blood sugar readings and medication or insulin into daily log take the blood sugar log to all doctor visits set goal weight drink 6 to 8 glasses of water each day eat fish at least once per week fill half of plate with vegetables limit fast food meals to no more than 1 per week manage portion size set a realistic goal switch to low-fat or skim milk switch to sugar-free drinks do heel pump exercise 2 to 3 times each day keep feet up while sitting wear comfortable, cotton socks wear comfortable, well-fitting shoes  Plan:  The patient has been provided with contact information for the care management team and has been advised to call with any health related questions or concerns.              Patient verbalizes understanding of instructions and care plan provided today and agrees to view in MyChart. Active MyChart status and patient understanding of how to access instructions and care plan via MyChart confirmed with patient.     The patient has been provided with contact information for the care management team and has been advised to call with any health related questions or concerns.   Please call the care guide team at [phone] if you need to cancel or reschedule your appointment.   Please call 1-800-273-TALK (toll free, 24 hour hotline) if you are experiencing a Mental Health or Behavioral Health Crisis  or need someone to talk to.  Valorie Mcgrory A. Saverio Curling RN, BA, [facility], CRRN [place]  [facility] Population Health RN Care Manager Direct Dial: [phone]  Fax: [phone]

## 2023-08-16 ENCOUNTER — Ambulatory Visit (INDEPENDENT_AMBULATORY_CARE_PROVIDER_SITE_OTHER): Payer: Medicaid Other | Admitting: "Endocrinology

## 2023-08-16 ENCOUNTER — Encounter: Payer: Self-pay | Admitting: "Endocrinology

## 2023-08-16 VITALS — BP 124/80 | Ht 69.0 in | Wt 225.0 lb

## 2023-08-16 DIAGNOSIS — Z7984 Long term (current) use of oral hypoglycemic drugs: Secondary | ICD-10-CM | POA: Diagnosis not present

## 2023-08-16 DIAGNOSIS — E782 Mixed hyperlipidemia: Secondary | ICD-10-CM

## 2023-08-16 DIAGNOSIS — E1165 Type 2 diabetes mellitus with hyperglycemia: Secondary | ICD-10-CM | POA: Diagnosis not present

## 2023-08-16 DIAGNOSIS — Z7985 Long-term (current) use of injectable non-insulin antidiabetic drugs: Secondary | ICD-10-CM | POA: Diagnosis not present

## 2023-08-16 NOTE — Progress Notes (Signed)
 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, neuropathy  Lab Results  Component Value Date   GFR 108.48 11/04/2022   Hba1c goal less than 7, current Hba1c is  Lab Results  Component Value Date   HGBA1C 7.5 (H) 08/09/2023   Will recommend the following: Ozempic  2 mg weekly Metformin  1000 mg at bedtime and gradually increase to 1000 mg bid  Couldn't tolerate Metformin  XR 500 mg 2 pills twice a day due to "diarrhea and abdominal pain", patient tolerates regular metformin  1000 mg better   08/16/23: Not interested in jardiance right now, discussed needs and benefits, patient wants to repeat MA/Cr in 6 mo and then decide    No known contraindications to any of above medications  -Last LD and Tg are as follows: Lab Results  Component Value Date   LDLCALC 170 (H) 08/09/2023    Lab Results  Component Value Date   TRIG 248 (H) 08/09/2023   -On atorvastatin  80 mg QD -Follow low fat diet and exercise   -Blood pressure goal <140/90 - Microalbumin/creatinine goal is < 30 -Last MA/Cr is as follows: Lab Results  Component Value Date   MICROALBUR 22.8 08/09/2023   -on ACE/ARB lisinopril  5 mg every day  -diet changes including salt restriction -limit eating outside -counseled BP targets per standards of diabetes care -uncontrolled blood pressure can lead to retinopathy, nephropathy and cardiovascular and atherosclerotic heart  disease  Reviewed and counseled on: -A1C target -Blood sugar targets -Complications of uncontrolled diabetes  -Checking blood sugar before meals and bedtime and bring log next visit -All medications with mechanism of action and side effects -Hypoglycemia management: rule of 15's, Glucagon Emergency Kit and medical alert ID -low-carb low-fat plate-method diet -At least 20 minutes of physical activity per day -Annual dilated retinal eye exam and foot exam -compliance and follow up needs -follow up as scheduled or earlier if problem gets worse  Call if blood sugar is less than 70 or consistently above 250    Take a 15 gm snack of carbohydrate at bedtime before you go to sleep if your blood sugar is less than 100.    If you are going to fast after midnight for a test or procedure, ask your physician for instructions on how to reduce/decrease your insulin dose.    Call if blood sugar is less than 70 or consistently above 250  -Treating a low sugar by rule of 15  (15 gms of sugar every 15 min until sugar is more than 70) If you feel your sugar is low, test your sugar to be sure If your sugar is low (less than 70), then take 15 grams of a fast acting Carbohydrate (3-4 glucose tablets or glucose gel or 4 ounces of juice or regular soda) Recheck your sugar 15 min after treating low to make sure it is more than 70 If sugar is still less than 70, treat again with 15 grams of carbohydrate          Don't drive the hour of hypoglycemia  If unconscious/unable to eat or drink by mouth, use glucagon injection or nasal spray baqsimi and call 911. Can repeat again in 15 min if still unconscious.  Return in about 3 months (around 11/15/2023).   I have reviewed current medications, nurse's notes, allergies, vital signs, past medical and surgical history, family medical history, and social history for this encounter. Counseled patient on symptoms, examination findings, lab findings, imaging results,  treatment decisions and monitoring and prognosis. The patient understood the recommendations and agrees with the treatment plan. All questions regarding treatment plan were fully answered.  Jorge Newcomer, MD  08/16/23   History of Present Illness Angelica Berg is a 48 y.o. year old female who presents for follow up of Type II diabetes mellitus.  Angelica Berg was first diagnosed in 07/2018.   Diabetes education +  Home diabetes regimen: Metformin  1000 mg at bedtime and gradually increase to 1000 mg bid  Ozempic  2 mg weekly  Prior Medications tried/Intolerance: Started on Metformin  and Glipizide   Couldn't tolerate Metformin  XR 500 mg 2 pills twice a day due to "diarrhea and abdominal pain", patient tolerates regular metformin  1000 mg better   COMPLICATIONS -  MI/Stroke -  retinopathy +  neuropathy -  nephropathy  BLOOD SUGAR DATA Did not bring meter Checks fasting/after supper Range 143-147  Physical Exam  BP 124/80   Ht 5\' 9"  (1.753 m)   Wt 225 lb (102.1 kg)   LMP  (LMP Unknown)   BMI 33.23 kg/m    Constitutional: well developed, well nourished Head: normocephalic, atraumatic Eyes: sclera anicteric, no redness Neck: supple Lungs: normal respiratory effort Neurology: alert and oriented Skin: dry, no appreciable rashes Musculoskeletal: no appreciable defects Psychiatric: normal mood and affect Diabetic Foot Exam - Simple   No data filed      Current Medications Patient's Medications  New Prescriptions   No medications on file  Previous Medications   AMLODIPINE  (NORVASC ) 5 MG TABLET    Take 1 tablet (5 mg total) by mouth daily.   ATORVASTATIN  (LIPITOR) 80 MG TABLET    TAKE 1 TABLET BY MOUTH DAILY   BLOOD GLUCOSE MONITORING SUPPL (ACCU-CHEK GUIDE) W/DEVICE KIT    1 Device by Does not apply route daily. E11.9   BLOOD GLUCOSE MONITORING SUPPL (ONETOUCH VERIO) W/DEVICE KIT    1 Device by Does not apply route as directed.   ESTRADIOL  (ESTRACE ) 0.1 MG/GM  VAGINAL CREAM    Insert one gram into vagina twice weekly.   GLUCOSE BLOOD (ACCU-CHEK GUIDE) TEST STRIP    Use as instructed to test blood sugar 2 times daily E11.9   IBUPROFEN  (ADVIL ) 800 MG TABLET    TAKE 1 TABLET BY MOUTH EVERY 8 HOURS AS NEEDED   LISINOPRIL  (ZESTRIL ) 5 MG TABLET    TAKE 1 TABLET BY MOUTH DAILY   MELOXICAM  (MOBIC ) 7.5 MG TABLET    TAKE 1 TABLET BY MOUTH EVERY DAY   METFORMIN  (GLUCOPHAGE ) 1000 MG TABLET    Take 1 tablet (1,000 mg total) by mouth 2 (two) times daily with a meal.   SEMAGLUTIDE , 2 MG/DOSE, (OZEMPIC , 2 MG/DOSE,) 8 MG/3ML SOPN    Inject 2 mg into the skin once a week.  Modified Medications   No medications on file  Discontinued Medications   No medications on file    Allergies No Known Allergies  Past Medical History Past Medical History:  Diagnosis Date   Diabetes mellitus (HCC)    Dyslipidemia     Past Surgical History  Past Surgical History:  Procedure Laterality Date   CESAREAN SECTION     KNEE ARTHROCENTESIS      Family History family history includes Cancer in her maternal grandmother; Diabetes in her mother; Heart disease in her maternal grandmother; Hypertension in her mother.  Social History Social History   Socioeconomic History   Marital status: Single    Spouse name: Not on file   Number of children: Not on file   Years of education: Not on file   Highest education level: Not on file  Occupational History   Not on file  Tobacco Use   Smoking status: Former    Current packs/day: 0.00    Types: Cigarettes    Quit date: 06/12/2018    Years since quitting: 5.1   Smokeless tobacco: Never  Vaping Use   Vaping status: Never Used  Substance and Sexual Activity   Alcohol use: Yes    Comment: occ   Drug use: Yes    Frequency: 7.0 times per week    Types: Marijuana   Sexual activity: Not Currently    Partners: Male    Birth control/protection: Post-menopausal  Other Topics Concern   Not on file  Social History Narrative    04/22/21- Single mom of 29 year old twin girls   Social Drivers of Corporate investment banker Strain: Low Risk  (07/08/2023)   Overall Financial Resource Strain (CARDIA)    Difficulty of Paying Living Expenses: Not very hard  Food Insecurity: No Food Insecurity (08/10/2023)   Hunger Vital Sign    Worried About Running Out of Food in the Last Year: Never true    Ran Out of Food in the Last Year: Never true  Transportation Needs: No Transportation Needs (08/10/2023)   PRAPARE - Administrator, Civil Service (Medical): No    Lack of Transportation (Non-Medical): No  Physical Activity: Inactive (05/12/2023)   Exercise Vital Sign    Days of Exercise per Week: 0 days    Minutes of Exercise per Session: 0 min  Stress: No Stress Concern Present (05/12/2023)   [employer] of Occupational Health - Occupational Stress Questionnaire    Feeling of Stress : Only a little  Social Connections: Unknown (02/15/2023)   Social Connection and Isolation Panel [NHANES]    Frequency of Communication with Friends and Family: More than three times a week    Frequency of Social Gatherings with Friends and Family: More than three times a week    Attends Religious Services: Not on Marketing executive or Organizations: No    Attends Banker Meetings: Never    Marital Status: Never married  Recent Concern: Social Connections - Socially Isolated (02/15/2023)   Social Connection and Isolation Panel [NHANES]    Frequency of Communication with Friends and Family: More than three times a week    Frequency of Social Gatherings with Friends and Family: More than three times a week    Attends Religious Services: Never    Database administrator or Organizations: No    Attends Banker Meetings: Never    Marital Status: Never married  Intimate Partner Violence: Not At Risk (08/10/2023)   Humiliation, Afraid, Rape, and Kick questionnaire    Fear of Current or Ex-Partner:  No    Emotionally Abused: No    Physically Abused: No    Sexually Abused: No    Lab Results  Component Value Date   HGBA1C  7.5 (H) 08/09/2023   HGBA1C 7.0 (A) 05/17/2023   HGBA1C 6.8 (A) 02/16/2023   Lab Results  Component Value Date   CHOL 265 (H) 08/09/2023   Lab Results  Component Value Date   HDL 54 08/09/2023   Lab Results  Component Value Date   LDLCALC 170 (H) 08/09/2023   Lab Results  Component Value Date   TRIG 248 (H) 08/09/2023   Lab Results  Component Value Date   CHOLHDL 4.9 08/09/2023   Lab Results  Component Value Date   CREATININE 0.65 08/09/2023   Lab Results  Component Value Date   GFR 108.48 11/04/2022   Lab Results  Component Value Date   MICROALBUR 22.8 08/09/2023      Component Value Date/Time   NA 139 08/09/2023 0833   K 4.2 08/09/2023 0833   CL 102 08/09/2023 0833   CO2 31 08/09/2023 0833   GLUCOSE 188 (H) 08/09/2023 0833   GLUCOSE 83 03/15/2008 0535   BUN 11 08/09/2023 0833   CREATININE 0.65 08/09/2023 0833   CALCIUM  9.4 08/09/2023 0833   PROT 7.2 08/09/2023 0833   ALBUMIN 4.3 11/04/2022 1006   AST 14 08/09/2023 0833   ALT 21 08/09/2023 0833   ALKPHOS 60 11/04/2022 1006   BILITOT 1.0 08/09/2023 0833   GFRNONAA >60 04/07/2008 0450   GFRAA  04/07/2008 0450    >60        The eGFR has been calculated using the MDRD equation. This calculation has not been validated in all clinical      Latest Ref Rng & Units 08/09/2023    8:33 AM 11/04/2022   10:06 AM 11/25/2021    9:29 AM  BMP  Glucose 65 - 99 mg/dL 161  096  045   BUN 7 - 25 mg/dL 11  7  7    Creatinine 0.50 - 0.99 mg/dL 4.09  8.11  9.14   BUN/Creat Ratio 6 - 22 (calc) SEE NOTE:     Sodium 135 - 146 mmol/L 139  139  137   Potassium 3.5 - 5.3 mmol/L 4.2  3.8  3.9   Chloride 98 - 110 mmol/L 102  103  98   CO2 20 - 32 mmol/L 31  27  31    Calcium  8.6 - 10.2 mg/dL 9.4  9.2  9.4        Component Value Date/Time   WBC 5.0 11/25/2021 0929   RBC 4.45 11/25/2021 0929    HGB 13.7 11/25/2021 0929   HCT 40.1 11/25/2021 0929   PLT 263.0 11/25/2021 0929   MCV 90.1 11/25/2021 0929   MCH 30.1 08/11/2018 1036   MCHC 34.1 11/25/2021 0929   RDW 12.9 11/25/2021 0929   LYMPHSABS 2.3 11/25/2021 0929   MONOABS 0.4 11/25/2021 0929   EOSABS 0.1 11/25/2021 0929   BASOSABS 0.0 11/25/2021 0929     Parts of this note may have been dictated using voice recognition software. There may be variances in spelling and vocabulary which are unintentional. Not all errors are proofread. Please notify the [facility] if any discrepancies are noted or if the meaning of any statement is not clear.

## 2023-08-16 NOTE — Patient Instructions (Signed)

## 2023-08-25 ENCOUNTER — Other Ambulatory Visit: Payer: Self-pay | Admitting: Internal Medicine

## 2023-08-25 DIAGNOSIS — M25511 Pain in right shoulder: Secondary | ICD-10-CM

## 2023-08-25 DIAGNOSIS — M542 Cervicalgia: Secondary | ICD-10-CM

## 2023-08-30 ENCOUNTER — Other Ambulatory Visit: Payer: Self-pay

## 2023-08-30 NOTE — Patient Outreach (Signed)
 Complex Care Management   Visit Note  08/30/2023  Name:  Angelica Berg MRN: [id number] DOB: 06/18/1975  Situation: Referral received for Complex Care Management related to Diabetes with Complications I obtained verbal consent from Patient.  Visit completed with Patient & RNCM  on the phone  Background:   Past Medical History:  Diagnosis Date   Diabetes mellitus (HCC)    Dyslipidemia     Assessment: Patient Reported Symptoms:  Cognitive Cognitive Status: Alert and oriented to person, place, and time, Insightful and able to interpret abstract concepts, Normal speech and language skills   Health Maintenance Behaviors: Annual physical exam  Neurological Neurological Review of Symptoms: No symptoms reported    HEENT HEENT Symptoms Reported: No symptoms reported      Cardiovascular Cardiovascular Symptoms Reported: No symptoms reported Does patient have uncontrolled Hypertension?: No Cardiovascular Conditions: Hypertension, High blood cholesterol  Respiratory    No symptoms  Endocrine Patient reports the following symptoms related to hypoglycemia or hyperglycemia : Weakness or fatigue, Nausea or vomiting Other symptoms related to hypoglycemia or hyperglycemia: Patient states that the side effects she experiences from her weekly 2mg  dose of Ozempic  is not as bad and she is etting used to resting and taking it easy every Monday when she takes her Ozempic  as she states it usually makes her feel "under the weather" for about 24 hrs but she does state she is losing weight as desired just not sure how much Is patient diabetic?: Yes Is patient checking blood sugars at home?: Yes Endocrine Conditions: Diabetes Endocrine Management Strategies: Weight management, Adequate rest, Coping strategies, Diet modification, Medication therapy, Routine screening Endocrine Self-Management Outcome: 4 (good)  Gastrointestinal Gastrointestinal Symptoms Reported: Change in appetite, Nausea Additional  Gastrointestinal Details: Remains on Ozempic  2mg  weekly but states side effects of abdominal discomfort, nausea, fatigue, and feeling "under the weather" only lasts about 24 hrs after taking weekly med; patient takes Ozempic  on her day off work, every Monday Gastrointestinal Self-Management Outcome: 4 (good) Gastrointestinal Comment: Weekly symptoms of abdominal discomfort, nausea and fatigue only last about 24 hrs on day of Ozempic  administration Nutrition Risk Screen (CP): No indicators present  Genitourinary Genitourinary Symptoms Reported: No symptoms reported    Integumentary Integumentary Symptoms Reported: No symptoms reported    Musculoskeletal Musculoskelatal Symptoms Reviewed: No symptoms reported   Falls in the past year?: No Number of falls in past year: 1 or less Was there an injury with Fall?: No Fall Risk Category Calculator: 0 Patient Fall Risk Level: Low Fall Risk Patient at Risk for Falls Due to: No Fall Risks  Psychosocial Psychosocial Symptoms Reported: No symptoms reported     Do you feel physically threatened by others?: No      11/25/2022    8:42 AM  Depression screen PHQ 2/9  Decreased Interest 0  PHQ - 2 Score 0    There were no vitals filed for this visit.  Medications Reviewed Today     Reviewed by Randye Buttner, RN (Registered Nurse) on 08/30/23 at 1424  Med List Status: <None>   Medication Order Taking? Sig Documenting Provider Last Dose Status Informant  amLODipine  (NORVASC ) 5 MG tablet [id number] Yes Take 1 tablet (5 mg total) by mouth daily. Zilphia Hilt, Charyl Coppersmith, MD Taking Active   atorvastatin  (LIPITOR) 80 MG tablet [id number] Yes TAKE 1 TABLET BY MOUTH DAILY Zilphia Hilt, Charyl Coppersmith, MD Taking Active   Blood Glucose Monitoring Suppl (ACCU-CHEK GUIDE) w/Device KIT [id number] Yes 1 Device by Does not  apply route daily. E11.9 [name], Julian Obey, MD Taking Active            Med Note April Knack, Rada Buerger Aug 04, 2021 11:03  AM)    Blood Glucose Monitoring Suppl [facility] VERIO) w/Device KIT [id number] Yes 1 Device by Does not apply route as directed. [name], Julian Obey, MD Taking Active   estradiol  (ESTRACE ) 0.1 MG/GM vaginal cream [id number] Yes Insert one gram into vagina twice weekly. Lavoie, Marie-Lyne, MD Taking Active   glucose blood (ACCU-CHEK GUIDE) test strip [id number] Yes Use as instructed to test blood sugar 2 times daily E11.9 Zilphia Hilt, Charyl Coppersmith, MD Taking Active   ibuprofen  (ADVIL ) 800 MG tablet [id number] Yes TAKE 1 TABLET BY MOUTH EVERY 8 HOURS AS NEEDED Zilphia Hilt, Charyl Coppersmith, MD Taking Active   lisinopril  (ZESTRIL ) 5 MG tablet [id number] Yes TAKE 1 TABLET BY MOUTH DAILY Zilphia Hilt, Charyl Coppersmith, MD Taking Active   meloxicam  (MOBIC ) 7.5 MG tablet [id number] Yes TAKE 1 TABLET BY MOUTH EVERY DAY Zilphia Hilt, Charyl Coppersmith, MD Taking Active   metFORMIN  (GLUCOPHAGE ) 1000 MG tablet [id number] Yes Take 1 tablet (1,000 mg total) by mouth 2 (two) times daily with a meal. Jorge Newcomer, MD Taking Active            Med Note (Josemiguel Gries A   Tue Aug 10, 2023  4:11 PM) Per pt, was advised not to take on same day as Ozempic  by MD  Semaglutide , 2 MG/DOSE, (OZEMPIC , 2 MG/DOSE,) 8 MG/3ML SOPN [id number] Yes Inject 2 mg into the skin once a week.  Patient taking differently: Inject 1 mg into the skin once a week.   Jorge Newcomer, MD Taking Active Self           Med Note Saverio Curling, Tiarrah Saville A   Tue Aug 10, 2023  4:12 PM) Patient took 2mg  on 4/20 with resultant nausea/vomiting/"felt like the flu" symptoms            Recommendation:   Specialty provider follow-up 7/28 @8 :40am with Endocrine Specialist, Dr. Jorge Newcomer  Follow Up Plan:   Face to Face appointment date/time: Monday, September 20, 2023 with Barbra Ley A. Saverio Curling RN, BA, [facility], CRRN [place]  [facility] Population Health RN Care Manager Direct Dial: [phone]  Fax: [phone]

## 2023-08-30 NOTE — Patient Instructions (Signed)
 Visit Information  Thank you for taking time to visit with me today. Please don't hesitate to contact me if I can be of assistance to you before our next scheduled telephone appointment.  Our next appointment is by telephone on Monday, 09/20/2023 at 2pm  Following is a copy of your care plan:   Goals Addressed             This Visit's Progress    VBCI RN Care Plan       Problems:  Chronic Disease Management support and education needs related to DMII  Goal: Over the next 3 months the Patient will continue to work with RN Care Manager and/or Social Worker to address care management and care coordination needs related to DMII as evidenced by adherence to care management team scheduled appointments      Interventions:   Diabetes Interventions: Assessed patient's understanding of A1c goal: <7% Provided education to patient about basic DM disease process Reviewed medications with patient and discussed importance of medication adherence Counseled on importance of regular laboratory monitoring as prescribed Discussed plans with patient for ongoing care management follow up and provided patient with direct contact information for care management team Reviewed scheduled/upcoming provider appointments including: Follow up appointment was completed with Endocrinologist on Monday, 4/28; Patient to continue on Ozempic  2mg  injections every 7 days. Advised patient, providing education and rationale, to check cbg in the morning at before bedtime and record, calling patient's Endocrinologist for findings outside established parameters Review of patient status, including review of consultants reports, relevant laboratory and other test results, and medications completed Screening for signs and symptoms of depression related to chronic disease state  Assessed social determinant of health barriers Lab Results  Component Value Date   HGBA1C 7.5 (H) 08/09/2023    Patient Self-Care Activities:  Attend  all scheduled provider appointments Call pharmacy for medication refills 3-7 days in advance of running out of medications Call provider office for new concerns or questions  Perform all self care activities independently  Perform IADL's (shopping, preparing meals, housekeeping, managing finances) independently Take medications as prescribed   check feet daily for cuts, sores or redness enter blood sugar readings and medication or insulin into daily log take the blood sugar log to all doctor visits set goal weight drink 6 to 8 glasses of water each day eat fish at least once per week fill half of plate with vegetables limit fast food meals to no more than 1 per week manage portion size set a realistic goal switch to low-fat or skim milk switch to sugar-free drinks do heel pump exercise 2 to 3 times each day keep feet up while sitting wear comfortable, cotton socks wear comfortable, well-fitting shoes  Plan:  The patient has been provided with contact information for the care management team and has been advised to call with any health related questions or concerns.              Patient verbalizes understanding of instructions and care plan provided today and agrees to view in MyChart. Active MyChart status and patient understanding of how to access instructions and care plan via MyChart confirmed with patient.     The patient has been provided with contact information for the care management team and has been advised to call with any health related questions or concerns.   Please call the care guide team at [phone] if you need to cancel or reschedule your appointment.   Please call 1-800-273-TALK (toll free, 24  hour hotline) if you are experiencing a Mental Health or Behavioral Health Crisis or need someone to talk to.  Jamin Humphries A. Saverio Curling RN, BA, [facility], CRRN [place]  [facility] Population Health RN Care Manager Direct Dial: [phone]  Fax: [phone]

## 2023-09-20 ENCOUNTER — Other Ambulatory Visit: Payer: Self-pay

## 2023-09-26 ENCOUNTER — Other Ambulatory Visit: Payer: Self-pay | Admitting: Internal Medicine

## 2023-09-26 DIAGNOSIS — M25511 Pain in right shoulder: Secondary | ICD-10-CM

## 2023-09-26 DIAGNOSIS — M542 Cervicalgia: Secondary | ICD-10-CM

## 2023-10-18 ENCOUNTER — Telehealth: Payer: Self-pay

## 2023-10-18 ENCOUNTER — Other Ambulatory Visit: Payer: Self-pay

## 2023-10-18 ENCOUNTER — Other Ambulatory Visit (HOSPITAL_COMMUNITY): Payer: Self-pay

## 2023-10-18 NOTE — Patient Instructions (Signed)
 Visit Information  Thank you for taking time to visit with me today. Please don't hesitate to contact me if I can be of assistance to you before our next scheduled telephone appointment.  Our next appointment is by telephone on 11/15/23 at 2pm  Following is a copy of your care plan:   Goals Addressed             This Visit's Progress    VBCI RN Care Plan       Problems:  Chronic Disease Management support and education needs related to DMII  Goal: Over the next 3 months the Patient will continue to work with RN Care Manager and/or Social Worker to address care management and care coordination needs related to DMII as evidenced by adherence to care management team scheduled appointments      Interventions:   Diabetes Interventions: Assessed patient's understanding of A1c goal: <7% Provided education to patient about basic DM disease process Reviewed medications with patient and discussed importance of medication adherence Counseled on importance of regular laboratory monitoring as prescribed Discussed plans with patient for ongoing care management follow up and provided patient with direct contact information for care management team Reviewed scheduled/upcoming provider appointments including: Follow up appointment was completed with Endocrinologist on Monday, 4/28; Patient to continue on Ozempic  2mg  injections every 7 days. Advised patient, providing education and rationale, to check cbg in the morning at before bedtime and record, calling patient's Endocrinologist for findings outside established parameters Review of patient status, including review of consultants reports, relevant laboratory and other test results, and medications completed Screening for signs and symptoms of depression related to chronic disease state  Assessed social determinant of health barriers 10/18/23 Televisit with [facility] - patient reported she has not been able to obtain her Ozempic  prescription from her  pharmacy because they told her RX was lacking a Prior Auth - after patient engagement RNCM alerted PCP office for need of Prior Auth so patient can pick up prescription Lab Results  Component Value Date   HGBA1C 7.5 (H) 08/09/2023    Patient Self-Care Activities:  Attend all scheduled provider appointments Call pharmacy for medication refills 3-7 days in advance of running out of medications Call provider office for new concerns or questions  Perform all self care activities independently  Perform IADL's (shopping, preparing meals, housekeeping, managing finances) independently Take medications as prescribed   check feet daily for cuts, sores or redness enter blood sugar readings and medication or insulin into daily log take the blood sugar log to all doctor visits set goal weight drink 6 to 8 glasses of water each day eat fish at least once per week fill half of plate with vegetables limit fast food meals to no more than 1 per week manage portion size set a realistic goal switch to low-fat or skim milk switch to sugar-free drinks do heel pump exercise 2 to 3 times each day keep feet up while sitting wear comfortable, cotton socks wear comfortable, well-fitting shoes  Plan:  The patient has been provided with contact information for the care management team and has been advised to call with any health related questions or concerns.              Patient verbalizes understanding of instructions and care plan provided today and agrees to view in MyChart. Active MyChart status and patient understanding of how to access instructions and care plan via MyChart confirmed with patient.     The patient has been provided  with contact information for the care management team and has been advised to call with any health related questions or concerns.   Please call the care guide team at [phone] if you need to cancel or reschedule your appointment.   Please call 1-800-273-TALK  (toll free, 24 hour hotline) if you are experiencing a Mental Health or Behavioral Health Crisis or need someone to talk to.  Sunshine Mackowski A. Gordy RN, BA, [facility], CRRN [place]  [facility] Population Health RN Care Manager Direct Dial: [phone]  Fax: [phone]

## 2023-10-18 NOTE — Telephone Encounter (Signed)
 Copied from CRM [id number]. Topic: Clinical - Medication Prior Auth >> Oct 18, 2023  1:36 PM Mesmerise C wrote: Reason for RMF:[facility] Nurse case Mangaer from [facility] states patient needs Ozempic  was ordered but the pharmacy states a PA is needed her pharmacy is [facility] on [address] she can be reach [phone]

## 2023-10-18 NOTE — Telephone Encounter (Signed)
 Pharmacy Patient Advocate Encounter   Received notification from Physician's Office that prior authorization for Ozempic8 is required/requested.   Insurance verification completed.   The patient is insured through [facility] MEDICAID .   Per test claim: The current 28 day co-pay is, $4.00.  No PA needed at this time. This test claim was processed through [facility]- copay amounts may vary at other pharmacies due to pharmacy/plan contracts, or as the patient moves through the different stages of their insurance plan.

## 2023-10-18 NOTE — Patient Outreach (Signed)
 Complex Care Management   Visit Note  10/18/2023  Name:  FIZA NATION MRN: [id number] DOB: 1976/02/13  Situation: Referral received for Complex Care Management related to Diabetes with Complications I obtained verbal consent from Patient.  Visit completed with Patient  on the phone  Background:   Past Medical History:  Diagnosis Date   Diabetes mellitus (HCC)    Dyslipidemia     Assessment: Patient Reported Symptoms:  Cognitive Cognitive Status: Alert and oriented to person, place, and time, Insightful and able to interpret abstract concepts, Normal speech and language skills Cognitive/Intellectual Conditions Management [RPT]: None reported or documented in medical history or problem list   Health Maintenance Behaviors: Annual physical exam Health Facilitated by: Healthy diet  Neurological Neurological Review of Symptoms: No symptoms reported    HEENT HEENT Symptoms Reported: No symptoms reported      Cardiovascular Cardiovascular Symptoms Reported: No symptoms reported Does patient have uncontrolled Hypertension?: No    Respiratory Respiratory Symptoms Reported: No symptoms reported    Endocrine Other symptoms related to hypoglycemia or hyperglycemia: Has been unable to get her Ozempic  from the pharmacy as the pharmacy states they need a Prior Auth - RNCM spoke to PCP's office after patient engagement to alert them that Pharmacy still awaiting Prior Auth on Ozempic  prescription, PCP rep stated they would follow up on delay of RX Is patient diabetic?: Yes Is patient checking blood sugars at home?: Yes List most recent blood sugar readings, include date and time of day: 140 on 10/18/23 am Endocrine Self-Management Outcome: 4 (good)  Gastrointestinal Gastrointestinal Symptoms Reported: Other Other Gastrointestinal Symptoms: Burping Additional Gastrointestinal Details: Has been unable to obtain Ozempic  prescription from pharmacy as they state they are awaiting a Prior Auth -  RNCM followed up with PCP staff to let them know of patient's inability to get prescription - staff memeber stated they would follow up   Nutrition Risk Screen (CP): No indicators present  Genitourinary Genitourinary Symptoms Reported: No symptoms reported    Integumentary Integumentary Symptoms Reported: No symptoms reported    Musculoskeletal Musculoskelatal Symptoms Reviewed: No symptoms reported   Falls in the past year?: No Patient at Risk for Falls Due to: No Fall Risks  Psychosocial Psychosocial Symptoms Reported: No symptoms reported     Quality of Family Relationships: supportive Do you feel physically threatened by others?: No      11/25/2022    8:42 AM  Depression screen PHQ 2/9  Decreased Interest 0  PHQ - 2 Score 0    There were no vitals filed for this visit.  Medications Reviewed Today     Reviewed by Gordy Channing LABOR, RN (Registered Nurse) on 10/18/23 at 1510  Med List Status: <None>   Medication Order Taking? Sig Documenting Provider Last Dose Status Informant  amLODipine  (NORVASC ) 5 MG tablet [id number] Yes Take 1 tablet (5 mg total) by mouth daily. Theophilus Andrews, Tully GRADE, MD  Active   atorvastatin  (LIPITOR) 80 MG tablet [id number] Yes TAKE 1 TABLET BY MOUTH DAILY Theophilus Andrews, Tully GRADE, MD  Active   Blood Glucose Monitoring Suppl (ACCU-CHEK GUIDE) w/Device KIT [id number] Yes 1 Device by Does not apply route daily. E11.9 [name], Ibtehal Jaralla, MD  Active            Med Note BUZZ, DOROTHYANN ONEIDA Kitchens Aug 04, 2021 11:03 AM)    Blood Glucose Monitoring Suppl [facility] VERIO) w/Device KIT [id number] Yes 1 Device by Does not apply route as directed. [name], Ibtehal Jaralla,  MD  Active   estradiol  (ESTRACE ) 0.1 MG/GM vaginal cream [id number] Yes Insert one gram into vagina twice weekly. Lavoie, Marie-Lyne, MD  Active   glucose blood (ACCU-CHEK GUIDE) test strip [id number] Yes Use as instructed to test blood sugar 2 times daily E11.9 Theophilus Andrews,  Tully GRADE, MD  Active   ibuprofen  (ADVIL ) 800 MG tablet [id number] Yes TAKE 1 TABLET BY MOUTH EVERY 8 HOURS AS NEEDED Theophilus Andrews, Tully GRADE, MD  Active   lisinopril  (ZESTRIL ) 5 MG tablet [id number] Yes TAKE 1 TABLET BY MOUTH DAILY Theophilus Andrews, Tully GRADE, MD  Active   meloxicam  (MOBIC ) 7.5 MG tablet [id number] Yes TAKE 1 TABLET BY MOUTH EVERY DAY Theophilus Andrews, Tully GRADE, MD  Active   metFORMIN  (GLUCOPHAGE ) 1000 MG tablet [id number] Yes Take 1 tablet (1,000 mg total) by mouth 2 (two) times daily with a meal. Dartha Ernst, MD  Active            Med Note (Adaiah Jaskot A   Tue Aug 10, 2023  4:11 PM) Per pt, was advised not to take on same day as Ozempic  by MD  Semaglutide , 2 MG/DOSE, (OZEMPIC , 2 MG/DOSE,) 8 MG/3ML SOPN [id number]  Inject 2 mg into the skin once a week.  Patient not taking: Reported on 10/18/2023   Motwani, Komal, MD  Active Self           Med Note CHANETTA, Nicolena Schurman A   Mon Aug 30, 2023  2:25 PM) Patient now taking 2mg  weekly            Recommendation:   Specialty provider follow-up with Endocrinologist Dr. Dartha on Monday, 11/15/23 @ 8:40am  Follow Up Plan:   Telephone follow up appointment date/time:  Monday, 11/15/23 @ 2pm  Channing A. Gordy RN, BA, [facility], CRRN [place]  [facility] Population Health RN Care Manager Direct Dial: [phone]  Fax: [phone]

## 2023-11-15 ENCOUNTER — Telehealth: Payer: Self-pay

## 2023-11-15 ENCOUNTER — Other Ambulatory Visit (HOSPITAL_COMMUNITY): Payer: Self-pay

## 2023-11-15 ENCOUNTER — Other Ambulatory Visit: Payer: Self-pay

## 2023-11-15 ENCOUNTER — Encounter: Payer: Self-pay | Admitting: "Endocrinology

## 2023-11-15 ENCOUNTER — Ambulatory Visit (INDEPENDENT_AMBULATORY_CARE_PROVIDER_SITE_OTHER): Admitting: "Endocrinology

## 2023-11-15 VITALS — BP 102/80 | Ht 69.0 in | Wt 223.0 lb

## 2023-11-15 DIAGNOSIS — Z7984 Long term (current) use of oral hypoglycemic drugs: Secondary | ICD-10-CM

## 2023-11-15 DIAGNOSIS — E1165 Type 2 diabetes mellitus with hyperglycemia: Secondary | ICD-10-CM

## 2023-11-15 DIAGNOSIS — E782 Mixed hyperlipidemia: Secondary | ICD-10-CM

## 2023-11-15 LAB — POCT GLYCOSYLATED HEMOGLOBIN (HGB A1C): Hemoglobin A1C: 7.9 % — AB (ref 4.0–5.6)

## 2023-11-15 MED ORDER — OZEMPIC (2 MG/DOSE) 8 MG/3ML ~~LOC~~ SOPN
2.0000 mg | PEN_INJECTOR | SUBCUTANEOUS | 1 refills | Status: DC
  Filled 2023-11-15: qty 3, 28d supply, fill #0

## 2023-11-15 MED ORDER — OZEMPIC (2 MG/DOSE) 8 MG/3ML ~~LOC~~ SOPN: 2.0000 mg | PEN_INJECTOR | SUBCUTANEOUS | 1 refills | Status: DC

## 2023-11-15 NOTE — Telephone Encounter (Signed)
 Pt needs PA done for Ozmepic. Thanks!

## 2023-11-15 NOTE — Progress Notes (Signed)
 Outpatient Endocrinology Note Angelica Birmingham, MD  11/15/23   Angelica Berg 01/19/1976 [id number]  Referring Provider: Theophilus Andrews, Jonna* Primary Care Provider: Theophilus Andrews, Tully GRADE, MD Reason for consultation: Subjective   Assessment & Plan  Diagnoses and all orders for this visit:  Uncontrolled type 2 diabetes mellitus with hyperglycemia (HCC) -     POCT glycosylated hemoglobin (Hb A1C) -     Discontinue: Semaglutide , 2 MG/DOSE, (OZEMPIC , 2 MG/DOSE,) 8 MG/3ML SOPN; Inject 2 mg into the skin once a week. -     Semaglutide , 2 MG/DOSE, (OZEMPIC , 2 MG/DOSE,) 8 MG/3ML SOPN; Inject 2 mg into the skin once a week.  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, neuropathy  Lab Results  Component Value Date   GFR 108.48 11/04/2022   Hba1c goal less than 7, current Hba1c is  Lab Results  Component Value Date   HGBA1C 7.9 (A) 11/15/2023   Will recommend the following: Ozempic  2 mg weekly Metformin  1000 mg at bedtime-mac tolerate dose   Couldn't tolerate Metformin  XR 500 mg 2 pills twice a day due to diarrhea and abdominal pain, patient tolerates regular metformin  1000 mg better  08/16/23: Not interested in jardiance right now, discussed needs and benefits, patient wants to repeat MA/Cr in 6 mo and then decide    No known contraindications to any of above medications  -Last LD and Tg are as follows: Lab Results  Component Value Date   LDLCALC 170 (H) 08/09/2023    Lab Results  Component Value Date   TRIG 248 (H) 08/09/2023   -On atorvastatin  80 mg QD -Follow low fat diet and exercise   -Blood pressure goal <140/90 - Microalbumin/creatinine goal is < 30 -Last MA/Cr is as follows: Lab Results  Component Value Date   MICROALBUR 22.8 08/09/2023   -on ACE/ARB lisinopril  5 mg every day  -diet changes including salt restriction -limit eating  outside -counseled BP targets per standards of diabetes care -uncontrolled blood pressure can lead to retinopathy, nephropathy and cardiovascular and atherosclerotic heart disease  Reviewed and counseled on: -A1C target -Blood sugar targets -Complications of uncontrolled diabetes  -Checking blood sugar before meals and bedtime and bring log next visit -All medications with mechanism of action and side effects -Hypoglycemia management: rule of 15's, Glucagon Emergency Kit and medical alert ID -low-carb low-fat plate-method diet -At least 20 minutes of physical activity per day -Annual dilated retinal eye exam and foot exam -compliance and follow up needs -follow up as scheduled or earlier if problem gets worse  Call if blood sugar is less than 70 or consistently above 250    Take a 15 gm snack of carbohydrate at bedtime before you go to sleep if your blood sugar is less than 100.    If you are going to fast after midnight for a test or procedure, ask your physician for instructions on how to reduce/decrease your insulin dose.    Call if blood sugar is less than 70 or consistently above 250  -Treating a low sugar by rule of 15  (15 gms of sugar every 15 min until sugar is more than 70) If you feel your sugar is low, test your sugar to be sure If your sugar is low (less than 70), then take 15 grams of a fast acting Carbohydrate (3-4 glucose tablets or glucose gel or 4 ounces of juice or regular soda) Recheck your  sugar 15 min after treating low to make sure it is more than 70 If sugar is still less than 70, treat again with 15 grams of carbohydrate          Don't drive the hour of hypoglycemia  If unconscious/unable to eat or drink by mouth, use glucagon injection or nasal spray baqsimi and call 911. Can repeat again in 15 min if still unconscious.  Return in about 3 months (around 02/15/2024) for visit and 8 am labs before next visit.   I have reviewed current medications, nurse's  notes, allergies, vital signs, past medical and surgical history, family medical history, and social history for this encounter. Counseled patient on symptoms, examination findings, lab findings, imaging results, treatment decisions and monitoring and prognosis. The patient understood the recommendations and agrees with the treatment plan. All questions regarding treatment plan were fully answered.  Angelica Birmingham, MD  11/15/23   History of Present Illness Angelica Berg is a 48 y.o. year old female who presents for follow up of Type II diabetes mellitus.  Angelica Berg was first diagnosed in 07/2018.   Diabetes education +  Home diabetes regimen: Metformin  1000 mg at bedtime  Ozempic  2 mg weekly-ran out   Prior Medications tried/Intolerance: Started on Metformin  and Glipizide   Couldn't tolerate Metformin  XR 500 mg 2 pills twice a day due to diarrhea and abdominal pain, patient tolerates regular metformin  1000 mg better   COMPLICATIONS -  MI/Stroke -  retinopathy +  neuropathy -  nephropathy  BLOOD SUGAR DATA Did not bring meter Checks fasting/after supper Highest 170 per pt  Physical Exam  BP 102/80   Ht 5' 9 (1.753 m)   Wt 223 lb (101.2 kg)   LMP  (LMP Unknown)   BMI 32.93 kg/m    Constitutional: well developed, well nourished Head: normocephalic, atraumatic Eyes: sclera anicteric, no redness Neck: supple Lungs: normal respiratory effort Neurology: alert and oriented Skin: dry, no appreciable rashes Musculoskeletal: no appreciable defects Psychiatric: normal mood and affect Diabetic Foot Exam - Simple   Simple Foot Form Diabetic Foot exam was performed with the following findings: Yes 11/15/2023  9:16 AM  Visual Inspection No deformities, no ulcerations, no other skin breakdown bilaterally: Yes Sensation Testing Intact to touch and monofilament testing bilaterally: Yes Pulse Check Posterior Tibialis and Dorsalis pulse intact bilaterally: Yes Comments      Current Medications Patient's Medications  New Prescriptions   No medications on file  Previous Medications   AMLODIPINE  (NORVASC ) 5 MG TABLET    Take 1 tablet (5 mg total) by mouth daily.   ATORVASTATIN  (LIPITOR) 80 MG TABLET    TAKE 1 TABLET BY MOUTH DAILY   BLOOD GLUCOSE MONITORING SUPPL (ACCU-CHEK GUIDE) W/DEVICE KIT    1 Device by Does not apply route daily. E11.9   BLOOD GLUCOSE MONITORING SUPPL (ONETOUCH VERIO) W/DEVICE KIT    1 Device by Does not apply route as directed.   ESTRADIOL  (ESTRACE ) 0.1 MG/GM VAGINAL CREAM    Insert one gram into vagina twice weekly.   GLUCOSE BLOOD (ACCU-CHEK GUIDE) TEST STRIP    Use as instructed to test blood sugar 2 times daily E11.9   IBUPROFEN  (ADVIL ) 800 MG TABLET    TAKE 1 TABLET BY MOUTH EVERY 8 HOURS AS NEEDED   LISINOPRIL  (ZESTRIL ) 5 MG TABLET    TAKE 1 TABLET BY MOUTH DAILY   MELOXICAM  (MOBIC ) 7.5 MG TABLET    TAKE 1 TABLET BY MOUTH EVERY DAY  METFORMIN  (GLUCOPHAGE ) 1000 MG TABLET    Take 1 tablet (1,000 mg total) by mouth 2 (two) times daily with a meal.  Modified Medications   Modified Medication Previous Medication   SEMAGLUTIDE , 2 MG/DOSE, (OZEMPIC , 2 MG/DOSE,) 8 MG/3ML SOPN Semaglutide , 2 MG/DOSE, (OZEMPIC , 2 MG/DOSE,) 8 MG/3ML SOPN      Inject 2 mg into the skin once a week.    Inject 2 mg into the skin once a week.  Discontinued Medications   No medications on file    Allergies No Known Allergies  Past Medical History Past Medical History:  Diagnosis Date   Diabetes mellitus (HCC)    Dyslipidemia     Past Surgical History Past Surgical History:  Procedure Laterality Date   CESAREAN SECTION     KNEE ARTHROCENTESIS      Family History family history includes Cancer in her maternal grandmother; Diabetes in her mother; Heart disease in her maternal grandmother; Hypertension in her mother.  Social History Social History   Socioeconomic History   Marital status: Single    Spouse name: Not on file   Number of  children: Not on file   Years of education: Not on file   Highest education level: Not on file  Occupational History   Not on file  Tobacco Use   Smoking status: Former    Current packs/day: 0.00    Types: Cigarettes    Quit date: 06/12/2018    Years since quitting: 5.4   Smokeless tobacco: Never  Vaping Use   Vaping status: Never Used  Substance and Sexual Activity   Alcohol use: Yes    Comment: occ   Drug use: Yes    Frequency: 7.0 times per week    Types: Marijuana   Sexual activity: Not Currently    Partners: Male    Birth control/protection: Post-menopausal  Other Topics Concern   Not on file  Social History Narrative   04/22/21- Single mom of 64 year old twin girls   Social Drivers of Corporate investment banker Strain: Low Risk  (07/08/2023)   Overall Financial Resource Strain (CARDIA)    Difficulty of Paying Living Expenses: Not very hard  Food Insecurity: No Food Insecurity (10/18/2023)   Hunger Vital Sign    Worried About Running Out of Food in the Last Year: Never true    Ran Out of Food in the Last Year: Never true  Transportation Needs: No Transportation Needs (10/18/2023)   PRAPARE - Administrator, Civil Service (Medical): No    Lack of Transportation (Non-Medical): No  Physical Activity: Inactive (05/12/2023)   Exercise Vital Sign    Days of Exercise per Week: 0 days    Minutes of Exercise per Session: 0 min  Stress: No Stress Concern Present (05/12/2023)   [employer] of Occupational Health - Occupational Stress Questionnaire    Feeling of Stress : Only a little  Social Connections: Socially Isolated (10/18/2023)   Social Connection and Isolation Panel    Frequency of Communication with Friends and Family: More than three times a week    Frequency of Social Gatherings with Friends and Family: More than three times a week    Attends Religious Services: Never    Database administrator or Organizations: No    Attends Banker  Meetings: Never    Marital Status: Never married  Intimate Partner Violence: Not At Risk (10/18/2023)   Humiliation, Afraid, Rape, and Kick questionnaire    Fear  of Current or Ex-Partner: No    Emotionally Abused: No    Physically Abused: No    Sexually Abused: No    Lab Results  Component Value Date   HGBA1C 7.9 (A) 11/15/2023   HGBA1C 7.5 (H) 08/09/2023   HGBA1C 7.0 (A) 05/17/2023   Lab Results  Component Value Date   CHOL 265 (H) 08/09/2023   Lab Results  Component Value Date   HDL 54 08/09/2023   Lab Results  Component Value Date   LDLCALC 170 (H) 08/09/2023   Lab Results  Component Value Date   TRIG 248 (H) 08/09/2023   Lab Results  Component Value Date   CHOLHDL 4.9 08/09/2023   Lab Results  Component Value Date   CREATININE 0.65 08/09/2023   Lab Results  Component Value Date   GFR 108.48 11/04/2022   Lab Results  Component Value Date   MICROALBUR 22.8 08/09/2023      Component Value Date/Time   NA 139 08/09/2023 0833   K 4.2 08/09/2023 0833   CL 102 08/09/2023 0833   CO2 31 08/09/2023 0833   GLUCOSE 188 (H) 08/09/2023 0833   GLUCOSE 83 03/15/2008 0535   BUN 11 08/09/2023 0833   CREATININE 0.65 08/09/2023 0833   CALCIUM  9.4 08/09/2023 0833   PROT 7.2 08/09/2023 0833   ALBUMIN 4.3 11/04/2022 1006   AST 14 08/09/2023 0833   ALT 21 08/09/2023 0833   ALKPHOS 60 11/04/2022 1006   BILITOT 1.0 08/09/2023 0833   GFRNONAA >60 04/07/2008 0450   GFRAA  04/07/2008 0450    >60        The eGFR has been calculated using the MDRD equation. This calculation has not been validated in all clinical      Latest Ref Rng & Units 08/09/2023    8:33 AM 11/04/2022   10:06 AM 11/25/2021    9:29 AM  BMP  Glucose 65 - 99 mg/dL 811  784  672   BUN 7 - 25 mg/dL 11  7  7    Creatinine 0.50 - 0.99 mg/dL 9.34  9.39  9.28   BUN/Creat Ratio 6 - 22 (calc) SEE NOTE:     Sodium 135 - 146 mmol/L 139  139  137   Potassium 3.5 - 5.3 mmol/L 4.2  3.8  3.9   Chloride 98 - 110  mmol/L 102  103  98   CO2 20 - 32 mmol/L 31  27  31    Calcium  8.6 - 10.2 mg/dL 9.4  9.2  9.4        Component Value Date/Time   WBC 5.0 11/25/2021 0929   RBC 4.45 11/25/2021 0929   HGB 13.7 11/25/2021 0929   HCT 40.1 11/25/2021 0929   PLT 263.0 11/25/2021 0929   MCV 90.1 11/25/2021 0929   MCH 30.1 08/11/2018 1036   MCHC 34.1 11/25/2021 0929   RDW 12.9 11/25/2021 0929   LYMPHSABS 2.3 11/25/2021 0929   MONOABS 0.4 11/25/2021 0929   EOSABS 0.1 11/25/2021 0929   BASOSABS 0.0 11/25/2021 0929     Parts of this note may have been dictated using voice recognition software. There may be variances in spelling and vocabulary which are unintentional. Not all errors are proofread. Please notify the dino if any discrepancies are noted or if the meaning of any statement is not clear.

## 2023-11-15 NOTE — Patient Instructions (Signed)

## 2023-11-15 NOTE — Telephone Encounter (Signed)
 Pharmacy Patient Advocate Encounter   Received notification from Pt Calls Messages that prior authorization for Ozempic  8mg  is required/requested.   Insurance verification completed.   The patient is insured through [facility] .   Per test claim: The current 30 day co-pay is, $0.  No PA needed at this time. This test claim was processed through [facility]- copay amounts may vary at other pharmacies due to pharmacy/plan contracts, or as the patient moves through the different stages of their insurance plan.     Insurance will only cover 30 days at a time

## 2023-11-23 ENCOUNTER — Ambulatory Visit: Payer: Self-pay

## 2023-11-23 NOTE — Telephone Encounter (Signed)
 FYI Only or Action Required?: FYI only for provider.  Patient was last seen in primary care on 02/22/2023 by Theophilus Andrews, Tully GRADE, MD.  Called Nurse Triage reporting head ache.  Symptoms began several weeks ago.  Interventions attempted: OTC medications: ibuprofen , bc powder.  Symptoms are: unchanged.  Triage Disposition: See PCP When Office is Open (Within 3 Days)  Patient/caregiver understands and will follow disposition?: Yes  Copied from CRM #[id number]. Topic: Clinical - Red Word Triage >> Nov 23, 2023  3:12 PM Charlet HERO wrote: Red Word that prompted transfer to Nurse Triage: Patient is stating that she has had a headache on the left side on her head for the last week, she is taking otc meds and they only help for about 3 hours she is also stating that she has been to her endocrinologist and her levels are fine but she can't get rid of the headache.Ozempic  was started back two weeks ago she was off of it for about a month. Reason for Disposition  [1] MILD-MODERATE headache AND [2] present > 3 days (72 hours) AND [3] no improvement after using Care Advice  Answer Assessment - Initial Assessment Questions 1. LOCATION: Where does it hurt?      Left side of head 2. ONSET: When did the headache start? (e.g., minutes, hours, days)      2-3 weeks ago 3. PATTERN: Does the pain come and go, or has it been constant since it started?     constant 4. SEVERITY: How bad is the pain? and What does it keep you from doing?  (e.g., Scale 1-10; mild, moderate, or severe)     At time of call 7/10 5. RECURRENT SYMPTOM: Have you ever had headaches before? If Yes, ask: When was the last time? and What happened that time?      History of bad headaches but not like this 6. CAUSE: What do you think is causing the headache?     Thought it may be related to sinuses, but now does not think so.  Also states she wears glasses and that they were part of the problem then 7. MIGRAINE: Have  you been diagnosed with migraine headaches? If Yes, ask: Is this headache similar?      History for migraine headaches 8. HEAD INJURY: Has there been any recent injury to your head?      denies 9. OTHER SYMPTOMS: Do you have any other symptoms? (e.g., fever, stiff neck, eye pain, sore throat, cold symptoms)     denies 10. PREGNANCY: Is there any chance you are pregnant? When was your last menstrual period?       N/A  States that she does not take her blood pressure medicine and that her blood pressure is never high.  States that at her recent visit with endo, blood pressure was not high  Protocols used: Headache-A-AH

## 2023-11-23 NOTE — Telephone Encounter (Signed)
 noted

## 2023-11-29 ENCOUNTER — Ambulatory Visit: Admitting: Family Medicine

## 2023-12-06 ENCOUNTER — Encounter: Payer: Self-pay | Admitting: Internal Medicine

## 2023-12-06 ENCOUNTER — Ambulatory Visit (INDEPENDENT_AMBULATORY_CARE_PROVIDER_SITE_OTHER): Admitting: Internal Medicine

## 2023-12-06 VITALS — BP 124/84 | Temp 98.1°F | Wt 221.7 lb

## 2023-12-06 DIAGNOSIS — E785 Hyperlipidemia, unspecified: Secondary | ICD-10-CM | POA: Diagnosis not present

## 2023-12-06 DIAGNOSIS — E1121 Type 2 diabetes mellitus with diabetic nephropathy: Secondary | ICD-10-CM

## 2023-12-06 DIAGNOSIS — R519 Headache, unspecified: Secondary | ICD-10-CM

## 2023-12-06 DIAGNOSIS — E119 Type 2 diabetes mellitus without complications: Secondary | ICD-10-CM | POA: Diagnosis not present

## 2023-12-06 DIAGNOSIS — Z7985 Long-term (current) use of injectable non-insulin antidiabetic drugs: Secondary | ICD-10-CM | POA: Diagnosis not present

## 2023-12-06 DIAGNOSIS — E1169 Type 2 diabetes mellitus with other specified complication: Secondary | ICD-10-CM

## 2023-12-06 DIAGNOSIS — Z1231 Encounter for screening mammogram for malignant neoplasm of breast: Secondary | ICD-10-CM

## 2023-12-06 MED ORDER — LISINOPRIL 5 MG PO TABS: 5.0000 mg | ORAL_TABLET | Freq: Every day | ORAL | 1 refills | Status: AC

## 2023-12-06 MED ORDER — CETIRIZINE HCL 10 MG PO TABS: 10.0000 mg | ORAL_TABLET | Freq: Every day | ORAL | 11 refills | Status: AC

## 2023-12-06 MED ORDER — ATORVASTATIN CALCIUM 80 MG PO TABS: 80.0000 mg | ORAL_TABLET | Freq: Every day | ORAL | 1 refills | Status: DC

## 2023-12-06 NOTE — Progress Notes (Signed)
 Established Patient Office Visit     CC/Reason for Visit: Headache, medication refills  HPI: Angelica Berg is a 48 y.o. female who is coming in today for the above mentioned reasons. Past Medical History is significant for: Headache that started about 3 weeks ago.  This coincides with when she resumed her Ozempic .  She also has significant fall allergies.  Headache is located over left frontal.  She has been having some issues with close-up vision.  But no blurry vision.  No focal neurologic deficits, no chest pain.   Past Medical/Surgical History: Past Medical History:  Diagnosis Date   Diabetes mellitus (HCC)    Dyslipidemia     Past Surgical History:  Procedure Laterality Date   CESAREAN SECTION     KNEE ARTHROCENTESIS      Social History:  reports that she quit smoking about 5 years ago. Her smoking use included cigarettes. She has never used smokeless tobacco. She reports current alcohol use. She reports current drug use. Frequency: 7.00 times per week. Drug: Marijuana.  Allergies: No Known Allergies  Family History:  Family History  Problem Relation Age of Onset   Diabetes Mother    Hypertension Mother    Cancer Maternal Grandmother        throat   Heart disease Maternal Grandmother      Current Outpatient Medications:    Blood Glucose Monitoring Suppl (ACCU-CHEK GUIDE) w/Device KIT, 1 Device by Does not apply route daily. E11.9, Disp: 1 kit, Rfl: 0   Blood Glucose Monitoring Suppl (ONETOUCH VERIO) w/Device KIT, 1 Device by Does not apply route as directed., Disp: 1 kit, Rfl: 0   cetirizine  (ZYRTEC ) 10 MG tablet, Take 1 tablet (10 mg total) by mouth daily., Disp: 30 tablet, Rfl: 11   glucose blood (ACCU-CHEK GUIDE) test strip, Use as instructed to test blood sugar 2 times daily E11.9, Disp: 100 each, Rfl: 12   ibuprofen  (ADVIL ) 800 MG tablet, TAKE 1 TABLET BY MOUTH EVERY 8 HOURS AS NEEDED, Disp: 90 tablet, Rfl: 0   meloxicam  (MOBIC ) 7.5 MG tablet, TAKE 1  TABLET BY MOUTH EVERY DAY, Disp: 30 tablet, Rfl: 0   metFORMIN  (GLUCOPHAGE ) 1000 MG tablet, Take 1 tablet (1,000 mg total) by mouth 2 (two) times daily with a meal., Disp: 60 tablet, Rfl: 3   Semaglutide , 2 MG/DOSE, (OZEMPIC , 2 MG/DOSE,) 8 MG/3ML SOPN, Inject 2 mg into the skin once a week., Disp: 9 mL, Rfl: 1   amLODipine  (NORVASC ) 5 MG tablet, Take 1 tablet (5 mg total) by mouth daily. (Patient not taking: Reported on 12/06/2023), Disp: 90 tablet, Rfl: 1   atorvastatin  (LIPITOR) 80 MG tablet, Take 1 tablet (80 mg total) by mouth daily., Disp: 90 tablet, Rfl: 1   estradiol  (ESTRACE ) 0.1 MG/GM vaginal cream, Insert one gram into vagina twice weekly. (Patient not taking: Reported on 12/06/2023), Disp: 42.5 g, Rfl: 2   lisinopril  (ZESTRIL ) 5 MG tablet, Take 1 tablet (5 mg total) by mouth daily., Disp: 90 tablet, Rfl: 1  Review of Systems:  Negative unless indicated in HPI.   Physical Exam: Vitals:   12/06/23 0830  BP: 124/84  Temp: 98.1 F (36.7 C)  TempSrc: Oral  Weight: 221 lb 11.2 oz (100.6 kg)    Body mass index is 32.74 kg/m.   Physical Exam Vitals reviewed.  Constitutional:      Appearance: Normal appearance. She is obese.  HENT:     Head: Normocephalic and atraumatic.  Eyes:  Conjunctiva/sclera: Conjunctivae normal.     Pupils: Pupils are equal, round, and reactive to light.  Cardiovascular:     Rate and Rhythm: Normal rate and regular rhythm.  Pulmonary:     Effort: Pulmonary effort is normal.     Breath sounds: Normal breath sounds.  Skin:    General: Skin is warm and dry.  Neurological:     General: No focal deficit present.     Mental Status: She is alert and oriented to person, place, and time.  Psychiatric:        Mood and Affect: Mood normal.        Behavior: Behavior normal.        Thought Content: Thought content normal.        Judgment: Judgment normal.      Impression and Plan:  Encounter for screening mammogram for malignant neoplasm of  breast -     3D Screening Mammogram, Left and Right; Future  Hyperlipidemia associated with type 2 diabetes mellitus (HCC) -     Atorvastatin  Calcium ; Take 1 tablet (80 mg total) by mouth daily.  Dispense: 90 tablet; Refill: 1  New onset type 2 diabetes mellitus (HCC) -     Lisinopril ; Take 1 tablet (5 mg total) by mouth daily.  Dispense: 90 tablet; Refill: 1  Type 2 diabetes mellitus with diabetic nephropathy, without long-term current use of insulin (HCC)  Acute nonintractable headache, unspecified headache type -     Cetirizine  HCl; Take 1 tablet (10 mg total) by mouth daily.  Dispense: 30 tablet; Refill: 11   - Suspect headache is due to worsening presbyopia versus sinus headache versus resumption of Ozempic .  Advised her to update her eye exam, will prescribe daily cetirizine  and she will monitor as she continues on Ozempic . - Refill lisinopril , atorvastatin . - Sent for screening mammogram that she is due for  Time spent:30 minutes reviewing chart, interviewing and examining patient and formulating plan of care.     Tully Theophilus Andrews, MD [facility]

## 2023-12-08 ENCOUNTER — Telehealth: Payer: Self-pay | Admitting: *Deleted

## 2023-12-08 DIAGNOSIS — E1169 Type 2 diabetes mellitus with other specified complication: Secondary | ICD-10-CM

## 2023-12-08 MED ORDER — ATORVASTATIN CALCIUM 80 MG PO TABS
80.0000 mg | ORAL_TABLET | Freq: Every day | ORAL | 1 refills | Status: AC
Start: 2023-12-08 — End: ?

## 2023-12-08 NOTE — Telephone Encounter (Signed)
 Copied from CRM #[id number]. Topic: Clinical - Medication Question >> Dec 08, 2023 10:42 AM Drema MATSU wrote: Reason for CRM: Patient stated that Atorvastatin  she picked up from [facility] has messed her stomach up and wants to know if Atorvastatin  can be sent to [facility]. 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  Refill sent.

## 2024-01-14 ENCOUNTER — Telehealth: Payer: Self-pay | Admitting: *Deleted

## 2024-01-14 NOTE — Progress Notes (Signed)
 Complex Care Management Care Guide Note  01/14/2024 Name: LANELL CARPENTER MRN: [id number] DOB: Sep 16, 1975  Rolinda JONETTA Ferries is a 48 y.o. year old female who is a primary care patient of Theophilus Andrews, Tully GRADE, MD and is actively engaged with the care management team. I reached out to Rolinda JONETTA Ferries by phone today to assist with re-scheduling  with the RN Case Manager.  Follow up plan: Unsuccessful telephone outreach attempt made. A HIPAA compliant phone message was left for the patient providing contact information and requesting a return call.  Thedford Franks, CMA [place]  [facility], [facility] Guide Direct Dial: [phone]  Fax: [phone] Website: [email or web address]

## 2024-01-18 NOTE — Progress Notes (Signed)
 Complex Care Management Care Guide Note  01/18/2024 Name: Angelica Berg MRN: [id number] DOB: Aug 12, 1975  Angelica Berg is a 48 y.o. year old female who is a primary care patient of Theophilus Andrews, Tully GRADE, MD and is actively engaged with the care management team. I reached out to Angelica Berg by phone today to assist with re-scheduling  with the RN Case Manager.  Follow up plan: Unsuccessful telephone outreach attempt made. A HIPAA compliant phone message was left for the patient providing contact information and requesting a return call. No further outreach attempts will be made due to inability to maintain patient contact.   Thedford Franks, CMA [place]  [facility], [facility] Guide Direct Dial: [phone]  Fax: [phone] Website: [email or web address]

## 2024-01-24 ENCOUNTER — Other Ambulatory Visit: Payer: Self-pay | Admitting: "Endocrinology

## 2024-01-24 DIAGNOSIS — E1140 Type 2 diabetes mellitus with diabetic neuropathy, unspecified: Secondary | ICD-10-CM

## 2024-02-06 ENCOUNTER — Other Ambulatory Visit: Payer: Self-pay | Admitting: "Endocrinology

## 2024-02-06 DIAGNOSIS — E1140 Type 2 diabetes mellitus with diabetic neuropathy, unspecified: Secondary | ICD-10-CM

## 2024-02-16 ENCOUNTER — Other Ambulatory Visit: Payer: Self-pay | Admitting: "Endocrinology

## 2024-02-16 ENCOUNTER — Other Ambulatory Visit

## 2024-02-16 ENCOUNTER — Encounter: Payer: Self-pay | Admitting: "Endocrinology

## 2024-02-16 DIAGNOSIS — E1140 Type 2 diabetes mellitus with diabetic neuropathy, unspecified: Secondary | ICD-10-CM

## 2024-02-16 LAB — LIPID PANEL
Cholesterol: 166 mg/dL (ref <200)
HDL: 57 mg/dL (ref >=50)
LDL Cholesterol (Calc): 77 mg/dL
Non-HDL Cholesterol (Calc): 109 mg/dL (ref <130)
Total CHOL/HDL Ratio: 2.9 (calc) (ref <5.0)
Triglycerides: 233 mg/dL — ABNORMAL HIGH (ref <150)

## 2024-02-16 LAB — HEMOGLOBIN A1C
Hgb A1c MFr Bld: 6.4 % — ABNORMAL HIGH (ref <5.7)
Mean Plasma Glucose: 137 mg/dL
eAG (mmol/L): 7.6 mmol/L

## 2024-02-21 ENCOUNTER — Ambulatory Visit (INDEPENDENT_AMBULATORY_CARE_PROVIDER_SITE_OTHER): Admitting: "Endocrinology

## 2024-02-21 ENCOUNTER — Encounter: Payer: Self-pay | Admitting: "Endocrinology

## 2024-02-21 VITALS — BP 100/80 | Ht 69.0 in | Wt 221.0 lb

## 2024-02-21 DIAGNOSIS — Z7984 Long term (current) use of oral hypoglycemic drugs: Secondary | ICD-10-CM

## 2024-02-21 DIAGNOSIS — E782 Mixed hyperlipidemia: Secondary | ICD-10-CM

## 2024-02-21 DIAGNOSIS — E1140 Type 2 diabetes mellitus with diabetic neuropathy, unspecified: Secondary | ICD-10-CM

## 2024-02-21 DIAGNOSIS — Z7985 Long-term (current) use of injectable non-insulin antidiabetic drugs: Secondary | ICD-10-CM

## 2024-02-21 LAB — POCT GLYCOSYLATED HEMOGLOBIN (HGB A1C): Hemoglobin A1C: 6.2 % — AB (ref 4.0–5.6)

## 2024-02-21 NOTE — Progress Notes (Signed)
 Outpatient Endocrinology Note Angelica Birmingham, MD  02/21/24   Angelica Berg [birth year] [id number]  Referring Provider: Theophilus Andrews, Jonna* Primary Care Provider: Theophilus Andrews, Tully GRADE, MD Reason for consultation: Subjective   Assessment & Plan  Diagnoses and all orders for this visit:  Type 2 diabetes mellitus with diabetic neuropathy, without long-term current use of insulin (HCC) -     POCT glycosylated hemoglobin (Hb A1C)  Long term (current) use of oral hypoglycemic drugs  Long-term (current) use of injectable non-insulin antidiabetic drugs  Mixed hypercholesterolemia and hypertriglyceridemia   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, neuropathy  Lab Results  Component Value Date   GFR 108.48 11/04/2022   Hba1c goal less than 7, current Hba1c is  Lab Results  Component Value Date   HGBA1C 6.2 (A) 02/21/2024   Will recommend the following: Ozempic  2 mg weekly Metformin  1000 mg at bedtime-max tolerate dose   Couldn't tolerate Metformin  XR 500 mg 2 pills twice a day due to diarrhea and abdominal pain, patient tolerates regular metformin  1000 mg better  08/16/23: Not interested in jardiance right now, discussed needs and benefits, patient wants to repeat MA/Cr in 6 mo and then decide    No known contraindications to any of above medications  -Last LD and Tg are as follows: Lab Results  Component Value Date   LDLCALC 77 02/16/2024    Lab Results  Component Value Date   TRIG 233 (H) 02/16/2024   -On atorvastatin  80 mg QD -Follow low fat diet and exercise   -Blood pressure goal <140/90 - Microalbumin/creatinine goal is < 30 -Last MA/Cr is as follows: Lab Results  Component Value Date   MICROALBUR 22.8 08/09/2023   -on ACE/ARB lisinopril  5 mg every day  -diet changes including salt restriction -limit eating outside -counseled BP targets per standards of diabetes care -uncontrolled blood pressure can lead to retinopathy,  nephropathy and cardiovascular and atherosclerotic heart disease  Reviewed and counseled on: -A1C target -Blood sugar targets -Complications of uncontrolled diabetes  -Checking blood sugar before meals and bedtime and bring log next visit -All medications with mechanism of action and side effects -Hypoglycemia management: rule of 15's, Glucagon Emergency Kit and medical alert ID -low-carb low-fat plate-method diet -At least 20 minutes of physical activity per day -Annual dilated retinal eye exam and foot exam -compliance and follow up needs -follow up as scheduled or earlier if problem gets worse  Call if blood sugar is less than 70 or consistently above 250    Take a 15 gm snack of carbohydrate at bedtime before you go to sleep if your blood sugar is less than 100.    If you are going to fast after midnight for a test or procedure, ask your physician for instructions on how to reduce/decrease your insulin dose.    Call if blood sugar is less than 70 or consistently above 250  -Treating a low sugar by rule of 15  (15 gms of sugar every 15 min until sugar is more than 70) If you feel your sugar is low, test your sugar to be sure If your sugar is low (less than 70), then take 15 grams of a fast acting Carbohydrate (3-4 glucose tablets or glucose gel or 4 ounces of juice or regular soda) Recheck your sugar 15 min after treating low to make sure it is more than 70 If sugar is still less than 70, treat again with 15 grams of carbohydrate  Don't drive the hour of hypoglycemia  If unconscious/unable to eat or drink by mouth, use glucagon injection or nasal spray baqsimi and call 911. Can repeat again in 15 min if still unconscious.  Return in about 3 months (around 05/23/2024).   I have reviewed current medications, nurse's notes, allergies, vital signs, past medical and surgical history, family medical history, and social history for this encounter. Counseled patient on symptoms,  examination findings, lab findings, imaging results, treatment decisions and monitoring and prognosis. The patient understood the recommendations and agrees with the treatment plan. All questions regarding treatment plan were fully answered.  Angelica Birmingham, MD  02/21/24   History of Present Illness Angelica Berg is a 48 y.o. year old female who presents for follow up of Type II diabetes mellitus.  Angelica Berg was first diagnosed in 07/2018.   Diabetes education +  Home diabetes regimen: Metformin  1000 mg at bedtime  Ozempic  2 mg weekly  Prior Medications tried/Intolerance: Started on Metformin  and Glipizide   Couldn't tolerate Metformin  XR 500 mg 2 pills twice a day due to diarrhea and abdominal pain, patient tolerates regular metformin  1000 mg better   COMPLICATIONS -  MI/Stroke -  retinopathy +  neuropathy -  nephropathy  BLOOD SUGAR DATA Did not bring meter Tires to check once a day alternating times Per recall, 111-199  Physical Exam  BP 100/80   Ht 5' 9 (1.753 m)   Wt 221 lb (100.2 kg)   LMP  (LMP Unknown)   BMI 32.64 kg/m    Constitutional: well developed, well nourished Head: normocephalic, atraumatic Eyes: sclera anicteric, no redness Neck: supple Lungs: normal respiratory effort Neurology: alert and oriented Skin: dry, no appreciable rashes Musculoskeletal: no appreciable defects Psychiatric: normal mood and affect Diabetic Foot Exam - Simple   No data filed     Current Medications Patient's Medications  New Prescriptions   No medications on file  Previous Medications   AMLODIPINE  (NORVASC ) 5 MG TABLET    Take 1 tablet (5 mg total) by mouth daily.   ATORVASTATIN  (LIPITOR) 80 MG TABLET    Take 1 tablet (80 mg total) by mouth daily.   BLOOD GLUCOSE MONITORING SUPPL (ACCU-CHEK GUIDE) W/DEVICE KIT    1 Device by Does not apply route daily. E11.9   BLOOD GLUCOSE MONITORING SUPPL (ONETOUCH VERIO) W/DEVICE KIT    1 Device by Does not apply route  as directed.   CETIRIZINE  (ZYRTEC ) 10 MG TABLET    Take 1 tablet (10 mg total) by mouth daily.   ESTRADIOL  (ESTRACE ) 0.1 MG/GM VAGINAL CREAM    Insert one gram into vagina twice weekly.   GLUCOSE BLOOD (ACCU-CHEK GUIDE) TEST STRIP    Use as instructed to test blood sugar 2 times daily E11.9   IBUPROFEN  (ADVIL ) 800 MG TABLET    TAKE 1 TABLET BY MOUTH EVERY 8 HOURS AS NEEDED   LISINOPRIL  (ZESTRIL ) 5 MG TABLET    Take 1 tablet (5 mg total) by mouth daily.   MELOXICAM  (MOBIC ) 7.5 MG TABLET    TAKE 1 TABLET BY MOUTH EVERY DAY   METFORMIN  (GLUCOPHAGE ) 1000 MG TABLET    Take 1 tablet (1,000 mg total) by mouth daily with breakfast.   SEMAGLUTIDE , 2 MG/DOSE, (OZEMPIC , 2 MG/DOSE,) 8 MG/3ML SOPN    Inject 2 mg into the skin once a week.  Modified Medications   No medications on file  Discontinued Medications   No medications on file    Allergies No Known Allergies  Past Medical History Past Medical History:  Diagnosis Date   Diabetes mellitus (HCC)    Dyslipidemia     Past Surgical History Past Surgical History:  Procedure Laterality Date   CESAREAN SECTION     KNEE ARTHROCENTESIS      Family History family history includes Cancer in her maternal grandmother; Diabetes in her mother; Heart disease in her maternal grandmother; Hypertension in her mother.  Social History Social History   Socioeconomic History   Marital status: Single    Spouse name: Not on file   Number of children: Not on file   Years of education: Not on file   Highest education level: Not on file  Occupational History   Not on file  Tobacco Use   Smoking status: Former    Current packs/day: 0.00    Types: Cigarettes    Quit date: 06/12/2018    Years since quitting: 5.6   Smokeless tobacco: Never  Vaping Use   Vaping status: Never Used  Substance and Sexual Activity   Alcohol use: Yes    Comment: occ   Drug use: Yes    Frequency: 7.0 times per week    Types: Marijuana   Sexual activity: Not Currently     Partners: Male    Birth control/protection: Post-menopausal  Other Topics Concern   Not on file  Social History Narrative   04/22/21- Single mom of 53 year old twin girls   Social Drivers of Corporate Investment Banker Strain: Low Risk  (07/08/2023)   Overall Financial Resource Strain (CARDIA)    Difficulty of Paying Living Expenses: Not very hard  Food Insecurity: No Food Insecurity (10/18/2023)   Hunger Vital Sign    Worried About Running Out of Food in the Last Year: Never true    Ran Out of Food in the Last Year: Never true  Transportation Needs: No Transportation Needs (10/18/2023)   PRAPARE - Administrator, Civil Service (Medical): No    Lack of Transportation (Non-Medical): No  Physical Activity: Inactive (05/12/2023)   Exercise Vital Sign    Days of Exercise per Week: 0 days    Minutes of Exercise per Session: 0 min  Stress: No Stress Concern Present (05/12/2023)   [employer] of Occupational Health - Occupational Stress Questionnaire    Feeling of Stress : Only a little  Social Connections: Socially Isolated (10/18/2023)   Social Connection and Isolation Panel    Frequency of Communication with Friends and Family: More than three times a week    Frequency of Social Gatherings with Friends and Family: More than three times a week    Attends Religious Services: Never    Database Administrator or Organizations: No    Attends Banker Meetings: Never    Marital Status: Never married  Intimate Partner Violence: Not At Risk (10/18/2023)   Humiliation, Afraid, Rape, and Kick questionnaire    Fear of Current or Ex-Partner: No    Emotionally Abused: No    Physically Abused: No    Sexually Abused: No    Lab Results  Component Value Date   HGBA1C 6.2 (A) 02/21/2024   HGBA1C 6.4 (H) 02/16/2024   HGBA1C 7.9 (A) 11/15/2023   Lab Results  Component Value Date   CHOL 166 02/16/2024   Lab Results  Component Value Date   HDL 57 02/16/2024   Lab  Results  Component Value Date   LDLCALC 77 02/16/2024   Lab Results  Component  Value Date   TRIG 233 (H) 02/16/2024   Lab Results  Component Value Date   CHOLHDL 2.9 02/16/2024   Lab Results  Component Value Date   CREATININE 0.65 08/09/2023   Lab Results  Component Value Date   GFR 108.48 11/04/2022   Lab Results  Component Value Date   MICROALBUR 22.8 08/09/2023      Component Value Date/Time   NA 139 08/09/2023 0833   K 4.2 08/09/2023 0833   CL 102 08/09/2023 0833   CO2 31 08/09/2023 0833   GLUCOSE 188 (H) 08/09/2023 0833   GLUCOSE 83 03/15/2008 0535   BUN 11 08/09/2023 0833   CREATININE 0.65 08/09/2023 0833   CALCIUM  9.4 08/09/2023 0833   PROT 7.2 08/09/2023 0833   ALBUMIN 4.3 11/04/2022 1006   AST 14 08/09/2023 0833   ALT 21 08/09/2023 0833   ALKPHOS 60 11/04/2022 1006   BILITOT 1.0 08/09/2023 0833   GFRNONAA >60 04/07/2008 0450   GFRAA  04/07/2008 0450    >60        The eGFR has been calculated using the MDRD equation. This calculation has not been validated in all clinical      Latest Ref Rng & Units 08/09/2023    8:33 AM 11/04/2022   10:06 AM 11/25/2021    9:29 AM  BMP  Glucose 65 - 99 mg/dL 811  784  672   BUN 7 - 25 mg/dL 11  7  7    Creatinine 0.50 - 0.99 mg/dL 9.34  9.39  9.28   BUN/Creat Ratio 6 - 22 (calc) SEE NOTE:     Sodium 135 - 146 mmol/L 139  139  137   Potassium 3.5 - 5.3 mmol/L 4.2  3.8  3.9   Chloride 98 - 110 mmol/L 102  103  98   CO2 20 - 32 mmol/L 31  27  31    Calcium  8.6 - 10.2 mg/dL 9.4  9.2  9.4        Component Value Date/Time   WBC 5.0 11/25/2021 0929   RBC 4.45 11/25/2021 0929   HGB 13.7 11/25/2021 0929   HCT 40.1 11/25/2021 0929   PLT 263.0 11/25/2021 0929   MCV 90.1 11/25/2021 0929   MCH 30.1 08/11/2018 1036   MCHC 34.1 11/25/2021 0929   RDW 12.9 11/25/2021 0929   LYMPHSABS 2.3 11/25/2021 0929   MONOABS 0.4 11/25/2021 0929   EOSABS 0.1 11/25/2021 0929   BASOSABS 0.0 11/25/2021 0929     Parts of this note  may have been dictated using voice recognition software. There may be variances in spelling and vocabulary which are unintentional. Not all errors are proofread. Please notify the dino if any discrepancies are noted or if the meaning of any statement is not clear.

## 2024-02-21 NOTE — Patient Instructions (Signed)

## 2024-02-28 LAB — OPHTHALMOLOGY REPORT-SCANNED

## 2024-04-27 ENCOUNTER — Ambulatory Visit: Admitting: Internal Medicine

## 2024-04-27 ENCOUNTER — Encounter: Payer: Self-pay | Admitting: Internal Medicine

## 2024-04-27 VITALS — BP 130/86 | Temp 98.0°F | Wt 225.3 lb

## 2024-04-27 DIAGNOSIS — L920 Granuloma annulare: Secondary | ICD-10-CM

## 2024-04-27 DIAGNOSIS — M25511 Pain in right shoulder: Secondary | ICD-10-CM | POA: Diagnosis not present

## 2024-04-27 DIAGNOSIS — M542 Cervicalgia: Secondary | ICD-10-CM

## 2024-04-27 DIAGNOSIS — M79601 Pain in right arm: Secondary | ICD-10-CM | POA: Diagnosis not present

## 2024-04-27 MED ORDER — TRIAMCINOLONE ACETONIDE 0.1 % EX CREA: 1.0000 | TOPICAL_CREAM | Freq: Two times a day (BID) | CUTANEOUS | 0 refills | Status: AC

## 2024-04-27 MED ORDER — MELOXICAM 7.5 MG PO TABS: 7.5000 mg | ORAL_TABLET | Freq: Every day | ORAL | 0 refills | Status: AC

## 2024-04-27 NOTE — Progress Notes (Signed)
 "    Established Patient Office Visit     CC/Reason for Visit: Continued right arm, shoulder, neck pain, skin lesions  HPI: Angelica Berg is a 49 y.o. female who is coming in today for the above mentioned reasons.  Here to discuss 2 main issues:  1.  Has had the appearance over the last month or so of round, scaly, reddish/brown, round lesions over her legs and torso, nonpruritic.  See below pictures  2.  She continues to have issues with her right arm, shoulder and neck.  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   Past Medical/Surgical History: Past Medical History:  Diagnosis Date   Diabetes mellitus (HCC)    Dyslipidemia     Past Surgical History:  Procedure Laterality Date   CESAREAN SECTION     KNEE ARTHROCENTESIS      Social History:  reports that she quit smoking about 5 years ago. Her smoking use included cigarettes. She has never used smokeless tobacco. She reports current alcohol use. She reports current drug use. Frequency: 7.00 times per week. Drug: Marijuana.  Allergies: Allergies[1]  Family History:  Family History  Problem Relation Age of Onset   Diabetes Mother    Hypertension Mother    Cancer Maternal Grandmother        throat   Heart disease Maternal Grandmother     Current Medications[2]  Review of Systems:  Negative unless indicated in HPI.   Physical Exam: Vitals:   04/27/24 0739  BP: 130/86  Temp: 98 F (36.7 C)  TempSrc: Oral  Weight: 225 lb 4.8 oz (102.2 kg)    Body mass index is 33.27 kg/m.   Physical Exam          Impression and Plan:  Granuloma annulare -     Triamcinolone  Acetonide; Apply 1 Application topically 2 (two) times daily.  Dispense: 453 g; Refill: 0  Cervicalgia  Right arm pain  Acute pain of right shoulder -     Meloxicam ; Take 1 tablet (7.5 mg total) by mouth daily.  Dispense: 30 tablet; Refill: 0 -     Ambulatory referral to Physical Therapy  Neck pain on right side -      Meloxicam ; Take 1 tablet (7.5 mg total) by mouth daily.  Dispense: 30 tablet; Refill: 0 -     Ambulatory referral to Physical Therapy   - Suspect skin lesions to be granuloma annulare.  Will give triamcinolone  cream to apply twice daily.  If no improvement can consider dermatology referral. - For cervicalgia and right arm pain we will give meloxicam  and place referral to physical therapy.  If no improvement will consider C-spine MRI in about 4 to 6 weeks.  Time spent:32 minutes reviewing chart, interviewing and examining patient and formulating plan of care.     Tully Theophilus Andrews, MD [facility]     [1] No Known Allergies [2]  Current Outpatient Medications:    atorvastatin  (LIPITOR) 80 MG tablet, Take 1 tablet (80 mg total) by mouth daily., Disp: 90 tablet, Rfl: 1   Blood Glucose Monitoring Suppl (ACCU-CHEK GUIDE) w/Device KIT, 1 Device by Does not apply route daily. E11.9, Disp: 1 kit, Rfl: 0   Blood Glucose Monitoring Suppl (ONETOUCH VERIO) w/Device KIT, 1 Device by Does not apply route as directed., Disp: 1 kit, Rfl: 0   cetirizine  (ZYRTEC ) 10 MG tablet, Take 1 tablet (10 mg total) by mouth daily., Disp: 30 tablet, Rfl: 11  glucose blood (ACCU-CHEK GUIDE) test strip, Use as instructed to test blood sugar 2 times daily E11.9, Disp: 100 each, Rfl: 12   ibuprofen  (ADVIL ) 800 MG tablet, TAKE 1 TABLET BY MOUTH EVERY 8 HOURS AS NEEDED, Disp: 90 tablet, Rfl: 0   lisinopril  (ZESTRIL ) 5 MG tablet, Take 1 tablet (5 mg total) by mouth daily., Disp: 90 tablet, Rfl: 1   metFORMIN  (GLUCOPHAGE ) 1000 MG tablet, Take 1 tablet (1,000 mg total) by mouth daily with breakfast., Disp: 90 tablet, Rfl: 1   Semaglutide , 2 MG/DOSE, (OZEMPIC , 2 MG/DOSE,) 8 MG/3ML SOPN, Inject 2 mg into the skin once a week., Disp: 9 mL, Rfl: 1   triamcinolone  cream (KENALOG ) 0.1 %, Apply 1 Application topically 2 (two) times daily., Disp: 453 g, Rfl: 0   amLODipine  (NORVASC ) 5 MG tablet, Take 1  tablet (5 mg total) by mouth daily. (Patient not taking: Reported on 12/06/2023), Disp: 90 tablet, Rfl: 1   meloxicam  (MOBIC ) 7.5 MG tablet, Take 1 tablet (7.5 mg total) by mouth daily., Disp: 30 tablet, Rfl: 0  "

## 2024-05-15 NOTE — Therapy (Incomplete)
 " OUTPATIENT PHYSICAL THERAPY CERVICAL / UPPER EXTREMITY EVALUATION   Patient Name: Angelica Berg MRN: [id number] DOB:06/08/1975, 49 y.o., female Today's Date: 05/15/2024  END OF SESSION:   Past Medical History:  Diagnosis Date   Diabetes mellitus (HCC)    Dyslipidemia    Past Surgical History:  Procedure Laterality Date   CESAREAN SECTION     KNEE ARTHROCENTESIS     Patient Active Problem List   Diagnosis Date Noted   Non-adherence to medical treatment 08/25/2022   Hypertension associated with diabetes (HCC) 02/25/2022   Pain in left foot 12/31/2021   Pain in left knee 05/23/2019   Type 2 diabetes mellitus with diabetic nephropathy, without long-term current use of insulin (HCC) 08/30/2018   Morbid obesity (HCC) 08/30/2018   Vitamin D  deficiency 08/30/2018   Hyperlipidemia associated with type 2 diabetes mellitus (HCC) 08/30/2018   Transaminitis 08/30/2018    PCP: Theophilus Andrews, Tully GRADE, MD  REFERRING PROVIDER: Theophilus Andrews, Tully GRADE, MD  REFERRING DIAG: M25.511 (ICD-10-CM) - Acute pain of right shoulder M54.2 (ICD-10-CM) - Neck pain on right side  THERAPY DIAG:  No diagnosis found.  Rationale for Evaluation and Treatment: Rehabilitation  ONSET DATE: ***  SUBJECTIVE:                                                                                                                                                                                                         SUBJECTIVE STATEMENT: *** Hand dominance: {MISC; OT HAND DOMINANCE:[phone]}  PERTINENT HISTORY:  DB  PAIN:  Are you having pain? Yes: NPRS scale: *** Pain location: *** Pain description: *** Aggravating factors: *** Relieving factors: ***  PRECAUTIONS: {Therapy precautions:24002}  RED FLAGS: {PT Red Flags:29287}     WEIGHT BEARING RESTRICTIONS: {Yes ***/No:24003}  FALLS:  Has patient fallen in last 6 months? {fallsyesno:27318}  LIVING ENVIRONMENT: Lives with: {[facility] lives  with:25569::lives with their family} Lives in: {Lives in:25570} Stairs: {opstairs:27293} Has following equipment at home: {Assistive devices:23999}  OCCUPATION: ***  PLOF: {PLOF:24004}  PATIENT GOALS: ***  NEXT MD VISIT: ***  OBJECTIVE:  Note: Objective measures were completed at Evaluation unless otherwise noted.  DIAGNOSTIC FINDINGS:  Rt Shoulder Xray 02/26/2023 FINDINGS: There is no evidence of fracture or dislocation. There is no evidence of arthropathy or other focal bone abnormality. Soft tissues are unremarkable.   IMPRESSION: Negative.  PATIENT SURVEYS:   {rehab surveys:24030}  COGNITION: Overall cognitive status: {cognition:24006}  SENSATION: {sensation:27233}  POSTURE: {posture:25561}  PALPATION: ***   CERVICAL ROM:   {AROM/PROM:27142} ROM A/PROM (deg) eval  Flexion  Extension   Right lateral flexion   Left lateral flexion   Right rotation   Left rotation    (Blank rows = not tested)  UPPER EXTREMITY ROM:  {AROM/PROM:27142} ROM Right eval Left eval  Shoulder flexion    Shoulder extension    Shoulder abduction    Shoulder adduction    Shoulder extension    Shoulder internal rotation    Shoulder external rotation    Elbow flexion    Elbow extension    Wrist flexion    Wrist extension    Wrist ulnar deviation    Wrist radial deviation    Wrist pronation    Wrist supination     (Blank rows = not tested)  UPPER EXTREMITY MMT:  MMT Right eval Left eval  Shoulder flexion    Shoulder extension    Shoulder abduction    Shoulder adduction    Shoulder extension    Shoulder internal rotation    Shoulder external rotation    Middle trapezius    Lower trapezius    Elbow flexion    Elbow extension    Wrist flexion    Wrist extension    Wrist ulnar deviation    Wrist radial deviation    Wrist pronation    Wrist supination    Grip strength     (Blank rows = not tested)  CERVICAL SPECIAL TESTS:  {Cervical special  tests:25246}  FUNCTIONAL TESTS:  {Functional tests:24029}  TREATMENT DATE: ***                                                                                                                                 PATIENT EDUCATION:  Education details: *** Person educated: {Person educated:25204} Education method: {Education Method:25205} Education comprehension: {Education Comprehension:25206}  HOME EXERCISE PROGRAM: ***  ASSESSMENT:  CLINICAL IMPRESSION: Patient is a *** y.o. *** who was seen today for physical therapy evaluation and treatment for ***.   OBJECTIVE IMPAIRMENTS: {opptimpairments:25111}.   ACTIVITY LIMITATIONS: {activitylimitations:27494}  PARTICIPATION LIMITATIONS: {participationrestrictions:25113}  PERSONAL FACTORS: {Personal factors:25162} are also affecting patient's functional outcome.   REHAB POTENTIAL: {rehabpotential:25112}  CLINICAL DECISION MAKING: {clinical decision making:25114}  EVALUATION COMPLEXITY: {Evaluation complexity:25115}   GOALS: Goals reviewed with patient? {yes/no:20286}  SHORT TERM GOALS: Target date: ***  *** Baseline:  Goal status: INITIAL  2.  *** Baseline:  Goal status: INITIAL  3.  *** Baseline:  Goal status: INITIAL  4.  *** Baseline:  Goal status: INITIAL  5.  *** Baseline:  Goal status: INITIAL  6.  *** Baseline:  Goal status: INITIAL  LONG TERM GOALS: Target date: ***  *** Baseline:  Goal status: INITIAL  2.  *** Baseline:  Goal status: INITIAL  3.  *** Baseline:  Goal status: INITIAL  4.  *** Baseline:  Goal status: INITIAL  5.  *** Baseline:  Goal status: INITIAL  6.  *** Baseline:  Goal status: INITIAL   PLAN:  PT FREQUENCY: {rehab frequency:25116}  PT DURATION: {rehab duration:25117}  PLANNED INTERVENTIONS: {rehab planned interventions:25118::97110-Therapeutic exercises,97530- Therapeutic [phone]- Neuromuscular re-education,97535- Self Rjmz,02859- Manual  therapy,Patient/Family education}  PLAN FOR NEXT SESSION: PIERRETTE Kristeen Sar, PT 05/15/2024, 1:59 PM      "

## 2024-05-16 ENCOUNTER — Ambulatory Visit: Admitting: Physical Therapy

## 2024-05-16 DIAGNOSIS — M542 Cervicalgia: Secondary | ICD-10-CM | POA: Insufficient documentation

## 2024-05-16 DIAGNOSIS — M6281 Muscle weakness (generalized): Secondary | ICD-10-CM | POA: Insufficient documentation

## 2024-05-16 DIAGNOSIS — M25541 Pain in joints of right hand: Secondary | ICD-10-CM | POA: Insufficient documentation

## 2024-05-19 ENCOUNTER — Ambulatory Visit: Admitting: Physical Therapy

## 2024-05-19 ENCOUNTER — Other Ambulatory Visit: Payer: Self-pay

## 2024-05-19 ENCOUNTER — Encounter: Payer: Self-pay | Admitting: Physical Therapy

## 2024-05-19 DIAGNOSIS — M6281 Muscle weakness (generalized): Secondary | ICD-10-CM | POA: Diagnosis present

## 2024-05-19 DIAGNOSIS — M25541 Pain in joints of right hand: Secondary | ICD-10-CM | POA: Diagnosis present

## 2024-05-19 DIAGNOSIS — M542 Cervicalgia: Secondary | ICD-10-CM | POA: Diagnosis present

## 2024-05-19 NOTE — Addendum Note (Signed)
 Addended by: Ikram Riebe J on: 05/19/2024 10:59 AM   Modules accepted: Orders

## 2024-05-22 ENCOUNTER — Other Ambulatory Visit (HOSPITAL_BASED_OUTPATIENT_CLINIC_OR_DEPARTMENT_OTHER): Payer: Self-pay

## 2024-05-24 ENCOUNTER — Encounter: Payer: Self-pay | Admitting: "Endocrinology

## 2024-05-24 ENCOUNTER — Other Ambulatory Visit

## 2024-05-24 ENCOUNTER — Ambulatory Visit: Admitting: "Endocrinology

## 2024-05-24 VITALS — BP 120/80 | Ht 69.0 in | Wt 226.0 lb

## 2024-05-24 DIAGNOSIS — Z7984 Long term (current) use of oral hypoglycemic drugs: Secondary | ICD-10-CM

## 2024-05-24 DIAGNOSIS — Z7985 Long-term (current) use of injectable non-insulin antidiabetic drugs: Secondary | ICD-10-CM

## 2024-05-24 DIAGNOSIS — E1140 Type 2 diabetes mellitus with diabetic neuropathy, unspecified: Secondary | ICD-10-CM

## 2024-05-24 DIAGNOSIS — E782 Mixed hyperlipidemia: Secondary | ICD-10-CM

## 2024-05-24 LAB — POCT GLYCOSYLATED HEMOGLOBIN (HGB A1C): Hemoglobin A1C: 6.9 % — AB (ref 4.0–5.6)

## 2024-05-24 MED ORDER — BLOOD GLUCOSE MONITORING SUPPL DEVI: 1.0000 | Freq: Three times a day (TID) | 0 refills | Status: AC

## 2024-05-24 MED ORDER — BLOOD GLUCOSE TEST VI STRP: 1.0000 | ORAL_STRIP | Freq: Three times a day (TID) | 3 refills | Status: AC

## 2024-05-24 MED ORDER — LANCETS MISC: 1.0000 | 0 refills | Status: AC

## 2024-05-24 MED ORDER — OZEMPIC (2 MG/DOSE) 8 MG/3ML ~~LOC~~ SOPN: 2.0000 mg | PEN_INJECTOR | SUBCUTANEOUS | 1 refills | Status: AC

## 2024-05-24 MED ORDER — LANCET DEVICE MISC: 1.0000 | Freq: Three times a day (TID) | 0 refills | Status: AC

## 2024-05-24 NOTE — Patient Instructions (Signed)

## 2024-05-24 NOTE — Progress Notes (Signed)
 "   Outpatient Endocrinology Note Obadiah Birmingham, MD  05/24/24   FAVOR KREH 28-Dec-1975 [id number]  Referring Provider: Theophilus Andrews, Tully GRADE, MD Primary Care Provider: Theophilus Andrews, Tully GRADE, MD Reason for consultation: Subjective   Assessment & Plan  Angelica Berg was seen today for diabetes.  Diagnoses and all orders for this visit:  Type 2 diabetes mellitus with diabetic neuropathy, without long-term current use of insulin (HCC) -     POCT glycosylated hemoglobin (Hb A1C) -     Blood Glucose Monitoring Suppl DEVI; 1 each by Does not apply route in the morning, at noon, and at bedtime. May substitute to any manufacturer covered by patient's insurance. -     Glucose Blood (BLOOD GLUCOSE TEST STRIPS) STRP; 1 each by In Vitro route in the morning, at noon, and at bedtime. May substitute to any manufacturer covered by patient's insurance. -     Lancet Device MISC; 1 each by Does not apply route in the morning, at noon, and at bedtime. May substitute to any manufacturer covered by patient's insurance. -     Lancets MISC; 1 each by Does not apply route as directed. Dispense based on patient and insurance preference. Use up to four times daily as directed. (FOR ICD-10 E10.9, E11.9). -     Microalbumin / creatinine urine ratio -     Lipid panel -     Comprehensive metabolic panel with GFR -     Ambulatory referral to Podiatry  Long term (current) use of oral hypoglycemic drugs  Long-term (current) use of injectable non-insulin antidiabetic drugs  Mixed hypercholesterolemia and hypertriglyceridemia  Uncontrolled type 2 diabetes mellitus with hyperglycemia (HCC) -     Semaglutide , 2 MG/DOSE, (OZEMPIC , 2 MG/DOSE,) 8 MG/3ML SOPN; Inject 2 mg into the skin once a week.   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, neuropathy  Lab Results  Component Value Date   GFR 108.48 11/04/2022   Hba1c goal less than 7, current Hba1c is  Lab Results  Component Value Date    HGBA1C 6.9 (A) 05/24/2024   Will recommend the following: Ozempic  2 mg weekly  Couldn't tolerate Metformin  XR 500 mg 2 pills twice a day due to diarrhea and abdominal pain and also stopped metformin  all together, patient tolerates regular metformin  1000 mg better  08/16/23: Not interested in jardiance right now, discussed needs and benefits, patient wants to repeat MA/Cr in 6 mo and then decide   No known contraindications to any of above medications  -Last LD and Tg are as follows: Lab Results  Component Value Date   LDLCALC 77 02/16/2024    Lab Results  Component Value Date   TRIG 233 (H) 02/16/2024   -stopped atorvastatin  80 mg every day, has no reasons  -Follow low fat diet and exercise   -Blood pressure goal <140/90 - Microalbumin/creatinine goal is < 30 -Last MA/Cr is as follows: Lab Results  Component Value Date   MICROALBUR 22.8 08/09/2023   -on ACE/ARB, self stopped lisinopril  5 mg every day  -diet changes including salt restriction -limit eating outside -counseled BP targets per standards of diabetes care -uncontrolled blood pressure can lead to retinopathy, nephropathy and cardiovascular and atherosclerotic heart disease  Reviewed and counseled on: -A1C target -Blood sugar targets -Complications of uncontrolled diabetes  -Checking blood sugar before meals and bedtime and bring log next visit -All medications with mechanism of action and side effects -Hypoglycemia management: rule of 15's, Glucagon Emergency Kit and medical alert ID -  low-carb low-fat plate-method diet -At least 20 minutes of physical activity per day -Annual dilated retinal eye exam and foot exam -compliance and follow up needs -follow up as scheduled or earlier if problem gets worse  Call if blood sugar is less than 70 or consistently above 250    Take a 15 gm snack of carbohydrate at bedtime before you go to sleep if your blood sugar is less than 100.    If you are going to fast  after midnight for a test or procedure, ask your physician for instructions on how to reduce/decrease your insulin dose.    Call if blood sugar is less than 70 or consistently above 250  -Treating a low sugar by rule of 15  (15 gms of sugar every 15 min until sugar is more than 70) If you feel your sugar is low, test your sugar to be sure If your sugar is low (less than 70), then take 15 grams of a fast acting Carbohydrate (3-4 glucose tablets or glucose gel or 4 ounces of juice or regular soda) Recheck your sugar 15 min after treating low to make sure it is more than 70 If sugar is still less than 70, treat again with 15 grams of carbohydrate          Don't drive the hour of hypoglycemia  If unconscious/unable to eat or drink by mouth, use glucagon injection or nasal spray baqsimi and call 911. Can repeat again in 15 min if still unconscious.  Return in about 4 months (around 09/21/2024).   I have reviewed current medications, nurse's notes, allergies, vital signs, past medical and surgical history, family medical history, and social history for this encounter. Counseled patient on symptoms, examination findings, lab findings, imaging results, treatment decisions and monitoring and prognosis. The patient understood the recommendations and agrees with the treatment plan. All questions regarding treatment plan were fully answered.  Obadiah Birmingham, MD  05/24/24   History of Present Illness Angelica Berg is a 49 y.o. year old female who presents for follow up of Type II diabetes mellitus.  Angelica Berg was first diagnosed in 07/2018.   Diabetes education +  Home diabetes regimen: Ozempic  2 mg weekly  Prior Medications tried/Intolerance: Started on Metformin  and Glipizide   Couldn't tolerate Metformin  XR 500 mg 2 pills twice a day due to diarrhea and abdominal pain, also reports she stopped metformin  all together Patient tolerates regular metformin  1000 mg better   COMPLICATIONS -   MI/Stroke -  retinopathy +  neuropathy -  nephropathy  BLOOD SUGAR DATA Did not bring meter Checks about every other day Per recall, 124-201  Physical Exam  BP 120/80   Ht 5' 9 (1.753 m)   Wt 226 lb (102.5 kg)   LMP  (LMP Unknown)   BMI 33.37 kg/m    Constitutional: well developed, well nourished Head: normocephalic, atraumatic Eyes: sclera anicteric, no redness Neck: supple Lungs: normal respiratory effort Neurology: alert and oriented Skin: dry, no appreciable rashes Musculoskeletal: no appreciable defects Psychiatric: normal mood and affect Diabetic Foot Exam - Simple   No data filed     Current Medications Patient's Medications  New Prescriptions   BLOOD GLUCOSE MONITORING SUPPL DEVI    1 each by Does not apply route in the morning, at noon, and at bedtime. May substitute to any manufacturer covered by patient's insurance.   GLUCOSE BLOOD (BLOOD GLUCOSE TEST STRIPS) STRP    1 each by In Vitro route in the  morning, at noon, and at bedtime. May substitute to any manufacturer covered by patient's insurance.   LANCET DEVICE MISC    1 each by Does not apply route in the morning, at noon, and at bedtime. May substitute to any manufacturer covered by patient's insurance.   LANCETS MISC    1 each by Does not apply route as directed. Dispense based on patient and insurance preference. Use up to four times daily as directed. (FOR ICD-10 E10.9, E11.9).  Previous Medications   AMLODIPINE  (NORVASC ) 5 MG TABLET    Take 1 tablet (5 mg total) by mouth daily.   ATORVASTATIN  (LIPITOR) 80 MG TABLET    Take 1 tablet (80 mg total) by mouth daily.   BLOOD GLUCOSE MONITORING SUPPL (ACCU-CHEK GUIDE) W/DEVICE KIT    1 Device by Does not apply route daily. E11.9   BLOOD GLUCOSE MONITORING SUPPL (ONETOUCH VERIO) W/DEVICE KIT    1 Device by Does not apply route as directed.   CETIRIZINE  (ZYRTEC ) 10 MG TABLET    Take 1 tablet (10 mg total) by mouth daily.   GLUCOSE BLOOD (ACCU-CHEK GUIDE) TEST  STRIP    Use as instructed to test blood sugar 2 times daily E11.9   IBUPROFEN  (ADVIL ) 800 MG TABLET    TAKE 1 TABLET BY MOUTH EVERY 8 HOURS AS NEEDED   LISINOPRIL  (ZESTRIL ) 5 MG TABLET    Take 1 tablet (5 mg total) by mouth daily.   MELOXICAM  (MOBIC ) 7.5 MG TABLET    Take 1 tablet (7.5 mg total) by mouth daily.   TRIAMCINOLONE  CREAM (KENALOG ) 0.1 %    Apply 1 Application topically 2 (two) times daily.  Modified Medications   Modified Medication Previous Medication   SEMAGLUTIDE , 2 MG/DOSE, (OZEMPIC , 2 MG/DOSE,) 8 MG/3ML SOPN Semaglutide , 2 MG/DOSE, (OZEMPIC , 2 MG/DOSE,) 8 MG/3ML SOPN      Inject 2 mg into the skin once a week.    Inject 2 mg into the skin once a week.  Discontinued Medications   METFORMIN  (GLUCOPHAGE ) 1000 MG TABLET    Take 1 tablet (1,000 mg total) by mouth daily with breakfast.    Allergies No Known Allergies  Past Medical History Past Medical History:  Diagnosis Date   Diabetes mellitus (HCC)    Dyslipidemia     Past Surgical History Past Surgical History:  Procedure Laterality Date   CESAREAN SECTION     KNEE ARTHROCENTESIS      Family History family history includes Cancer in her maternal grandmother; Diabetes in her mother; Heart disease in her maternal grandmother; Hypertension in her mother.  Social History Social History   Socioeconomic History   Marital status: Single    Spouse name: Not on file   Number of children: Not on file   Years of education: Not on file   Highest education level: Not on file  Occupational History   Not on file  Tobacco Use   Smoking status: Former    Current packs/day: 0.00    Types: Cigarettes    Quit date: 06/12/2018    Years since quitting: 5.9   Smokeless tobacco: Never  Vaping Use   Vaping status: Never Used  Substance and Sexual Activity   Alcohol use: Yes    Comment: occ   Drug use: Yes    Frequency: 7.0 times per week    Types: Marijuana   Sexual activity: Not Currently    Partners: Male    Birth  control/protection: Post-menopausal  Other Topics Concern  Not on file  Social History Narrative   04/22/21- Single mom of 98 year old twin girls   Social Drivers of Health   Tobacco Use: Medium Risk (05/24/2024)   Patient History    Smoking Tobacco Use: Former    Smokeless Tobacco Use: Never    Passive Exposure: Not on file  Financial Resource Strain: Low Risk (07/08/2023)   Overall Financial Resource Strain (CARDIA)    Difficulty of Paying Living Expenses: Not very hard  Food Insecurity: No Food Insecurity (10/18/2023)   Epic    Worried About Radiation Protection Practitioner of Food in the Last Year: Never true    Ran Out of Food in the Last Year: Never true  Transportation Needs: No Transportation Needs (10/18/2023)   Epic    Lack of Transportation (Medical): No    Lack of Transportation (Non-Medical): No  Physical Activity: Inactive (05/12/2023)   Exercise Vital Sign    Days of Exercise per Week: 0 days    Minutes of Exercise per Session: 0 min  Stress: No Stress Concern Present (05/12/2023)   [employer] of Occupational Health - Occupational Stress Questionnaire    Feeling of Stress : Only a little  Social Connections: Socially Isolated (10/18/2023)   Social Connection and Isolation Panel    Frequency of Communication with Friends and Family: More than three times a week    Frequency of Social Gatherings with Friends and Family: More than three times a week    Attends Religious Services: Never    Database Administrator or Organizations: No    Attends Banker Meetings: Never    Marital Status: Never married  Intimate Partner Violence: Not At Risk (10/18/2023)   Epic    Fear of Current or Ex-Partner: No    Emotionally Abused: No    Physically Abused: No    Sexually Abused: No  Depression (PHQ2-9): High Risk (04/27/2024)   Depression (PHQ2-9)    PHQ-2 Score: 11  Alcohol Screen: Low Risk (04/05/2023)   Alcohol Screen    Last Alcohol Screening Score (AUDIT): 0  Housing: Low Risk  (10/18/2023)   Epic    Unable to Pay for Housing in the Last Year: No    Number of Times Moved in the Last Year: 0    Homeless in the Last Year: No  Utilities: Not At Risk (10/18/2023)   Epic    Threatened with loss of utilities: No  Health Literacy: Adequate Health Literacy (10/18/2023)   B1300 Health Literacy    Frequency of need for help with medical instructions: Never    Lab Results  Component Value Date   HGBA1C 6.9 (A) 05/24/2024   HGBA1C 6.2 (A) 02/21/2024   HGBA1C 6.4 (H) 02/16/2024   Lab Results  Component Value Date   CHOL 166 02/16/2024   Lab Results  Component Value Date   HDL 57 02/16/2024   Lab Results  Component Value Date   LDLCALC 77 02/16/2024   Lab Results  Component Value Date   TRIG 233 (H) 02/16/2024   Lab Results  Component Value Date   CHOLHDL 2.9 02/16/2024   Lab Results  Component Value Date   CREATININE 0.65 08/09/2023   Lab Results  Component Value Date   GFR 108.48 11/04/2022   Lab Results  Component Value Date   MICROALBUR 22.8 08/09/2023      Component Value Date/Time   NA 139 08/09/2023 0833   K 4.2 08/09/2023 0833   CL 102 08/09/2023 9166  CO2 31 08/09/2023 0833   GLUCOSE 188 (H) 08/09/2023 0833   GLUCOSE 83 03/15/2008 0535   BUN 11 08/09/2023 0833   CREATININE 0.65 08/09/2023 0833   CALCIUM  9.4 08/09/2023 0833   PROT 7.2 08/09/2023 0833   ALBUMIN 4.3 11/04/2022 1006   AST 14 08/09/2023 0833   ALT 21 08/09/2023 0833   ALKPHOS 60 11/04/2022 1006   BILITOT 1.0 08/09/2023 0833   GFRNONAA >60 04/07/2008 0450   GFRAA  04/07/2008 0450    >60        The eGFR has been calculated using the MDRD equation. This calculation has not been validated in all clinical      Latest Ref Rng & Units 08/09/2023    8:33 AM 11/04/2022   10:06 AM 11/25/2021    9:29 AM  BMP  Glucose 65 - 99 mg/dL 811  784  672   BUN 7 - 25 mg/dL 11  7  7    Creatinine 0.50 - 0.99 mg/dL 9.34  9.39  9.28   BUN/Creat Ratio 6 - 22 (calc) SEE NOTE:      Sodium 135 - 146 mmol/L 139  139  137   Potassium 3.5 - 5.3 mmol/L 4.2  3.8  3.9   Chloride 98 - 110 mmol/L 102  103  98   CO2 20 - 32 mmol/L 31  27  31    Calcium  8.6 - 10.2 mg/dL 9.4  9.2  9.4        Component Value Date/Time   WBC 5.0 11/25/2021 0929   RBC 4.45 11/25/2021 0929   HGB 13.7 11/25/2021 0929   HCT 40.1 11/25/2021 0929   PLT 263.0 11/25/2021 0929   MCV 90.1 11/25/2021 0929   MCH 30.1 08/11/2018 1036   MCHC 34.1 11/25/2021 0929   RDW 12.9 11/25/2021 0929   LYMPHSABS 2.3 11/25/2021 0929   MONOABS 0.4 11/25/2021 0929   EOSABS 0.1 11/25/2021 0929   BASOSABS 0.0 11/25/2021 0929     Parts of this note may have been dictated using voice recognition software. There may be variances in spelling and vocabulary which are unintentional. Not all errors are proofread. Please notify the dino if any discrepancies are noted or if the meaning of any statement is not clear.   "

## 2024-05-25 ENCOUNTER — Ambulatory Visit: Payer: Self-pay | Admitting: "Endocrinology

## 2024-05-25 LAB — COMPREHENSIVE METABOLIC PANEL WITH GFR
AG Ratio: 1.4 (calc) (ref 1.0–2.5)
ALT: 18 U/L (ref 6–29)
AST: 14 U/L (ref 10–35)
Albumin: 4.3 g/dL (ref 3.6–5.1)
Alkaline phosphatase (APISO): 53 U/L (ref 31–125)
BUN: 12 mg/dL (ref 7–25)
CO2: 29 mmol/L (ref 20–32)
Calcium: 9.5 mg/dL (ref 8.6–10.2)
Chloride: 101 mmol/L (ref 98–110)
Creat: 0.65 mg/dL (ref 0.50–0.99)
Globulin: 3 g/dL (ref 1.9–3.7)
Glucose, Bld: 150 mg/dL — ABNORMAL HIGH (ref 65–99)
Potassium: 4.2 mmol/L (ref 3.5–5.3)
Sodium: 138 mmol/L (ref 135–146)
Total Bilirubin: 1.3 mg/dL — ABNORMAL HIGH (ref 0.2–1.2)
Total Protein: 7.3 g/dL (ref 6.1–8.1)
eGFR: 110 mL/min/{1.73_m2}

## 2024-05-25 LAB — LIPID PANEL
Cholesterol: 260 mg/dL — ABNORMAL HIGH
HDL: 59 mg/dL
LDL Cholesterol (Calc): 160 mg/dL — ABNORMAL HIGH
Non-HDL Cholesterol (Calc): 201 mg/dL — ABNORMAL HIGH
Total CHOL/HDL Ratio: 4.4 (calc)
Triglycerides: 249 mg/dL — ABNORMAL HIGH

## 2024-05-25 LAB — MICROALBUMIN / CREATININE URINE RATIO
Creatinine, Urine: 146 mg/dL (ref 20–275)
Microalb Creat Ratio: 14 mg/g{creat}
Microalb, Ur: 2.1 mg/dL

## 2024-05-29 ENCOUNTER — Ambulatory Visit: Admitting: Physical Therapy

## 2024-06-05 ENCOUNTER — Ambulatory Visit: Admitting: Physical Therapy

## 2024-06-12 ENCOUNTER — Ambulatory Visit: Admitting: Physical Therapy

## 2024-06-19 ENCOUNTER — Ambulatory Visit: Admitting: Physical Therapy

## 2024-06-26 ENCOUNTER — Ambulatory Visit: Admitting: Physical Therapy

## 2024-07-03 ENCOUNTER — Ambulatory Visit: Admitting: Physical Therapy

## 2024-07-10 ENCOUNTER — Ambulatory Visit: Admitting: Physical Therapy

## 2024-09-27 ENCOUNTER — Ambulatory Visit: Admitting: "Endocrinology
